# Patient Record
Sex: Female | Born: 1970 | Hispanic: No | Smoking: Current every day smoker
Health system: Southern US, Community
[De-identification: ages and names within clinical notes are randomized; demographics above are authoritative.]

## PROBLEM LIST (undated history)

## (undated) DIAGNOSIS — Z720 Tobacco use: Secondary | ICD-10-CM

## (undated) DIAGNOSIS — E119 Type 2 diabetes mellitus without complications: Secondary | ICD-10-CM

## (undated) DIAGNOSIS — F419 Anxiety disorder, unspecified: Secondary | ICD-10-CM

## (undated) DIAGNOSIS — I2510 Atherosclerotic heart disease of native coronary artery without angina pectoris: Secondary | ICD-10-CM

## (undated) DIAGNOSIS — I219 Acute myocardial infarction, unspecified: Secondary | ICD-10-CM

## (undated) DIAGNOSIS — E785 Hyperlipidemia, unspecified: Secondary | ICD-10-CM

## (undated) HISTORY — PX: CARDIAC CATHETERIZATION: SHX172

## (undated) HISTORY — PX: OTHER SURGICAL HISTORY: SHX169

---

## 2019-03-23 ENCOUNTER — Ambulatory Visit (HOSPITAL_COMMUNITY): Admit: 2019-03-23 | Payer: Medicaid Other | Admitting: Cardiovascular Disease

## 2019-03-23 ENCOUNTER — Encounter (HOSPITAL_COMMUNITY): Payer: Self-pay | Admitting: Cardiovascular Disease

## 2019-03-23 ENCOUNTER — Emergency Department (HOSPITAL_COMMUNITY): Payer: Medicaid Other

## 2019-03-23 ENCOUNTER — Inpatient Hospital Stay (HOSPITAL_COMMUNITY)
Admission: EM | Admit: 2019-03-23 | Discharge: 2019-03-30 | DRG: 234 | Disposition: A | Discharge status: Rehabilitation Facility | Payer: Medicaid Other | Attending: Cardiothoracic Surgery | Admitting: Cardiothoracic Surgery

## 2019-03-23 ENCOUNTER — Encounter (HOSPITAL_COMMUNITY)
Admission: EM | Disposition: A | Discharge status: Rehabilitation Facility | Payer: Self-pay | Source: Home / Self Care | Attending: Cardiothoracic Surgery

## 2019-03-23 ENCOUNTER — Inpatient Hospital Stay (HOSPITAL_COMMUNITY): Payer: Medicaid Other

## 2019-03-23 ENCOUNTER — Other Ambulatory Visit: Payer: Self-pay | Admitting: *Deleted

## 2019-03-23 DIAGNOSIS — I97821 Postprocedural cerebrovascular infarction during other surgery: Secondary | ICD-10-CM | POA: Diagnosis not present

## 2019-03-23 DIAGNOSIS — Z4659 Encounter for fitting and adjustment of other gastrointestinal appliance and device: Secondary | ICD-10-CM

## 2019-03-23 DIAGNOSIS — F17210 Nicotine dependence, cigarettes, uncomplicated: Secondary | ICD-10-CM | POA: Diagnosis present

## 2019-03-23 DIAGNOSIS — Z20828 Contact with and (suspected) exposure to other viral communicable diseases: Secondary | ICD-10-CM | POA: Diagnosis present

## 2019-03-23 DIAGNOSIS — M7989 Other specified soft tissue disorders: Secondary | ICD-10-CM | POA: Diagnosis not present

## 2019-03-23 DIAGNOSIS — I2510 Atherosclerotic heart disease of native coronary artery without angina pectoris: Secondary | ICD-10-CM

## 2019-03-23 DIAGNOSIS — Z951 Presence of aortocoronary bypass graft: Secondary | ICD-10-CM

## 2019-03-23 DIAGNOSIS — I82A12 Acute embolism and thrombosis of left axillary vein: Secondary | ICD-10-CM | POA: Diagnosis not present

## 2019-03-23 DIAGNOSIS — G8191 Hemiplegia, unspecified affecting right dominant side: Secondary | ICD-10-CM | POA: Diagnosis not present

## 2019-03-23 DIAGNOSIS — Z419 Encounter for procedure for purposes other than remedying health state, unspecified: Secondary | ICD-10-CM

## 2019-03-23 DIAGNOSIS — R7309 Other abnormal glucose: Secondary | ICD-10-CM | POA: Diagnosis not present

## 2019-03-23 DIAGNOSIS — E669 Obesity, unspecified: Secondary | ICD-10-CM | POA: Diagnosis present

## 2019-03-23 DIAGNOSIS — E6609 Other obesity due to excess calories: Secondary | ICD-10-CM | POA: Diagnosis not present

## 2019-03-23 DIAGNOSIS — E1151 Type 2 diabetes mellitus with diabetic peripheral angiopathy without gangrene: Secondary | ICD-10-CM | POA: Diagnosis present

## 2019-03-23 DIAGNOSIS — E8809 Other disorders of plasma-protein metabolism, not elsewhere classified: Secondary | ICD-10-CM | POA: Diagnosis not present

## 2019-03-23 DIAGNOSIS — R0683 Snoring: Secondary | ICD-10-CM | POA: Diagnosis not present

## 2019-03-23 DIAGNOSIS — I2119 ST elevation (STEMI) myocardial infarction involving other coronary artery of inferior wall: Secondary | ICD-10-CM

## 2019-03-23 DIAGNOSIS — Z6832 Body mass index (BMI) 32.0-32.9, adult: Secondary | ICD-10-CM

## 2019-03-23 DIAGNOSIS — R52 Pain, unspecified: Secondary | ICD-10-CM | POA: Diagnosis not present

## 2019-03-23 DIAGNOSIS — K5901 Slow transit constipation: Secondary | ICD-10-CM | POA: Diagnosis not present

## 2019-03-23 DIAGNOSIS — Z8249 Family history of ischemic heart disease and other diseases of the circulatory system: Secondary | ICD-10-CM

## 2019-03-23 DIAGNOSIS — I25110 Atherosclerotic heart disease of native coronary artery with unstable angina pectoris: Secondary | ICD-10-CM | POA: Diagnosis not present

## 2019-03-23 DIAGNOSIS — Z01810 Encounter for preprocedural cardiovascular examination: Secondary | ICD-10-CM

## 2019-03-23 DIAGNOSIS — E1142 Type 2 diabetes mellitus with diabetic polyneuropathy: Secondary | ICD-10-CM | POA: Diagnosis present

## 2019-03-23 DIAGNOSIS — R079 Chest pain, unspecified: Secondary | ICD-10-CM | POA: Diagnosis present

## 2019-03-23 DIAGNOSIS — I639 Cerebral infarction, unspecified: Secondary | ICD-10-CM

## 2019-03-23 DIAGNOSIS — I69319 Unspecified symptoms and signs involving cognitive functions following cerebral infarction: Secondary | ICD-10-CM

## 2019-03-23 DIAGNOSIS — D62 Acute posthemorrhagic anemia: Secondary | ICD-10-CM

## 2019-03-23 DIAGNOSIS — R4189 Other symptoms and signs involving cognitive functions and awareness: Secondary | ICD-10-CM | POA: Diagnosis present

## 2019-03-23 DIAGNOSIS — E785 Hyperlipidemia, unspecified: Secondary | ICD-10-CM | POA: Diagnosis present

## 2019-03-23 DIAGNOSIS — R609 Edema, unspecified: Secondary | ICD-10-CM | POA: Diagnosis not present

## 2019-03-23 DIAGNOSIS — I10 Essential (primary) hypertension: Secondary | ICD-10-CM

## 2019-03-23 DIAGNOSIS — E118 Type 2 diabetes mellitus with unspecified complications: Secondary | ICD-10-CM | POA: Diagnosis not present

## 2019-03-23 DIAGNOSIS — G479 Sleep disorder, unspecified: Secondary | ICD-10-CM | POA: Diagnosis not present

## 2019-03-23 DIAGNOSIS — I6300 Cerebral infarction due to thrombosis of unspecified precerebral artery: Secondary | ICD-10-CM | POA: Diagnosis not present

## 2019-03-23 DIAGNOSIS — E1165 Type 2 diabetes mellitus with hyperglycemia: Secondary | ICD-10-CM | POA: Diagnosis not present

## 2019-03-23 DIAGNOSIS — Z794 Long term (current) use of insulin: Secondary | ICD-10-CM | POA: Diagnosis not present

## 2019-03-23 DIAGNOSIS — D696 Thrombocytopenia, unspecified: Secondary | ICD-10-CM | POA: Diagnosis not present

## 2019-03-23 DIAGNOSIS — Z720 Tobacco use: Secondary | ICD-10-CM | POA: Diagnosis not present

## 2019-03-23 DIAGNOSIS — E46 Unspecified protein-calorie malnutrition: Secondary | ICD-10-CM | POA: Diagnosis not present

## 2019-03-23 DIAGNOSIS — I219 Acute myocardial infarction, unspecified: Secondary | ICD-10-CM | POA: Diagnosis not present

## 2019-03-23 DIAGNOSIS — R252 Cramp and spasm: Secondary | ICD-10-CM | POA: Diagnosis not present

## 2019-03-23 DIAGNOSIS — E871 Hypo-osmolality and hyponatremia: Secondary | ICD-10-CM | POA: Diagnosis not present

## 2019-03-23 HISTORY — PX: LEFT HEART CATH AND CORONARY ANGIOGRAPHY: CATH118249

## 2019-03-23 HISTORY — DX: Acute myocardial infarction, unspecified: I21.9

## 2019-03-23 HISTORY — DX: Atherosclerotic heart disease of native coronary artery without angina pectoris: I25.10

## 2019-03-23 HISTORY — DX: Anxiety disorder, unspecified: F41.9

## 2019-03-23 HISTORY — DX: Tobacco use: Z72.0

## 2019-03-23 HISTORY — DX: Type 2 diabetes mellitus without complications: E11.9

## 2019-03-23 HISTORY — DX: Hyperlipidemia, unspecified: E78.5

## 2019-03-23 LAB — LIPID PANEL
Cholesterol: 276 mg/dL — ABNORMAL HIGH (ref 0–200)
HDL: 35 mg/dL — ABNORMAL LOW (ref >40)
LDL Cholesterol: 201 mg/dL — ABNORMAL HIGH (ref 0–99)
Total CHOL/HDL Ratio: 7.9 RATIO
Triglycerides: 200 mg/dL — ABNORMAL HIGH (ref <150)
VLDL: 40 mg/dL (ref 0–40)

## 2019-03-23 LAB — URINALYSIS, ROUTINE W REFLEX MICROSCOPIC
Bilirubin Urine: NEGATIVE
Glucose, UA: >=500 mg/dL — AB
Hgb urine dipstick: NEGATIVE
Ketones, ur: 5 mg/dL — AB
Nitrite: NEGATIVE
Protein, ur: NEGATIVE mg/dL
Specific Gravity, Urine: >1.046 — ABNORMAL HIGH (ref 1.005–1.030)
pH: 7 (ref 5.0–8.0)

## 2019-03-23 LAB — COMPREHENSIVE METABOLIC PANEL
ALT: 17 U/L (ref 0–44)
ALT: 18 U/L (ref 0–44)
AST: 16 U/L (ref 15–41)
AST: 33 U/L (ref 15–41)
Albumin: 3.7 g/dL (ref 3.5–5.0)
Albumin: 3.5 g/dL (ref 3.5–5.0)
Alkaline Phosphatase: 83 U/L (ref 38–126)
Alkaline Phosphatase: 80 U/L (ref 38–126)
Anion gap: 12 (ref 5–15)
Anion gap: 13 (ref 5–15)
BUN: 16 mg/dL (ref 6–20)
BUN: 15 mg/dL (ref 6–20)
CO2: 19 mmol/L — ABNORMAL LOW (ref 22–32)
CO2: 19 mmol/L — ABNORMAL LOW (ref 22–32)
Calcium: 8.9 mg/dL (ref 8.9–10.3)
Calcium: 8.8 mg/dL — ABNORMAL LOW (ref 8.9–10.3)
Chloride: 100 mmol/L (ref 98–111)
Chloride: 103 mmol/L (ref 98–111)
Creatinine, Ser: 0.73 mg/dL (ref 0.44–1.00)
Creatinine, Ser: 0.64 mg/dL (ref 0.44–1.00)
GFR calc Af Amer: >60 mL/min (ref >60)
GFR calc Af Amer: >60 mL/min (ref >60)
GFR calc non Af Amer: >60 mL/min (ref >60)
GFR calc non Af Amer: >60 mL/min (ref >60)
Glucose, Bld: 338 mg/dL — ABNORMAL HIGH (ref 70–99)
Glucose, Bld: 266 mg/dL — ABNORMAL HIGH (ref 70–99)
Potassium: 4.3 mmol/L (ref 3.5–5.1)
Potassium: 4.5 mmol/L (ref 3.5–5.1)
Sodium: 131 mmol/L — ABNORMAL LOW (ref 135–145)
Sodium: 135 mmol/L (ref 135–145)
Total Bilirubin: 0.3 mg/dL (ref 0.3–1.2)
Total Bilirubin: 0.4 mg/dL (ref 0.3–1.2)
Total Protein: 6.5 g/dL (ref 6.5–8.1)
Total Protein: 6.2 g/dL — ABNORMAL LOW (ref 6.5–8.1)

## 2019-03-23 LAB — CBC
HCT: 41.8 % (ref 36.0–46.0)
Hemoglobin: 14.5 g/dL (ref 12.0–15.0)
MCH: 28.8 pg (ref 26.0–34.0)
MCHC: 34.7 g/dL (ref 30.0–36.0)
MCV: 82.9 fL (ref 80.0–100.0)
Platelets: 344 10*3/uL (ref 150–400)
RBC: 5.04 MIL/uL (ref 3.87–5.11)
RDW: 12.7 % (ref 11.5–15.5)
WBC: 14.3 10*3/uL — ABNORMAL HIGH (ref 4.0–10.5)
nRBC: 0 % (ref 0.0–0.2)

## 2019-03-23 LAB — APTT: aPTT: 27 seconds (ref 24–36)

## 2019-03-23 LAB — GLUCOSE, CAPILLARY
Glucose-Capillary: 314 mg/dL — ABNORMAL HIGH (ref 70–99)
Glucose-Capillary: 194 mg/dL — ABNORMAL HIGH (ref 70–99)
Glucose-Capillary: 279 mg/dL — ABNORMAL HIGH (ref 70–99)
Glucose-Capillary: 206 mg/dL — ABNORMAL HIGH (ref 70–99)

## 2019-03-23 LAB — POCT I-STAT, CHEM 8
BUN: 17 mg/dL (ref 6–20)
Calcium, Ion: 1.22 mmol/L (ref 1.15–1.40)
Chloride: 101 mmol/L (ref 98–111)
Creatinine, Ser: 0.5 mg/dL (ref 0.44–1.00)
Glucose, Bld: 354 mg/dL — ABNORMAL HIGH (ref 70–99)
HCT: 44 % (ref 36.0–46.0)
Hemoglobin: 15 g/dL (ref 12.0–15.0)
Potassium: 4.5 mmol/L (ref 3.5–5.1)
Sodium: 134 mmol/L — ABNORMAL LOW (ref 135–145)
TCO2: 22 mmol/L (ref 22–32)

## 2019-03-23 LAB — PROTIME-INR
INR: 1 (ref 0.8–1.2)
Prothrombin Time: 13.1 seconds (ref 11.4–15.2)

## 2019-03-23 LAB — SURGICAL PCR SCREEN
MRSA, PCR: NEGATIVE
Staphylococcus aureus: NEGATIVE

## 2019-03-23 LAB — TROPONIN I (HIGH SENSITIVITY): Troponin I (High Sensitivity): 24 ng/L — ABNORMAL HIGH (ref <18)

## 2019-03-23 LAB — ECHOCARDIOGRAM COMPLETE: Weight: 2848 oz

## 2019-03-23 LAB — SARS CORONAVIRUS 2 BY RT PCR (HOSPITAL ORDER, PERFORMED IN ~~LOC~~ HOSPITAL LAB): SARS Coronavirus 2: NEGATIVE

## 2019-03-23 LAB — HEMOGLOBIN A1C
Hgb A1c MFr Bld: 8.3 % — ABNORMAL HIGH (ref 4.8–5.6)
Hgb A1c MFr Bld: 8.3 % — ABNORMAL HIGH (ref 4.8–5.6)
Mean Plasma Glucose: 191.51 mg/dL
Mean Plasma Glucose: 191.51 mg/dL

## 2019-03-23 LAB — TSH: TSH: 1.094 u[IU]/mL (ref 0.350–4.500)

## 2019-03-23 LAB — MAGNESIUM: Magnesium: 1.8 mg/dL (ref 1.7–2.4)

## 2019-03-23 LAB — ABO/RH: ABO/RH(D): O NEG

## 2019-03-23 LAB — HEPARIN LEVEL (UNFRACTIONATED): Heparin Unfractionated: <0.1 IU/mL — ABNORMAL LOW (ref 0.30–0.70)

## 2019-03-23 SURGERY — LEFT HEART CATH AND CORONARY ANGIOGRAPHY
Anesthesia: LOCAL

## 2019-03-23 MED ORDER — ALPRAZOLAM 0.5 MG PO TABS
0.5000 mg | ORAL_TABLET | Freq: Three times a day (TID) | ORAL | Status: DC | PRN
  Administered 2019-03-23 – 2019-03-24 (×4): 0.5 mg via ORAL
  Filled 2019-03-23 (×4): qty 1

## 2019-03-23 MED ORDER — INSULIN ASPART 100 UNIT/ML ~~LOC~~ SOLN
0.0000 [IU] | Freq: Every day | SUBCUTANEOUS | Status: DC
  Administered 2019-03-23 – 2019-03-24 (×2): 2 [IU] via SUBCUTANEOUS

## 2019-03-23 MED ORDER — SODIUM CHLORIDE 0.9 % IV SOLN
0.7500 ug/kg/min | INTRAVENOUS | Status: DC
  Administered 2019-03-24: 0.75 ug/kg/min via INTRAVENOUS
  Filled 2019-03-23 (×2): qty 50

## 2019-03-23 MED ORDER — LIDOCAINE HCL (PF) 1 % IJ SOLN
INTRAMUSCULAR | Status: AC
  Filled 2019-03-23: qty 30

## 2019-03-23 MED ORDER — NITROGLYCERIN 1 MG/10 ML FOR IR/CATH LAB
INTRA_ARTERIAL | Status: AC
  Filled 2019-03-23: qty 10

## 2019-03-23 MED ORDER — HEPARIN SODIUM (PORCINE) 1000 UNIT/ML IJ SOLN
INTRAMUSCULAR | Status: AC
  Filled 2019-03-23: qty 1

## 2019-03-23 MED ORDER — FENTANYL CITRATE (PF) 100 MCG/2ML IJ SOLN
INTRAMUSCULAR | Status: DC | PRN
  Administered 2019-03-23 (×2): 25 ug via INTRAVENOUS

## 2019-03-23 MED ORDER — HEPARIN (PORCINE) IN NACL 1000-0.9 UT/500ML-% IV SOLN
INTRAVENOUS | Status: AC
  Filled 2019-03-23: qty 1000

## 2019-03-23 MED ORDER — MOMETASONE FURO-FORMOTEROL FUM 100-5 MCG/ACT IN AERO
2.0000 | INHALATION_SPRAY | Freq: Two times a day (BID) | RESPIRATORY_TRACT | Status: DC
  Administered 2019-03-23 – 2019-03-25 (×4): 2 via RESPIRATORY_TRACT
  Filled 2019-03-23: qty 8.8

## 2019-03-23 MED ORDER — CANGRELOR TETRASODIUM 50 MG IV SOLR
INTRAVENOUS | Status: AC
  Filled 2019-03-23: qty 50

## 2019-03-23 MED ORDER — MIDAZOLAM HCL 2 MG/2ML IJ SOLN
INTRAMUSCULAR | Status: DC | PRN
  Administered 2019-03-23 (×2): 1 mg via INTRAVENOUS

## 2019-03-23 MED ORDER — IOHEXOL 350 MG/ML SOLN
INTRAVENOUS | Status: DC | PRN
  Administered 2019-03-23: 80 mL via INTRACARDIAC

## 2019-03-23 MED ORDER — CANGRELOR BOLUS VIA INFUSION
INTRAVENOUS | Status: DC | PRN
  Administered 2019-03-23: 2421 ug via INTRAVENOUS

## 2019-03-23 MED ORDER — NICOTINE 14 MG/24HR TD PT24
14.0000 mg | MEDICATED_PATCH | Freq: Every day | TRANSDERMAL | Status: DC
  Administered 2019-03-23 – 2019-03-24 (×2): 14 mg via TRANSDERMAL
  Filled 2019-03-23 (×2): qty 1

## 2019-03-23 MED ORDER — SODIUM CHLORIDE 0.9% FLUSH: 3.0000 mL | INTRAVENOUS | Status: DC | PRN

## 2019-03-23 MED ORDER — VERAPAMIL HCL 2.5 MG/ML IV SOLN
INTRAVENOUS | Status: DC | PRN
  Administered 2019-03-23: 10 mL via INTRA_ARTERIAL

## 2019-03-23 MED ORDER — INSULIN ASPART 100 UNIT/ML ~~LOC~~ SOLN
0.0000 [IU] | SUBCUTANEOUS | Status: DC
  Administered 2019-03-23: 8 [IU] via SUBCUTANEOUS

## 2019-03-23 MED ORDER — ATORVASTATIN CALCIUM 80 MG PO TABS
80.0000 mg | ORAL_TABLET | Freq: Every day | ORAL | Status: DC
  Administered 2019-03-23 – 2019-03-24 (×2): 80 mg via ORAL
  Filled 2019-03-23 (×2): qty 1

## 2019-03-23 MED ORDER — ACETAMINOPHEN 325 MG PO TABS: 650.0000 mg | ORAL_TABLET | ORAL | Status: DC | PRN

## 2019-03-23 MED ORDER — ASPIRIN EC 81 MG PO TBEC
81.0000 mg | DELAYED_RELEASE_TABLET | Freq: Every day | ORAL | Status: DC
  Administered 2019-03-24: 11:00:00 81 mg via ORAL
  Filled 2019-03-23 (×2): qty 1

## 2019-03-23 MED ORDER — MIDAZOLAM HCL 2 MG/2ML IJ SOLN
INTRAMUSCULAR | Status: AC
  Filled 2019-03-23: qty 2

## 2019-03-23 MED ORDER — HYDRALAZINE HCL 20 MG/ML IJ SOLN: 10.0000 mg | INTRAMUSCULAR | Status: AC | PRN

## 2019-03-23 MED ORDER — INSULIN GLARGINE 100 UNIT/ML ~~LOC~~ SOLN
18.0000 [IU] | Freq: Two times a day (BID) | SUBCUTANEOUS | Status: DC
  Administered 2019-03-24 (×2): 18 [IU] via SUBCUTANEOUS
  Filled 2019-03-23 (×4): qty 0.18

## 2019-03-23 MED ORDER — MOMETASONE FURO-FORMOTEROL FUM 100-5 MCG/ACT IN AERO: 2.0000 | INHALATION_SPRAY | Freq: Two times a day (BID) | RESPIRATORY_TRACT | Status: DC

## 2019-03-23 MED ORDER — LABETALOL HCL 5 MG/ML IV SOLN: 10.0000 mg | INTRAVENOUS | Status: AC | PRN

## 2019-03-23 MED ORDER — MORPHINE SULFATE (PF) 2 MG/ML IV SOLN
2.0000 mg | INTRAVENOUS | Status: DC | PRN
  Administered 2019-03-23 – 2019-03-24 (×2): 2 mg via INTRAVENOUS
  Filled 2019-03-23 (×2): qty 1

## 2019-03-23 MED ORDER — SODIUM CHLORIDE 0.9 % IV SOLN
INTRAVENOUS | Status: AC | PRN
  Administered 2019-03-23: 4 ug/kg/min via INTRAVENOUS

## 2019-03-23 MED ORDER — SODIUM CHLORIDE 0.9 % IV SOLN: INTRAVENOUS | Status: AC

## 2019-03-23 MED ORDER — GABAPENTIN 300 MG PO CAPS
300.0000 mg | ORAL_CAPSULE | Freq: Two times a day (BID) | ORAL | Status: DC
  Administered 2019-03-24 (×2): 300 mg via ORAL
  Filled 2019-03-23 (×3): qty 1

## 2019-03-23 MED ORDER — HEPARIN SODIUM (PORCINE) 1000 UNIT/ML IJ SOLN
INTRAMUSCULAR | Status: DC | PRN
  Administered 2019-03-23: 10000 [IU] via INTRAVENOUS

## 2019-03-23 MED ORDER — FENTANYL CITRATE (PF) 100 MCG/2ML IJ SOLN
INTRAMUSCULAR | Status: AC
  Filled 2019-03-23: qty 2

## 2019-03-23 MED ORDER — CANGRELOR BOLUS VIA INFUSION: INTRAVENOUS | Status: DC | PRN

## 2019-03-23 MED ORDER — ONDANSETRON HCL 4 MG/2ML IJ SOLN: 4.0000 mg | Freq: Four times a day (QID) | INTRAMUSCULAR | Status: DC | PRN

## 2019-03-23 MED ORDER — HEPARIN (PORCINE) 25000 UT/250ML-% IV SOLN
1400.0000 [IU]/h | INTRAVENOUS | Status: DC
  Administered 2019-03-23: 1000 [IU]/h via INTRAVENOUS
  Administered 2019-03-24 – 2019-03-25 (×2): 1400 [IU]/h via INTRAVENOUS
  Filled 2019-03-23 (×3): qty 250

## 2019-03-23 MED ORDER — VERAPAMIL HCL 2.5 MG/ML IV SOLN
INTRAVENOUS | Status: AC
  Filled 2019-03-23: qty 2

## 2019-03-23 MED ORDER — HEPARIN (PORCINE) IN NACL 1000-0.9 UT/500ML-% IV SOLN
INTRAVENOUS | Status: DC | PRN
  Administered 2019-03-23 (×2): 500 mL

## 2019-03-23 MED ORDER — SODIUM CHLORIDE 0.9% FLUSH
3.0000 mL | Freq: Two times a day (BID) | INTRAVENOUS | Status: DC
  Administered 2019-03-23 (×2): 3 mL via INTRAVENOUS

## 2019-03-23 MED ORDER — SODIUM CHLORIDE 0.9 % IV SOLN
INTRAVENOUS | Status: AC | PRN
  Administered 2019-03-23: 10 mL/h via INTRAVENOUS

## 2019-03-23 MED ORDER — INSULIN ASPART 100 UNIT/ML ~~LOC~~ SOLN
0.0000 [IU] | Freq: Three times a day (TID) | SUBCUTANEOUS | Status: DC
  Administered 2019-03-24 (×2): 5 [IU] via SUBCUTANEOUS
  Administered 2019-03-24: 8 [IU] via SUBCUTANEOUS
  Administered 2019-03-24 – 2019-03-25 (×2): 5 [IU] via SUBCUTANEOUS

## 2019-03-23 MED ORDER — LIDOCAINE HCL (PF) 1 % IJ SOLN
INTRAMUSCULAR | Status: DC | PRN
  Administered 2019-03-23: 2 mL via INTRADERMAL

## 2019-03-23 MED ORDER — SODIUM CHLORIDE 0.9 % IV SOLN: 0.7500 ug/kg/min | INTRAVENOUS | Status: DC

## 2019-03-23 MED ORDER — INSULIN GLARGINE 100 UNIT/ML ~~LOC~~ SOLN
14.0000 [IU] | Freq: Two times a day (BID) | SUBCUTANEOUS | Status: DC
  Filled 2019-03-23: qty 0.14

## 2019-03-23 MED ORDER — SODIUM CHLORIDE 0.9 % IV SOLN: 250.0000 mL | INTRAVENOUS | Status: DC | PRN

## 2019-03-23 MED ORDER — BIVALIRUDIN BOLUS VIA INFUSION - CUPID: INTRAVENOUS | Status: DC | PRN

## 2019-03-23 SURGICAL SUPPLY — 13 items
CATH 5FR JL3.5 JR4 ANG PIG MP (CATHETERS) ×1 IMPLANT
DEVICE RAD COMP TR BAND LRG (VASCULAR PRODUCTS) ×1 IMPLANT
GLIDESHEATH SLEND SS 6F .021 (SHEATH) ×1 IMPLANT
GUIDEWIRE INQWIRE 1.5J.035X260 (WIRE) IMPLANT
INQWIRE 1.5J .035X260CM (WIRE) ×2
KIT ENCORE 26 ADVANTAGE (KITS) IMPLANT
KIT HEART LEFT (KITS) ×2 IMPLANT
PACK CARDIAC CATHETERIZATION (CUSTOM PROCEDURE TRAY) ×2 IMPLANT
SYR MEDRAD MARK 7 150ML (SYRINGE) ×2 IMPLANT
TRANSDUCER W/STOPCOCK (MISCELLANEOUS) ×2 IMPLANT
TUBING CIL FLEX 10 FLL-RA (TUBING) ×2 IMPLANT
WIRE COUGAR XT STRL 190CM (WIRE) IMPLANT
WIRE HI TORQ VERSACORE-J 145CM (WIRE) ×1 IMPLANT

## 2019-03-23 NOTE — Progress Notes (Signed)
ANTICOAGULATION CONSULT NOTE - Initial Consult  Pharmacy Consult for heparin Indication: chest pain/ACS  No Known Allergies  Patient Measurements: Height: 5\' 2"  (157.5 cm) Weight: 173 lb 4.5 oz (78.6 kg) IBW/kg (Calculated) : 50.1 Heparin Dosing Weight: 67kg  Vital Signs: BP: 118/67 (11/04 1200) Pulse Rate: 89 (11/04 1115)  Labs: Recent Labs    03/23/19 0753  HGB 14.5  HCT 41.8  PLT 344  APTT 27  LABPROT 13.1  INR 1.0  CREATININE 0.73  TROPONINIHS 24*    Estimated Creatinine Clearance: 82.6 mL/min (by C-G formula based on SCr of 0.73 mg/dL).   Medical History: Past Medical History:  Diagnosis Date  . Diabetes ([place])   . Hyperlipidemia   . Tobacco abuse    Assessment: 48 year old female s/p cath this morning found to have multivessel CAD. Patient started on cangrelor in lab due to poor distal flow. Heparin to start this afternoon.   Goal of Therapy:  Heparin level 0.3-0.7 units/ml Monitor platelets by anticoagulation protocol: Yes   Plan:  Start heparin infusion at 1000 units/hr - start 4 hours after TR band removed Check anti-Xa level in 6 hours and daily while on heparin Continue to monitor H&H and platelets  Follow plans for CVTS  Erin Hearing PharmD., BCPS Clinical Pharmacist 03/23/2019 12:32 PM

## 2019-03-23 NOTE — Progress Notes (Signed)
CT surgery  I went back to speak to the patient in the Cath Lab holding area after our initial conversation and she remains against having surgical coronary bypass grafting. I again reviewed the major aspects of the operative procedure benefits and risks.  She wishes to have more information about PCI as an option as she is very fearful and tearful of having sternotomy and CABG.  Have requested cardiology to revisit possible PCI for this patient.

## 2019-03-23 NOTE — Progress Notes (Signed)
ANTICOAGULATION CONSULT NOTE   Pharmacy Consult for Heparin Indication: chest pain/ACS, CABG 11/6  No Known Allergies  Patient Measurements: Height: 5\' 2"  (157.5 cm) Weight: 173 lb 4.5 oz (78.6 kg) IBW/kg (Calculated) : 50.1 Heparin Dosing Weight: 67kg  Vital Signs: Temp: 98.1 F (36.7 C) (11/04 2130) Temp Source: Oral (11/04 2130) BP: 92/58 (11/04 2300)  Labs: Recent Labs    03/23/19 0746 03/23/19 0753 03/23/19 1136 03/23/19 2213  HGB 15.0 14.5  --   --   HCT 44.0 41.8  --   --   PLT  --  344  --   --   APTT  --  27  --   --   LABPROT  --  13.1  --   --   INR  --  1.0  --   --   HEPARINUNFRC  --   --   --  <0.10*  CREATININE 0.50 0.73 0.64  --   TROPONINIHS  --  24*  --   --     Estimated Creatinine Clearance: 82.6 mL/min (by C-G formula based on SCr of 0.64 mg/dL).   Medical History: Past Medical History:  Diagnosis Date  . Diabetes ([place])   . Hyperlipidemia   . Tobacco abuse    Assessment: 48 year old female s/p cath this morning found to have multivessel CAD. Patient started on cangrelor in lab due to poor distal flow. Heparin to start this afternoon.   11/4 PM update:  Heparin level undetectable No issues per RN CABG 11/6  Goal of Therapy:  Heparin level 0.3-0.7 units/ml Monitor platelets by anticoagulation protocol: Yes   Plan:  Inc heparin to 1250 units/hr Re-check heparin level with AM labs  Narda Bonds, PharmD, [place] Pharmacist Phone: [phone]

## 2019-03-23 NOTE — H&P (Signed)
Cardiology Admission History and Physical:   Patient ID: Joan Webb MRN: [id number]; DOB: [birth year]   Admission date: 03/23/19  Primary Care Provider: No primary care provider on file. Primary Cardiologist: No primary care provider on file.  Primary Electrophysiologist:  None   Chief Complaint:  Chest pain  Patient Profile:   Joan Webb is a 48 y.o. female with history of DM and tobacco abuse who began having chest pain at 5:30 am today. She called EMS. EKG with inferior ST elevation. Code STEMI activated by EMS. Pt transported directly to the cath lab at [facility]. Pt with ongoing chest pain upon arrival to [facility].   History of Present Illness:    Joan Webb is a 48 y.o. female with history of DM and tobacco abuse who began having chest pain at 5:30 am today. She called EMS. EKG with inferior ST elevation. Code STEMI activated by EMS. Pt transported directly to the cath lab at [facility]. Pt with ongoing chest pain upon arrival to [facility].    Past Medical History:  Diagnosis Date  . Diabetes ([place])   . Hyperlipidemia   . Tobacco abuse     Past Surgical History:  Procedure Laterality Date  . None       Medications Prior to Admission: Janumet dose unknown Glipizide dose unknown  Allergies:   Not on File  Social History:   Social History   Socioeconomic History  . Marital status: Not on file    Spouse name: Not on file  . Number of children: Not on file  . Years of education: Not on file  . Highest education level: Not on file  Occupational History  . Not on file  Social Needs  . Financial resource strain: Not on file  . Food insecurity    Worry: Not on file    Inability: Not on file  . Transportation needs    Medical: Not on file    Non-medical: Not on file  Tobacco Use  . Smoking status: Current Every Day Smoker    Types: Cigarettes  Substance and Sexual Activity  . Alcohol use: Not Currently  . Drug use: Not Currently  . Sexual activity: Not on file  Lifestyle   . Physical activity    Days per week: Not on file    Minutes per session: Not on file  . Stress: Not on file  Relationships  . Social Herbalist on phone: Not on file    Gets together: Not on file    Attends religious service: Not on file    Active member of club or organization: Not on file    Attends meetings of clubs or organizations: Not on file    Relationship status: Not on file  . Intimate partner violence    Fear of current or ex partner: Not on file    Emotionally abused: Not on file    Physically abused: Not on file    Forced sexual activity: Not on file  Other Topics Concern  . Not on file  Social History Narrative  . Not on file    Family History:   The patient's family history includes Heart attack in her father.    ROS:  Please see the history of present illness.  All other ROS reviewed and negative.     Physical Exam/Data:   115/71  85 bpm. RR 16 General:  Well nourished, well developed, appears uncomfortable HEENT: normal Lymph: no adenopathy Neck: no JVD Endocrine:  No  thryomegaly Vascular: No carotid bruits; FA pulses 2+ bilaterally without bruits  Cardiac:  normal S1, S2; RRR; no murmur  Lungs:  clear to auscultation bilaterally, no wheezing, rhonchi or rales  Abd: soft, nontender, no hepatomegaly  Ext: no LE edema Musculoskeletal:  No deformities, BUE and BLE strength normal and equal Skin: warm and dry  Neuro:  CNs 2-12 intact, no focal abnormalities noted Psych:  Normal affect    EKG:  The ECG that was done was personally reviewed and demonstrates sinus with inferior ST elevation  Relevant CV Studies: None  Laboratory Data:  High Sensitivity Troponin:  No results for input(s): TROPONINIHS in the last 720 hours.    ChemistryNo results for input(s): NA, K, CL, CO2, GLUCOSE, BUN, CREATININE, CALCIUM, GFRNONAA, GFRAA, ANIONGAP in the last 168 hours.  No results for input(s): PROT, ALBUMIN, AST, ALT, ALKPHOS, BILITOT in the last  168 hours. HematologyNo results for input(s): WBC, RBC, HGB, HCT, MCV, MCH, MCHC, RDW, PLT in the last 168 hours. BNPNo results for input(s): BNP, PROBNP in the last 168 hours.  DDimer No results for input(s): DDIMER in the last 168 hours.   Radiology/Studies:  No results found.  Assessment and Plan:   1. Acute inferior ST elevation MI: Will plan emergent cardiac cath. Further plans to follow post cath.   Severity of Illness: The appropriate patient status for this patient is INPATIENT. Inpatient status is judged to be reasonable and necessary in order to provide the required intensity of service to ensure the patient's safety. The patient's presenting symptoms, physical exam findings, and initial radiographic and laboratory data in the context of their chronic comorbidities is felt to place them at high risk for further clinical deterioration. Furthermore, it is not anticipated that the patient will be medically stable for discharge from the hospital within 2 midnights of admission. The following factors support the patient status of inpatient.   " The patient's presenting symptoms include chest pain. " The worrisome physical exam findings include  " The initial radiographic and laboratory data are worrisome because of ST elevation on EKG " The chronic co-morbidities include DM, tobacco abuse, HLD   * I certify that at the point of admission it is my clinical judgment that the patient will require inpatient hospital care spanning beyond 2 midnights from the point of admission due to high intensity of service, high risk for further deterioration and high frequency of surveillance required.*    For questions or updates, please contact [facility] Please consult [email or web address] for contact info under        Signed, Verne Carrow, MD  03/23/2019 7:36 AM

## 2019-03-23 NOTE — Progress Notes (Signed)
Pre-CABG Dopplers completed. Refer to "CV Proc" under chart review to view preliminary results.  03/23/2019 11:06 AM Kelby Aline., MHA, RVT, RDCS, RDMS

## 2019-03-23 NOTE — Progress Notes (Signed)
Chaplain provided prayer and spiritual presence to Joan Webb who expressed quite a bit of anxiety about surgery and being in the hospital.  Chaplain worked to provide assurance and comfort.  Joan Webb would like to leave and come back to hospital for her surgery, but chaplain worked to tell her the importance of staying and being monitored.  Chaplain will follow-up.

## 2019-03-23 NOTE — Progress Notes (Signed)
Having runs of VT Will repage cards. Is now rethinking surgery and wants to speak to Dr Prescott Gum.

## 2019-03-23 NOTE — Plan of Care (Signed)
Having difficulty coping with diagnosis and surgical intervention, after speaking a second time with Dr Prescott Gum is consenting to surgery. Surgery tentative for Friday  wukk be on Heparin and cangelor until then. Tightening BS until then.  ND Knowledge deficit related to cardiac disease processes impending surgery  Will increase knowledge base by introducing and continuing to reiterate the need to hospitalization with blood thinners due to critical 3 V disease and MI.  Introduce literature about surgical intervention and post operative  course to have patient know what to expect.

## 2019-03-23 NOTE — Progress Notes (Signed)
Explained to patient about medications and why they are changing  from her normal. Patient does not want to take insulin coverage at this time. bs 194. Will take nicoderm patch.  Having runs of VT 10-16 beats. States she does feel them. Cards aware.

## 2019-03-23 NOTE — Progress Notes (Signed)
Called to the bedside by nursing today. The patient is unsure why she will need bypass surgery. I have explained to her again that she has uncontrolled diabetes and multi-vessel CAD and that bypass is her best treatment option. Given history of poor compliance with medications and ongoing tobacco abuse, I do not think that multi-vessel stenting is an option.   She has been seen by CT surgery and planning is underway for bypass if she agrees.   She is stating she may leave AMA. I cannot stop her from doing this if she chooses. I have outlined the risk of death if she leaves the hospital without appropriate treatment of her CAD. She is also asking to go outside to see family but I have told her that I cannot allow this.   Lauree Chandler 03/23/2019 12:40 PM

## 2019-03-23 NOTE — Progress Notes (Signed)
Daughter in law took all rings except one silver colored ring .  With patients permission.  Son brought in patients pillow, robe pajamas and heating pad. , cell phone and charger at bedisde.  All cords without issues. Heating pad has cover on it. Patient knows she is responsible for all valuables.

## 2019-03-23 NOTE — Progress Notes (Signed)
  Echocardiogram 2D Echocardiogram has been performed.  Matilde Bash 03/23/2019, 10:07 AM

## 2019-03-23 NOTE — Consult Note (Signed)
[address]       [place] [address]             [phone]        Fontella Shan [facility] Medical Record #[id number] Date of Birth: 06-27-1970  Referring: No ref. provider found Primary Care: No primary care provider on file. Primary Cardiologist:No primary care provider on file.  Chief Complaint:   Chest pain  History of Present Illness:     Patient is a 48 year old diabetic smoker with family history of CAD who called 911 after she had persistent bilateral chest pain with physical exertion. Her EKG showed mild ST elevation in the inferior leads.  She was taken directly to the Cath Lab after her Covid screen.  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  Her chest pain resolved and she was admitted to the CCU.  Her cardiac enzymes were marginally elevated and her hemodynamic stable.  Is felt that the ST elevation was due to an occlusion of a small posterolateral branch of her dominant right coronary, probably not graftable or stsentable.  I saw the patient as an urgent consult in the Cath Lab and discussed the patient's care with her cardiologist.  We both thought that surgical revascularization would be indicated due to her multivessel disease, diabetes and acute presentation.  The patient however refused to have surgery at that point when she was clearly frightened and emotional.  Since that time the patient is discussed the situation again with her cardiologist and is willing to proceed with multivessel CABG.  She understands that she should benefit from this procedure with respect to preservation of LV function, improve survival, and improve symptoms.  She has been started on anticoagulation with heparin and Cangrelor.  She has stabilized and chest pain resolved.   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   Zubrod  Score: At the time of surgery this patient's most appropriate activity status/level should be described as:     0    Normal activity, no symptoms     1    Restricted in physical strenuous activity but ambulatory, able to do out light work     2    Ambulatory and capable of self care, unable to do work activities, up and about                 more than 50%  Of the time                                3    Only limited self care, in bed greater than 50% of waking hours     4    Completely disabled, no self care, confined to bed or chair     5    Moribund  Past Medical History:  Diagnosis Date  . Diabetes (HCC)   . Hyperlipidemia   . Tobacco abuse     Past Surgical History:  Procedure Laterality Date  . LEFT HEART CATH AND CORONARY ANGIOGRAPHY N/A 03/23/2019   Procedure: LEFT HEART CATH AND CORONARY ANGIOGRAPHY;  Surgeon: Kathleene Hazel, MD;  Location: [facility] INVASIVE CV LAB;  Service: Cardiovascular;  Laterality: N/A;  . None      Social History   Tobacco Use  Smoking Status  Current Every Day Smoker  . Types: Cigarettes    Social History   Substance and Sexual Activity  Alcohol Use Not Currently     No Known Allergies  Current Facility-Administered Medications  Medication Dose Route Frequency Provider Last Rate Last Dose  . 0.9 %  sodium chloride infusion  250 mL Intravenous PRN Kathleene Hazel, MD      . acetaminophen (TYLENOL) tablet 650 mg  650 mg Oral Q4H PRN Kathleene Hazel, MD      . ALPRAZolam Prudy Feeler) tablet 0.5 mg  0.5 mg Oral TID PRN Kerin Perna, MD      . Melene Muller ON 03/24/2019] aspirin EC tablet 81 mg  81 mg Oral Daily Kathleene Hazel, MD      . atorvastatin (LIPITOR) tablet 80 mg  80 mg Oral q1800 Kathleene Hazel, MD   80 mg at 03/23/19 1703  . cangrelor [facility]) 50 mg in sodium chloride 0.9 % 250 mL (0.2 mg/mL) infusion  0.75 mcg/kg/min Intravenous Continuous Kathleene Hazel, MD 17.69 mL/hr at 03/23/19  1420 0.75 mcg/kg/min at 03/23/19 1420  . gabapentin (NEURONTIN) capsule 300 mg  300 mg Oral BID Laverda Page B, NP      . heparin ADULT infusion 100 units/mL (25000 units/252mL sodium chloride 0.45%)  1,000 Units/hr Intravenous Continuous Earnie Larsson, RPH 10 mL/hr at 03/23/19 1709 1,000 Units/hr at 03/23/19 1709  . insulin aspart (novoLOG) injection 0-15 Units  0-15 Units Subcutaneous Q4H Kerin Perna, MD   8 Units at 03/23/19 1704  . mometasone-formoterol (DULERA) 100-5 MCG/ACT inhaler 2 puff  2 puff Inhalation BID Donata Clay, Theron Arista, MD      . morphine 2 MG/ML injection 2 mg  2 mg Intravenous Q1H PRN Kathleene Hazel, MD      . nicotine (NICODERM CQ - dosed in mg/24 hours) patch 14 mg  14 mg Transdermal Daily Donata Clay, Theron Arista, MD   14 mg at 03/23/19 1435  . ondansetron (ZOFRAN) injection 4 mg  4 mg Intravenous Q6H PRN Verne Carrow D, MD      . sodium chloride flush (NS) 0.9 % injection 3 mL  3 mL Intravenous Q12H Kathleene Hazel, MD   3 mL at 03/23/19 1213  . sodium chloride flush (NS) 0.9 % injection 3 mL  3 mL Intravenous PRN Kathleene Hazel, MD        Medications Prior to Admission  Medication Sig Dispense Refill Last Dose  . aspirin 81 MG chewable tablet Chew 81 mg by mouth daily.   Past Week at Unknown time  . Cholecalciferol (QC VITAMIN D3) 125 MCG (5000 UT) TABS Take 1 tablet by mouth daily.   Past Week at Unknown time  . gabapentin (NEURONTIN) 300 MG capsule Take 300 mg by mouth 2 (two) times daily.   Past Week at Unknown time  . glipiZIDE (GLUCOTROL) 10 MG tablet Take 10 mg by mouth 2 (two) times daily before a meal.    Past Week at Unknown time  . ibuprofen (ADVIL) 800 MG tablet Take 800 mg by mouth 2 (two) times daily as needed for mild pain.   Past Week at Unknown time  . lisinopril (ZESTRIL) 2.5 MG tablet Take 5 mg by mouth daily.    Past Week at Unknown time  . lovastatin (MEVACOR) 20 MG tablet Take 20 mg by mouth at bedtime.    Past Week  at Unknown time  . Omega 3 1000 MG CAPS Take 1,000 mg  by mouth daily.   Past Week at Unknown time  . sitaGLIPtin-metformin (JANUMET) 50-1000 MG tablet Take 1 tablet by mouth 2 (two) times daily with a meal.   Past Week at Unknown time    Family History  Problem Relation Age of Onset  . Heart attack Father      Review of Systems:   ROS      Cardiac Review of Systems: Y or  [    ]= no  Chest Pain [  y  ]  Resting SOB [   ] Exertional SOB  [  ]  Orthopnea [  ]   Pedal Edema [   ]    Palpitations [ y ] Syncope  [  ]   Presyncope [   ]  General Review of Systems: [Y] = yes [  ]=no Constitional: recent weight change [  ]; anorexia [  ]; fatigue [place]ienne  ]; nausea [  ]; night sweats [  ]; fever [  ]; or chills [  ]                                                               Dental: Last Dentist visit: Unknown  Eye : blurred vision [  ]; diplopia [   ]; vision changes [  ];  Amaurosis fugax[  ]; Resp: cough [ y ];  wheezing[  ];  hemoptysis[  ]; shortness of breath[  ]; paroxysmal nocturnal dyspnea[  ]; dyspnea on exertion[  ]; or orthopnea[  ];  GI:  gallstones[  ], vomiting[  ];  dysphagia[  ]; melena[  ];  hematochezia [  ]; heartburn[  ];   Hx of  Colonoscopy[  ]; GU: kidney stones [  ]; hematuria[  ];   dysuria [  ];  nocturia[  ];  history of     obstruction [  ]; urinary frequency [  ]             Skin: rash, swelling[  ];, hair loss[  ];  peripheral edema[  ];  or itching[  ]; Musculosketetal: myalgias[  ];  joint swelling[  ];  joint erythema[  ];  joint pain[  ];  back pain[  ]; positive for leg cramps  Heme/Lymph: bruising[  ];  bleeding[  ];  anemia[  ];  Neuro: TIA[  ];  headaches[  ];  stroke[  ];  vertigo[  ];  seizures[  ];   paresthesias[  ];  difficulty walking[  ];  Psych:depression[  ]; anxiety[ y ];  Endocrine: diabetes[ y ];  thyroid dysfunction[  ];           Physical Exam: BP 101/67 (BP Location: Left Leg)   Pulse 89   Resp 16   Ht 5\' 2"  (1.575 m)   Wt 78.6 kg    SpO2 99%   BMI 31.69 kg/m        Exam    General- alert and comfortable    Neck- no JVD, no cervical adenopathy palpable, no carotid bruit   Lungs- clear without rales, wheezes   Cor- regular rate and rhythm, no murmur , gallop   Abdomen- soft, non-tender   Extremities - warm, non-tender, minimal edema   Neuro- oriented, appropriate, no  focal weakness    Diagnostic Studies & Laboratory data:     Recent Radiology Findings:   Dg Chest Port 1 View  Result Date: 03/23/2019 CLINICAL DATA:  Acute myocardial infarction.  Chest pain. EXAM: PORTABLE CHEST 1 VIEW COMPARISON:  None. FINDINGS: The heart size and mediastinal contours are within normal limits. Both lungs are clear. The visualized skeletal structures are unremarkable. IMPRESSION: Normal exam. Electronically Signed   By: Lorriane Shire M.D.   On: 03/23/2019 11:53   Vas US Doppler Pre Cabg  Result Date: 03/23/2019 PREOPERATIVE VASCULAR EVALUATION  Indications:      Pre-surgical evaluation. Risk Factors:     Hyperlipidemia, Diabetes. Comparison Study: No prior study Performing Technologist: Maudry Mayhew MHA, RVT, RDCS, RDMS  Examination Guidelines: A complete evaluation includes B-mode imaging, spectral Doppler, color Doppler, and power Doppler as needed of all accessible portions of each vessel. Bilateral testing is considered an integral part of a complete examination. Limited examinations for reoccurring indications may be performed as noted.  Right Carotid Findings: +----------+--------+--------+--------+----------------------+--------+           PSV cm/sEDV cm/sStenosisDescribe              Comments +----------+--------+--------+--------+----------------------+--------+ CCA Prox  85      19                                             +----------+--------+--------+--------+----------------------+--------+ CCA Distal56      12              homogeneous and smooth          +----------+--------+--------+--------+----------------------+--------+ ICA Prox  90      23                                             +----------+--------+--------+--------+----------------------+--------+ ICA Distal69      25                                             +----------+--------+--------+--------+----------------------+--------+ ECA       120     14              smooth and homogeneous         +----------+--------+--------+--------+----------------------+--------+ Portions of this table do not appear on this page. +----------+--------+-------+----------------+------------+           PSV cm/sEDV cmsDescribe        Arm Pressure +----------+--------+-------+----------------+------------+ [id number]            Multiphasic, WNL             +----------+--------+-------+----------------+------------+ +---------+--------+--+--------+-+---------+ VertebralPSV cm/s32EDV cm/s5Antegrade +---------+--------+--+--------+-+---------+ Left Carotid Findings: +----------+--------+--------+--------+-----------------------+--------+           PSV cm/sEDV cm/sStenosisDescribe               Comments +----------+--------+--------+--------+-----------------------+--------+ CCA Prox  100     20                                              +----------+--------+--------+--------+-----------------------+--------+ CCA Distal87  20              smooth and homogeneous          +----------+--------+--------+--------+-----------------------+--------+ ICA Prox  73      21                                              +----------+--------+--------+--------+-----------------------+--------+ ICA Distal97      32                                              +----------+--------+--------+--------+-----------------------+--------+ ECA       144     15              smooth and heterogenous          +----------+--------+--------+--------+-----------------------+--------+ +----------+--------+--------+----------------+------------+ SubclavianPSV cm/sEDV cm/sDescribe        Arm Pressure +----------+--------+--------+----------------+------------+           106             Multiphasic, WNL86           +----------+--------+--------+----------------+------------+ +---------+--------+--+--------+--+---------+ VertebralPSV cm/s45EDV cm/s11Antegrade +---------+--------+--+--------+--+---------+  ABI Findings: +--------+------------------+-----+---------+----------------------------------+ Right   Rt Pressure (mmHg)IndexWaveform Comment                            +--------+------------------+-----+---------+----------------------------------+ Brachial                       triphasicUnable to obtain pressure due to                                           TR band                            +--------+------------------+-----+---------+----------------------------------+ PTA     84                0.98 triphasic                                   +--------+------------------+-----+---------+----------------------------------+ DP      92                1.07 triphasic                                   +--------+------------------+-----+---------+----------------------------------+ +--------+------------------+-----+---------+-------+ Left    Lt Pressure (mmHg)IndexWaveform Comment +--------+------------------+-----+---------+-------+ Brachial86                     triphasic        +--------+------------------+-----+---------+-------+ PTA     104               1.21 triphasic        +--------+------------------+-----+---------+-------+ DP      97                1.13 triphasic        +--------+------------------+-----+---------+-------+ +-------+---------------+----------------+ ABI/TBIToday's ABI/TBIPrevious ABI/TBI  +-------+---------------+----------------+ Right  1.07                            +-------+---------------+----------------+  Left   1.21                            +-------+---------------+----------------+  Right Doppler Findings: +-----------+--------+-----+---------+----------------------------------------+ Site       PressureIndexDoppler  Comments                                 +-----------+--------+-----+---------+----------------------------------------+ Brachial                triphasicUnable to obtain pressure due to TR band +-----------+--------+-----+---------+----------------------------------------+ Radial                  triphasic                                         +-----------+--------+-----+---------+----------------------------------------+ Ulnar                   triphasic                                         +-----------+--------+-----+---------+----------------------------------------+ Palmar Arch                      Unable to evaluate due to TR band        +-----------+--------+-----+---------+----------------------------------------+  Left Doppler Findings: +-----------+--------+-----+---------+-----------------------------------------+ Site       PressureIndexDoppler  Comments                                  +-----------+--------+-----+---------+-----------------------------------------+ Brachial   86           triphasic                                          +-----------+--------+-----+---------+-----------------------------------------+ Radial                  triphasic                                          +-----------+--------+-----+---------+-----------------------------------------+ Ulnar                   triphasic                                          +-----------+--------+-----+---------+-----------------------------------------+ Palmar Arch                      Signal is unaffected with radial                                            compresion, obliterates with ulnar  compression.                              +-----------+--------+-----+---------+-----------------------------------------+  Summary: Right Carotid: Velocities in the right ICA are consistent with a 1-39% stenosis. Left Carotid: Velocities in the left ICA are consistent with a 1-39% stenosis. Vertebrals:  Bilateral vertebral arteries demonstrate antegrade flow. Subclavians: Normal flow hemodynamics were seen in bilateral subclavian              arteries. Right ABI: Resting right ankle-brachial index is within normal range. No evidence of significant right lower extremity arterial disease. Left ABI: Resting left ankle-brachial index is within normal range. No evidence of significant left lower extremity arterial disease. Right Upper Extremity: No significant arterial obstruction detected in the right upper extremity. Left Upper Extremity: No significant arterial obstruction detected in the left upper extremity.     Preliminary      I have independently reviewed the above radiologic studies and discussed with the patient   Recent Lab Findings: Lab Results  Component Value Date   WBC 14.3 (H) 03/23/2019   HGB 14.5 03/23/2019   HCT 41.8 03/23/2019   PLT 344 03/23/2019   GLUCOSE 266 (H) 03/23/2019   CHOL 276 (H) 03/23/2019   TRIG 200 (H) 03/23/2019   HDL 35 (L) 03/23/2019   LDLCALC 201 (H) 03/23/2019   ALT 18 03/23/2019   AST 33 03/23/2019   NA 135 03/23/2019   K 4.5 03/23/2019   CL 103 03/23/2019   CREATININE 0.64 03/23/2019   BUN 15 03/23/2019   CO2 19 (L) 03/23/2019   TSH 1.094 03/23/2019   INR 1.0 03/23/2019   HGBA1C 8.3 (H) 03/23/2019      Assessment / Plan:   Non-ST elevation MI with severe three-vessel CAD Diabetes mellitus Active smoking Dyslipidemia  Plan multivessel bypass grafting first available OR opening which is a.m. 11-6.  Patient now understands  the situation and agrees to proceed with CABG.  Stop Cangrelor 8 hours before surgery.  Continue heparin until on-call to the OR.     @ 03/23/2019 5:40 PM

## 2019-03-23 NOTE — Progress Notes (Signed)
Day of Surgery Procedure(s) (LRB): LEFT HEART CATH AND CORONARY ANGIOGRAPHY (N/A) Subjective: I reviewed the coronary arteriograms with Ms. Sundt with Dr. Angelena Form in the Cath Lab.  We discussed therapeutic options for this patient involving coordination of care.  Patient's cardiologist has recommended coronary bypass grafting for for severe three-vessel CAD with recent inferior wall MI.  I discussed the recommendation for CABG with the patient in the Cath Lab and at that time she has refused surgery.  The patient will be transferred to the ICU, treated medically for her acute MI and more discussion and information regarding her decision about surgery will be pursued.  Past medical history Diabetes Active smoking No previous surgery or general anesthesia Objective: Vital signs in last 24 hours: Pulse Rate:  [0-98] 0 (11/04 0802) Resp:  [0-88] 0 (11/04 0802) BP: (95-140)/(58-77) 133/73 (11/04 0802) SpO2:  [0 %-100 %] 0 % (11/04 0802)  Hemodynamic parameters for last 24 hours:  Stable blood pressure  Intake/Output from previous day: No intake/output data recorded. Intake/Output this shift: No intake/output data recorded.  Patient is covered with drapes in the Cath Lab. She is alert mentally intact and responsive with minimal chest pain. Normal sinus rhythm, stable vital signs Lab Results: Recent Labs    03/23/19 0753  WBC 14.3*  HGB 14.5  HCT 41.8  PLT 344   BMET: No results for input(s): NA, K, CL, CO2, GLUCOSE, BUN, CREATININE, CALCIUM in the last 72 hours.  PT/INR:  Recent Labs    03/23/19 0753  LABPROT 13.1  INR 1.0   ABG No results found for: PHART, HCO3, TCO2, ACIDBASEDEF, O2SAT CBG (last 3)  No results for input(s): GLUCAP in the last 72 hours.  Assessment/Plan: S/P Procedure(s) (LRB): LEFT HEART CATH AND CORONARY ANGIOGRAPHY (N/A) Recommend medical treatment of her acute MI. Maintain n.p.o. We will review situation and provide patient with more  information regarding CABG so she can make an informed decision about urgent CABG.   LOS: 0 days    Tharon Aquas Trigt III 03/23/2019

## 2019-03-24 ENCOUNTER — Other Ambulatory Visit: Payer: Self-pay

## 2019-03-24 ENCOUNTER — Inpatient Hospital Stay (HOSPITAL_COMMUNITY): Payer: Medicaid Other

## 2019-03-24 ENCOUNTER — Encounter (HOSPITAL_COMMUNITY): Payer: Self-pay | Admitting: *Deleted

## 2019-03-24 DIAGNOSIS — E118 Type 2 diabetes mellitus with unspecified complications: Secondary | ICD-10-CM

## 2019-03-24 DIAGNOSIS — R0683 Snoring: Secondary | ICD-10-CM

## 2019-03-24 DIAGNOSIS — Z794 Long term (current) use of insulin: Secondary | ICD-10-CM

## 2019-03-24 DIAGNOSIS — Z720 Tobacco use: Secondary | ICD-10-CM

## 2019-03-24 LAB — CBC
HCT: 38 % (ref 36.0–46.0)
Hemoglobin: 12.9 g/dL (ref 12.0–15.0)
MCH: 28.8 pg (ref 26.0–34.0)
MCHC: 33.9 g/dL (ref 30.0–36.0)
MCV: 84.8 fL (ref 80.0–100.0)
Platelets: 285 10*3/uL (ref 150–400)
RBC: 4.48 MIL/uL (ref 3.87–5.11)
RDW: 12.7 % (ref 11.5–15.5)
WBC: 9 10*3/uL (ref 4.0–10.5)
nRBC: 0 % (ref 0.0–0.2)

## 2019-03-24 LAB — GLUCOSE, CAPILLARY
Glucose-Capillary: 237 mg/dL — ABNORMAL HIGH (ref 70–99)
Glucose-Capillary: 244 mg/dL — ABNORMAL HIGH (ref 70–99)
Glucose-Capillary: 265 mg/dL — ABNORMAL HIGH (ref 70–99)
Glucose-Capillary: 250 mg/dL — ABNORMAL HIGH (ref 70–99)

## 2019-03-24 LAB — PULMONARY FUNCTION TEST
FEF 25-75 Pre: 1.61 L/sec
FEF2575-%Pred-Pre: 59 %
FEV1-%Pred-Pre: 74 %
FEV1-Pre: 1.96 L
FEV1FVC-%Pred-Pre: 93 %
FEV6-%Pred-Pre: 79 %
FEV6-Pre: 2.58 L
FEV6FVC-%Pred-Pre: 102 %
FVC-%Pred-Pre: 78 %
FVC-Pre: 2.61 L
Pre FEV1/FVC ratio: 75 %
Pre FEV6/FVC Ratio: 100 %

## 2019-03-24 LAB — BASIC METABOLIC PANEL
Anion gap: 9 (ref 5–15)
BUN: 12 mg/dL (ref 6–20)
CO2: 22 mmol/L (ref 22–32)
Calcium: 8.6 mg/dL — ABNORMAL LOW (ref 8.9–10.3)
Chloride: 103 mmol/L (ref 98–111)
Creatinine, Ser: 0.64 mg/dL (ref 0.44–1.00)
GFR calc Af Amer: >60 mL/min (ref >60)
GFR calc non Af Amer: >60 mL/min (ref >60)
Glucose, Bld: 270 mg/dL — ABNORMAL HIGH (ref 70–99)
Potassium: 3.9 mmol/L (ref 3.5–5.1)
Sodium: 134 mmol/L — ABNORMAL LOW (ref 135–145)

## 2019-03-24 LAB — TROPONIN I (HIGH SENSITIVITY): Troponin I (High Sensitivity): 25072 ng/L (ref <18)

## 2019-03-24 LAB — PROTIME-INR
INR: 1 (ref 0.8–1.2)
Prothrombin Time: 12.8 seconds (ref 11.4–15.2)

## 2019-03-24 LAB — HEPARIN LEVEL (UNFRACTIONATED)
Heparin Unfractionated: 0.24 IU/mL — ABNORMAL LOW (ref 0.30–0.70)
Heparin Unfractionated: 0.45 IU/mL (ref 0.30–0.70)

## 2019-03-24 LAB — PREPARE RBC (CROSSMATCH)

## 2019-03-24 MED ORDER — TRANEXAMIC ACID (OHS) BOLUS VIA INFUSION
15.0000 mg/kg | INTRAVENOUS | Status: AC
  Administered 2019-03-25: 1179 mg via INTRAVENOUS
  Filled 2019-03-24: qty 1179

## 2019-03-24 MED ORDER — DEXMEDETOMIDINE HCL IN NACL 400 MCG/100ML IV SOLN
0.1000 ug/kg/h | INTRAVENOUS | Status: DC
  Filled 2019-03-24 (×2): qty 100

## 2019-03-24 MED ORDER — DOPAMINE-DEXTROSE 3.2-5 MG/ML-% IV SOLN
0.0000 ug/kg/min | INTRAVENOUS | Status: AC
  Administered 2019-03-25: 3 ug/kg/min via INTRAVENOUS
  Filled 2019-03-24: qty 250

## 2019-03-24 MED ORDER — NITROGLYCERIN IN D5W 200-5 MCG/ML-% IV SOLN
2.0000 ug/min | INTRAVENOUS | Status: DC
  Filled 2019-03-24: qty 250

## 2019-03-24 MED ORDER — MILRINONE LACTATE IN DEXTROSE 20-5 MG/100ML-% IV SOLN
0.3000 ug/kg/min | INTRAVENOUS | Status: AC
  Administered 2019-03-25: .125 ug/kg/min via INTRAVENOUS
  Filled 2019-03-24: qty 100

## 2019-03-24 MED ORDER — CHLORHEXIDINE GLUCONATE 4 % EX LIQD
60.0000 mL | Freq: Once | CUTANEOUS | Status: AC
  Administered 2019-03-25: 4 via TOPICAL
  Filled 2019-03-24: qty 60

## 2019-03-24 MED ORDER — CHLORHEXIDINE GLUCONATE 0.12 % MT SOLN
15.0000 mL | Freq: Once | OROMUCOSAL | Status: AC
  Administered 2019-03-25: 08:00:00 15 mL via OROMUCOSAL
  Filled 2019-03-24: qty 15

## 2019-03-24 MED ORDER — MAGNESIUM SULFATE 50 % IJ SOLN
40.0000 meq | INTRAMUSCULAR | Status: DC
  Filled 2019-03-24: qty 9.85

## 2019-03-24 MED ORDER — SODIUM CHLORIDE 0.9 % IV SOLN
0.7500 ug/kg/min | INTRAVENOUS | Status: DC
  Administered 2019-03-24: 20:00:00 0.75 ug/kg/min via INTRAVENOUS
  Filled 2019-03-24 (×2): qty 50

## 2019-03-24 MED ORDER — INSULIN REGULAR(HUMAN) IN NACL 100-0.9 UT/100ML-% IV SOLN
INTRAVENOUS | Status: AC
  Administered 2019-03-25: 1 [IU]/h via INTRAVENOUS
  Filled 2019-03-24: qty 100

## 2019-03-24 MED ORDER — TRANEXAMIC ACID 1000 MG/10ML IV SOLN
1.5000 mg/kg/h | INTRAVENOUS | Status: DC
  Filled 2019-03-24: qty 25

## 2019-03-24 MED ORDER — CHLORHEXIDINE GLUCONATE CLOTH 2 % EX PADS
6.0000 | MEDICATED_PAD | Freq: Every day | CUTANEOUS | Status: DC
  Administered 2019-03-24: 17:00:00 6 via TOPICAL

## 2019-03-24 MED ORDER — PNEUMOCOCCAL VAC POLYVALENT 25 MCG/0.5ML IJ INJ: 0.5000 mL | INJECTION | INTRAMUSCULAR | Status: DC | PRN

## 2019-03-24 MED ORDER — POTASSIUM CHLORIDE 2 MEQ/ML IV SOLN
80.0000 meq | INTRAVENOUS | Status: DC
  Filled 2019-03-24: qty 40

## 2019-03-24 MED ORDER — TEMAZEPAM 15 MG PO CAPS
15.0000 mg | ORAL_CAPSULE | Freq: Once | ORAL | Status: AC | PRN
  Administered 2019-03-24: 20:00:00 15 mg via ORAL
  Filled 2019-03-24: qty 1

## 2019-03-24 MED ORDER — INFLUENZA VAC SPLIT QUAD 0.5 ML IM SUSY: 0.5000 mL | PREFILLED_SYRINGE | INTRAMUSCULAR | Status: DC | PRN

## 2019-03-24 MED ORDER — EPINEPHRINE HCL 5 MG/250ML IV SOLN IN NS
0.0000 ug/min | INTRAVENOUS | Status: DC
  Filled 2019-03-24: qty 250

## 2019-03-24 MED ORDER — DIAZEPAM 5 MG PO TABS
5.0000 mg | ORAL_TABLET | Freq: Once | ORAL | Status: AC
  Administered 2019-03-25: 5 mg via ORAL
  Filled 2019-03-24: qty 1

## 2019-03-24 MED ORDER — PHENYLEPHRINE HCL-NACL 20-0.9 MG/250ML-% IV SOLN
30.0000 ug/min | INTRAVENOUS | Status: DC
  Filled 2019-03-24: qty 250

## 2019-03-24 MED ORDER — VANCOMYCIN HCL 10 G IV SOLR
1250.0000 mg | INTRAVENOUS | Status: DC
  Filled 2019-03-24: qty 1250

## 2019-03-24 MED ORDER — SODIUM CHLORIDE 0.9 % IV SOLN
750.0000 mg | INTRAVENOUS | Status: AC
  Administered 2019-03-25: 750 mg via INTRAVENOUS
  Filled 2019-03-24: qty 750

## 2019-03-24 MED ORDER — CHLORHEXIDINE GLUCONATE 4 % EX LIQD
60.0000 mL | Freq: Once | CUTANEOUS | Status: AC
  Administered 2019-03-24: 4 via TOPICAL
  Filled 2019-03-24: qty 15

## 2019-03-24 MED ORDER — CARVEDILOL 3.125 MG PO TABS
3.1250 mg | ORAL_TABLET | Freq: Two times a day (BID) | ORAL | Status: DC
  Administered 2019-03-24 (×2): 3.125 mg via ORAL
  Filled 2019-03-24 (×2): qty 1

## 2019-03-24 MED ORDER — PLASMA-LYTE 148 IV SOLN
INTRAVENOUS | Status: DC
  Filled 2019-03-24: qty 2.5

## 2019-03-24 MED ORDER — METOPROLOL TARTRATE 12.5 MG HALF TABLET
12.5000 mg | ORAL_TABLET | Freq: Once | ORAL | Status: AC
  Administered 2019-03-25: 12.5 mg via ORAL
  Filled 2019-03-24: qty 1

## 2019-03-24 MED ORDER — BISACODYL 5 MG PO TBEC
5.0000 mg | DELAYED_RELEASE_TABLET | Freq: Once | ORAL | Status: AC
  Administered 2019-03-24: 5 mg via ORAL
  Filled 2019-03-24: qty 1

## 2019-03-24 MED ORDER — SODIUM CHLORIDE 0.9 % IV SOLN
INTRAVENOUS | Status: DC
  Filled 2019-03-24: qty 30

## 2019-03-24 MED ORDER — SODIUM CHLORIDE 0.9 % IV SOLN
1.5000 g | INTRAVENOUS | Status: AC
  Administered 2019-03-25: 1.5 g via INTRAVENOUS
  Filled 2019-03-24: qty 1.5

## 2019-03-24 MED ORDER — TRANEXAMIC ACID (OHS) PUMP PRIME SOLUTION
2.0000 mg/kg | INTRAVENOUS | Status: DC
  Filled 2019-03-24: qty 1.57

## 2019-03-24 MED FILL — Nitroglycerin IV Soln 100 MCG/ML in D5W: INTRA_ARTERIAL | Qty: 20 | Status: AC

## 2019-03-24 NOTE — Plan of Care (Signed)

## 2019-03-24 NOTE — Progress Notes (Signed)
1 Day Post-Op Procedure(s) (LRB): LEFT HEART CATH AND CORONARY ANGIOGRAPHY (N/A) Subjective: Patient without problems overnight.  A.m. EKG with nonspecific changes Cardiac enzymes remain minimal Plan multivessel CABG tomorrow We will DC Cangrelor 6 hours preop  Objective: Vital signs in last 24 hours: Temp:  [98.1 F (36.7 C)-98.9 F (37.2 C)] 98.8 F (37.1 C) (11/05 0841) Pulse Rate:  [89] 89 (11/04 1115) Cardiac Rhythm: Normal sinus rhythm (11/05 0400) Resp:  [16-27] 19 (11/05 0800) BP: (44-142)/(16-101) 110/85 (11/05 0800) SpO2:  [90 %-100 %] 95 % (11/05 0800) Weight:  [78.6 kg] 78.6 kg (11/04 1130)  Hemodynamic parameters for last 24 hours:  Sinus rhythm afebrile  Intake/Output from previous day: 11/04 0701 - 11/05 0700 In: 1210.3 [P.O.:800; I.V.:410.3] Out: 275 [Urine:275] Intake/Output this shift: Total I/O In: 17.7 [I.V.:17.7] Out: -        Exam    General- alert and comfortable    Neck- no JVD, no cervical adenopathy palpable, no carotid bruit   Lungs- clear without rales, wheezes   Cor- regular rate and rhythm, no murmur , gallop   Abdomen- soft, non-tender   Extremities - warm, non-tender, minimal edema   Neuro- oriented, appropriate, no focal weakness   Lab Results: Recent Labs    03/23/19 0753 03/24/19 0502  WBC 14.3* 9.0  HGB 14.5 12.9  HCT 41.8 38.0  PLT 344 285   BMET:  Recent Labs    03/23/19 1136 03/24/19 0502  NA 135 134*  K 4.5 3.9  CL 103 103  CO2 19* 22  GLUCOSE 266* 270*  BUN 15 12  CREATININE 0.64 0.64  CALCIUM 8.8* 8.6*    PT/INR:  Recent Labs    03/24/19 0502  LABPROT 12.8  INR 1.0   ABG    Component Value Date/Time   TCO2 22 03/23/2019 0746   CBG (last 3)  Recent Labs    03/23/19 1652 03/23/19 2127 03/24/19 0608  GLUCAP 279* 206* 237*    Assessment/Plan: S/P Procedure(s) (LRB): LEFT HEART CATH AND CORONARY ANGIOGRAPHY (N/A) Pre-CABG Dopplers are satisfactory .  Patient for multivessel CABG in  a.m.--benefits and risks of surgery have been reviewed with the patient.    DC Cangrelor at midnight   LOS: 1 day    Tharon Aquas Trigt III 03/24/2019

## 2019-03-24 NOTE — Progress Notes (Signed)
Pre procedure education initiated. Consents signed. Surgical pcr completed on admit. Patient educated on what happens night before surgery and morning of surgery. IS handout given. IS practiced x10, able to reach 1000-1250 consistently. Cardiac surgery booklet given. Pre cardiac surgery video watched. Answered patient's and patient's son's questions.   Joellen Jersey, RN

## 2019-03-24 NOTE — Progress Notes (Signed)
EVENING ROUNDS NOTE :     [address]       [place] [address]             [phone]                 1 Day Post-Op Procedure(s) (LRB): LEFT HEART CATH AND CORONARY ANGIOGRAPHY (N/A)   Total Length of Stay:  LOS: 1 day  Events:  No events OR tomorrow    BP (!) 107/57   Pulse 89   Temp 98.5 F (36.9 C) (Oral)   Resp (!) 23   Ht 5\' 2"  (1.575 m)   Wt 78.6 kg   SpO2 99%   BMI 31.69 kg/m         . sodium chloride    . cangrelor 50 mg in NS 250 mL 0.75 mcg/kg/min (03/24/19 1100)  . [START ON 03/25/2019] cefUROXime (ZINACEF)  IV    . [START ON 03/25/2019] cefUROXime (ZINACEF)  IV    . [START ON 03/25/2019] dexmedetomidine    . [START ON 03/25/2019] DOPamine    . [START ON 03/25/2019] heparin 30,000 units/NS 1000 mL solution for CELLSAVER    . heparin 1,400 Units/hr (03/24/19 1303)  . [START ON 03/25/2019] milrinone    . [START ON 03/25/2019] nitroGLYCERIN    . [START ON 03/25/2019] tranexamic acid (CYKLOKAPRON) infusion ([facility])    . [START ON 03/25/2019] vancomycin      I/O last 3 completed shifts: In: 1210.3 [P.O.:800; I.V.:410.3] Out: 275 [Urine:275]   CBC Latest Ref Rng & Units 03/24/2019 03/23/2019 03/23/2019  WBC 4.0 - 10.5 K/uL 9.0 14.3(H) -  Hemoglobin 12.0 - 15.0 g/dL 12.9 14.5 15.0  Hematocrit 36.0 - 46.0 % 38.0 41.8 44.0  Platelets 150 - 400 K/uL 285 344 -    BMP Latest Ref Rng & Units 03/24/2019 03/23/2019 03/23/2019  Glucose 70 - 99 mg/dL 270(H) 266(H) 338(H)  BUN 6 - 20 mg/dL 12 15 16   Creatinine 0.44 - 1.00 mg/dL 0.64 0.64 0.73  Sodium 135 - 145 mmol/L 134(L) 135 131(L)  Potassium 3.5 - 5.1 mmol/L 3.9 4.5 4.3  Chloride 98 - 111 mmol/L 103 103 100  CO2 22 - 32 mmol/L 22 19(L) 19(L)  Calcium 8.9 - 10.3 mg/dL 8.6(L) 8.8(L) 8.9    ABG    Component Value Date/Time   TCO2 22 03/23/2019 0746       Melodie Bouillon, MD 03/24/2019 4:48 PM

## 2019-03-24 NOTE — Progress Notes (Signed)
ANTICOAGULATION CONSULT NOTE - Follow Up Consult  Pharmacy Consult for Heparin Indication: chest pain/ACS  No Known Allergies  Patient Measurements: Height: 5\' 2"  (157.5 cm) Weight: 173 lb 4.5 oz (78.6 kg) IBW/kg (Calculated) : 50.1 Heparin Dosing Weight: 67 kg  Vital Signs: Temp: 98.5 F (36.9 C) (11/05 1558) Temp Source: Oral (11/05 1558) BP: 107/57 (11/05 1600)  Labs: Recent Labs    03/23/19 0746 03/23/19 0753 03/23/19 1136 03/23/19 2213 03/24/19 0502 03/24/19 1536  HGB 15.0 14.5  --   --  12.9  --   HCT 44.0 41.8  --   --  38.0  --   PLT  --  344  --   --  285  --   APTT  --  27  --   --   --   --   LABPROT  --  13.1  --   --  12.8  --   INR  --  1.0  --   --  1.0  --   HEPARINUNFRC  --   --   --  <0.10* 0.24* 0.45  CREATININE 0.50 0.73 0.64  --  0.64  --   TROPONINIHS  --  24*  --   --  25,072*  --     Estimated Creatinine Clearance: 82.6 mL/min (by C-G formula based on SCr of 0.64 mg/dL).  Assessment: 48 year old female presented to ED with chest pain, EKG revealed ST elevation cath revealed multivessel CAD. Patient started on cangrelor in cath lab due to poor distal flow and IV heparin afterwards.  Heparin level within goal  Goal of Therapy:  Heparin level 0.3-0.5 units/ml  Monitor platelets by anticoagulation protocol: Yes   Plan:  Continue heparin to 1400 units/hour Daily hl cbc  Barth Kirks, PharmD, BCPS, BCCCP Clinical Pharmacist [phone]  Please check AMION for all [facility] numbers  03/24/2019 4:48 PM

## 2019-03-24 NOTE — Progress Notes (Signed)
CARDIAC REHAB PHASE I   Preop education completed with pt and son. Pt with IS and Cardiac Surgery booklet at bedside. Pt given in-the-tube sheet along with [facility] care guide. Pts son states she is going to have a hard time with the restrictions. Reviewed importance of walks, IS use and sternal precautions post-op. Stressed importance of staying in the hospital and receiving surgery as seeing her grand kids grow is a priority. Pt states that her mother is coming to town from [place] to help once discharged.  [phone] Rufina Falco, RN BSN 03/24/2019 1:58 PM

## 2019-03-24 NOTE — Progress Notes (Addendum)
Progress Note  Patient Name: Joan Webb Date of Encounter: 03/24/2019  Primary Cardiologist: No primary care provider on file.   Subjective   No CP or SOB this morning. Discussed initial symptoms. She was woken suddenly out of her sleep with chest pressure and heaviness and tingling in her arms yesterday evening. She states she also snores and often has difficulty sleeping at night prior to this. She has smoked since she was 48 years old. Her father has history of cardiovascular disease with multiple cardiac events over the years.   Inpatient Medications    Scheduled Meds: . aspirin EC  81 mg Oral Daily  . atorvastatin  80 mg Oral q1800  . gabapentin  300 mg Oral BID  . insulin aspart  0-15 Units Subcutaneous TID WC  . insulin aspart  0-5 Units Subcutaneous QHS  . insulin glargine  18 Units Subcutaneous BID  . mometasone-formoterol  2 puff Inhalation BID  . nicotine  14 mg Transdermal Daily  . sodium chloride flush  3 mL Intravenous Q12H   Continuous Infusions: . sodium chloride    . cangrelor 50 mg in NS 250 mL 0.75 mcg/kg/min (03/24/19 0600)  . heparin 1,250 Units/hr (03/24/19 0600)   PRN Meds: sodium chloride, acetaminophen, ALPRAZolam, morphine injection, ondansetron (ZOFRAN) IV, sodium chloride flush   Vital Signs    Vitals:   03/24/19 0348 03/24/19 0400 03/24/19 0500 03/24/19 0600  BP:  115/63 (!) 93/54 106/63  Pulse:      Resp:  20 19 20   Temp: 98.9 F (37.2 C)     TempSrc: Oral     SpO2:  95% 94% 92%  Weight:      Height:        Intake/Output Summary (Last 24 hours) at 03/24/2019 0652 Last data filed at 03/24/2019 0500 Gross per 24 hour  Intake 1210.25 ml  Output 275 ml  Net 935.25 ml   Filed Weights   03/23/19 1130  Weight: 78.6 kg    Telemetry    NSR - Personally Reviewed  ECG    ECG 11/5: Q-wave aVF and III, otherwise NSR, resolution of ST changes.    ECG 11/4: inferior ST elevation in leads II, III, aVF. ST depression aVL and lead I -  Personally Reviewed  Physical Exam   GEN: NAD, sitting up in bed Neck: No JVD, [place]/AT Cardiac: RRR, no murmurs, rubs, or gallops.  Respiratory: Clear to auscultation bilaterally. GI: Soft, nontender, non-distended  MS: No LE edema; No deformity. Neuro:  Nonfocal, a&ox3 Psych: Normal affect, pleasant   Labs    Chemistry Recent Labs  Lab 03/23/19 0753 03/23/19 1136 03/24/19 0502  NA 131* 135 134*  K 4.3 4.5 3.9  CL 100 103 103  CO2 19* 19* 22  GLUCOSE 338* 266* 270*  BUN 16 15 12   CREATININE 0.73 0.64 0.64  CALCIUM 8.9 8.8* 8.6*  PROT 6.5 6.2*  --   ALBUMIN 3.7 3.5  --   AST 16 33  --   ALT 17 18  --   ALKPHOS 83 80  --   BILITOT 0.3 0.4  --   GFRNONAA >60 >60 >60  GFRAA >60 >60 >60  ANIONGAP 12 13 9      Hematology Recent Labs  Lab 03/23/19 0746 03/23/19 0753 03/24/19 0502  WBC  --  14.3* 9.0  RBC  --  5.04 4.48  HGB 15.0 14.5 12.9  HCT 44.0 41.8 38.0  MCV  --  82.9 84.8  MCH  --  28.8 28.8  MCHC  --  34.7 33.9  RDW  --  12.7 12.7  PLT  --  344 285    Cardiac EnzymesNo results for input(s): TROPONINI in the last 168 hours. No results for input(s): TROPIPOC in the last 168 hours.   BNPNo results for input(s): BNP, PROBNP in the last 168 hours.   DDimer No results for input(s): DDIMER in the last 168 hours.   Radiology    Dg Chest Port 1 View  Result Date: 03/23/2019 CLINICAL DATA:  Acute myocardial infarction.  Chest pain. EXAM: PORTABLE CHEST 1 VIEW COMPARISON:  None. FINDINGS: The heart size and mediastinal contours are within normal limits. Both lungs are clear. The visualized skeletal structures are unremarkable. IMPRESSION: Normal exam. Electronically Signed   By: Lorriane Shire M.D.   On: 03/23/2019 11:53   Vas US Doppler Pre Cabg  Result Date: 03/23/2019 PREOPERATIVE VASCULAR EVALUATION  Indications:      Pre-surgical evaluation. Risk Factors:     Hyperlipidemia, Diabetes. Comparison Study: No prior study Performing Technologist: Maudry Mayhew MHA, RVT, RDCS, RDMS  Examination Guidelines: A complete evaluation includes B-mode imaging, spectral Doppler, color Doppler, and power Doppler as needed of all accessible portions of each vessel. Bilateral testing is considered an integral part of a complete examination. Limited examinations for reoccurring indications may be performed as noted.  Right Carotid Findings: +----------+--------+--------+--------+----------------------+--------+           PSV cm/sEDV cm/sStenosisDescribe              Comments +----------+--------+--------+--------+----------------------+--------+ CCA Prox  85      19                                             +----------+--------+--------+--------+----------------------+--------+ CCA Distal56      12              homogeneous and smooth         +----------+--------+--------+--------+----------------------+--------+ ICA Prox  90      23                                             +----------+--------+--------+--------+----------------------+--------+ ICA Distal69      25                                             +----------+--------+--------+--------+----------------------+--------+ ECA       120     14              smooth and homogeneous         +----------+--------+--------+--------+----------------------+--------+ Portions of this table do not appear on this page. +----------+--------+-------+----------------+------------+           PSV cm/sEDV cmsDescribe        Arm Pressure +----------+--------+-------+----------------+------------+ [id number]            Multiphasic, WNL             +----------+--------+-------+----------------+------------+ +---------+--------+--+--------+-+---------+ VertebralPSV cm/s32EDV cm/s5Antegrade +---------+--------+--+--------+-+---------+ Left Carotid Findings: +----------+--------+--------+--------+-----------------------+--------+           PSV cm/sEDV cm/sStenosisDescribe                Comments +----------+--------+--------+--------+-----------------------+--------+  CCA Prox  100     20                                              +----------+--------+--------+--------+-----------------------+--------+ CCA Distal87      20              smooth and homogeneous          +----------+--------+--------+--------+-----------------------+--------+ ICA Prox  73      21                                              +----------+--------+--------+--------+-----------------------+--------+ ICA Distal97      32                                              +----------+--------+--------+--------+-----------------------+--------+ ECA       144     15              smooth and heterogenous         +----------+--------+--------+--------+-----------------------+--------+ +----------+--------+--------+----------------+------------+ SubclavianPSV cm/sEDV cm/sDescribe        Arm Pressure +----------+--------+--------+----------------+------------+           106             Multiphasic, WNL86           +----------+--------+--------+----------------+------------+ +---------+--------+--+--------+--+---------+ VertebralPSV cm/s45EDV cm/s11Antegrade +---------+--------+--+--------+--+---------+  ABI Findings: +--------+------------------+-----+---------+----------------------------------+ Right   Rt Pressure (mmHg)IndexWaveform Comment                            +--------+------------------+-----+---------+----------------------------------+ Brachial                       triphasicUnable to obtain pressure due to                                           TR band                            +--------+------------------+-----+---------+----------------------------------+ PTA     84                0.98 triphasic                                   +--------+------------------+-----+---------+----------------------------------+ DP      92                 1.07 triphasic                                   +--------+------------------+-----+---------+----------------------------------+ +--------+------------------+-----+---------+-------+ Left    Lt Pressure (mmHg)IndexWaveform Comment +--------+------------------+-----+---------+-------+ Brachial86                     triphasic        +--------+------------------+-----+---------+-------+ PTA     104  1.21 triphasic        +--------+------------------+-----+---------+-------+ DP      97                1.13 triphasic        +--------+------------------+-----+---------+-------+ +-------+---------------+----------------+ ABI/TBIToday's ABI/TBIPrevious ABI/TBI +-------+---------------+----------------+ Right  1.07                            +-------+---------------+----------------+ Left   1.21                            +-------+---------------+----------------+  Right Doppler Findings: +-----------+--------+-----+---------+----------------------------------------+ Site       PressureIndexDoppler  Comments                                 +-----------+--------+-----+---------+----------------------------------------+ Brachial                triphasicUnable to obtain pressure due to TR band +-----------+--------+-----+---------+----------------------------------------+ Radial                  triphasic                                         +-----------+--------+-----+---------+----------------------------------------+ Ulnar                   triphasic                                         +-----------+--------+-----+---------+----------------------------------------+ Palmar Arch                      Unable to evaluate due to TR band        +-----------+--------+-----+---------+----------------------------------------+  Left Doppler Findings: +-----------+--------+-----+---------+-----------------------------------------+ Site        PressureIndexDoppler  Comments                                  +-----------+--------+-----+---------+-----------------------------------------+ Brachial   86           triphasic                                          +-----------+--------+-----+---------+-----------------------------------------+ Radial                  triphasic                                          +-----------+--------+-----+---------+-----------------------------------------+ Ulnar                   triphasic                                          +-----------+--------+-----+---------+-----------------------------------------+ Palmar Arch                      Signal is unaffected with radial  compresion, obliterates with ulnar                                         compression.                              +-----------+--------+-----+---------+-----------------------------------------+  Summary: Right Carotid: Velocities in the right ICA are consistent with a 1-39% stenosis. Left Carotid: Velocities in the left ICA are consistent with a 1-39% stenosis. Vertebrals:  Bilateral vertebral arteries demonstrate antegrade flow. Subclavians: Normal flow hemodynamics were seen in bilateral subclavian              arteries. Right ABI: Resting right ankle-brachial index is within normal range. No evidence of significant right lower extremity arterial disease. Left ABI: Resting left ankle-brachial index is within normal range. No evidence of significant left lower extremity arterial disease. Right Upper Extremity: No significant arterial obstruction detected in the right upper extremity. Left Upper Extremity: No significant arterial obstruction detected in the left upper extremity.  Electronically signed by Gretta Beganodd Early MD on 03/23/2019 at 6:27:07 PM.    Final     Cardiac Studies   ECHO 11/5 IMPRESSIONS   1. Left ventricular ejection fraction, by visual  estimation, is 60 to 65%. The left ventricle has normal function. There is no left ventricular hypertrophy.  2. Left ventricular diastolic parameters are indeterminate.  3. Global right ventricle has normal systolic function.The right ventricular size is normal. No increase in right ventricular wall thickness.  4. Left atrial size was normal.  5. Right atrial size was normal.  6. The mitral valve is normal in structure. No evidence of mitral valve regurgitation. No evidence of mitral stenosis.  7. The tricuspid valve is normal in structure. Tricuspid valve regurgitation is not demonstrated.  8. The aortic valve is tricuspid. Aortic valve regurgitation is not visualized. Mild aortic valve sclerosis without stenosis.  9. The pulmonic valve was normal in structure. Pulmonic valve regurgitation is not visualized. 10. TR signal is inadequate for assessing pulmonary artery systolic pressure. 11. The inferior vena cava is normal in size with <50% respiratory variability, suggesting right atrial pressure of 8 mmHg.   Cath 11/5   Prox RCA lesion is 99% stenosed.  Mid RCA lesion is 95% stenosed.  Dist RCA lesion is 60% stenosed.  1st RPL lesion is 100% stenosed.  1st Mrg lesion is 99% stenosed.  Mid Cx lesion is 70% stenosed.  Mid LAD lesion is 90% stenosed.  1st Diag lesion is 90% stenosed.  There is mild left ventricular systolic dysfunction.  LV end diastolic pressure is normal.  There is no mitral valve regurgitation.  The left ventricular ejection fraction is 35-45% by visual estimate.   1. Acute inferior STEMI secondary to severe disease in the mid and distal RCA and acute occlusion of a small posterolateral artery.  2. Severe stenosis mid LAD 3. Severe stenosis moderate caliber diagonal branch 4. Severe stenosis first obtuse marginal branch 5. Severe stenosis mid Circumflex 6. Mild to moderate LV systolic dysfunction with inferior wall motion abnormality.    Recommendations: Will continue ASA 81 mg daily. Will start Cangrelor in the cath lab while awaiting surgery given the poor flow in the distal RCA branches. Will start IV heparin 4 hours post sheath pull. Start high intensity statin. Echo today. CT surgery consult for CABG which  I think is the best revascularization strategy given the severe, multi-vessel CAD in a diabetic. Residual ST elevation in the inferior leads that is felt to be due to the occluded small posterolateral artery. There is good flow in the remainder of the RCA and PDA. Dr. Donata Clay has seen her here in the cath lab.   Patient Profile     48 y.o. female TIID, tobacco use 30 pack years presenting with inferior STEMI and ST depression lateral leads.   Assessment & Plan    1. STEMI 2/2 Multivessel disease Underwent cath yesterday which showed multivessel disease with lesions along the mid and distal LAD, diagonal branch, marginal, posterolateral artery and mid circumflax in addition to mild LV systolic dysfunction and inferior wall motion abnormality. ECHO overall within normal limits. She was started on asa and cangrelor post cath and will have CABG early tomorrow morning.   - cont. Cangrelor, asa today - cont. Heparin  - start coreg 3.125 mg bid - long discussion held concerning the importance of smoking cessation.  For questions or updates, please contact [facility] Please consult [email or web address] for contact info under Cardiology/STEMI.      Signed, Versie Starks, DO  03/24/2019, 6:52 AM     Patient seen and examined. Agree with assessment and plan.  Coronary angiography images personally reviewed.  Culprit lesion is RCA with initial inferior ST elevation and lateral T wave inversion.  There is significant concomitant CAD and diffusely small vessels contributed by her longstanding diabetes mellitus.  Patient was awakened from sleep at 5 am with severe chest pain leading to her presentation yesterday morning.  She has a  longstanding history of snoring, poor sleep, frequent awakenings.  High likelihood for sleep apnea induced nocturnal hypoxemia in the setting of probable REM sleep in the early morning contributing to her acute coronary syndrome.  She will ultimately need a sleep study done as an outpatient which we can schedule following her hospitalization.  No recurrent anginal symptoms.  Continue cangrelor today with plans to discontinue at least 6 hours prior to planned CABG revascularization tomorrow.  We will add low-dose carvedilol 3.125 mg twice daily light of her significant CAD for initial low dose post MI beta-blockade.  I discussed the importance of smoking cessation.   Lennette Bihari, MD, [facility] 03/24/2019 11:34 AM

## 2019-03-24 NOTE — Progress Notes (Signed)
Chaplain provided follow-up visit to Ms. Ciaramitaro and gave her Advance Directive paperwork.  Ms. Khurana seems much less anxious today from last night.  Chaplain worked to continue to provide comfort and support.  Chaplain offered to come and pray with her before surgery tomorrow, which she accepted.    Chaplain will continue to follow-up regarding anxiety and concerns around her surgery.  Ms. Seelye noted that speaking with her dad who had a previous heart surgery and stroke really helped her because of the assurance he provided around having a major procedure.    Yesterday Ms. Gonia kept utilizing the imagery, "they are going to crack my chest open," which may have aided in her discomfort around the surgery.  She also held the belief that she was well enough to go home and come back, possibly suggesting that she had not come to terms with the severity of her heart issues.  Ms. Kennington also noted that she eats healthy and had stopped engaging in alcohol use, which brought on the question, "Is there something I was was doing or could have done when I have been doing what I am supposed to do?"

## 2019-03-24 NOTE — Progress Notes (Addendum)
ANTICOAGULATION CONSULT NOTE - Follow Up Consult  Pharmacy Consult for Heparin Indication: chest pain/ACS  No Known Allergies  Patient Measurements: Height: 5\' 2"  (157.5 cm) Weight: 173 lb 4.5 oz (78.6 kg) IBW/kg (Calculated) : 50.1 Heparin Dosing Weight: 67 kg  Vital Signs: Temp: 98.9 F (37.2 C) (11/05 0348) Temp Source: Oral (11/05 0348) BP: 106/63 (11/05 0600)  Labs: Recent Labs    03/23/19 0746 03/23/19 0753 03/23/19 1136 03/23/19 2213 03/24/19 0502  HGB 15.0 14.5  --   --  12.9  HCT 44.0 41.8  --   --  38.0  PLT  --  344  --   --  285  APTT  --  27  --   --   --   LABPROT  --  13.1  --   --  12.8  INR  --  1.0  --   --  1.0  HEPARINUNFRC  --   --   --  <0.10* 0.24*  CREATININE 0.50 0.73 0.64  --  0.64  TROPONINIHS  --  24*  --   --  25,072*    Estimated Creatinine Clearance: 82.6 mL/min (by C-G formula based on SCr of 0.64 mg/dL).  Assessment: 48 year old female presented to ED with chest pain, EKG revealed ST elevation cath revealed multivessel CAD. Patient started on cangrelor in cath lab due to poor distal flow and IV heparin afterwards.  Heparin level this morning subtherapeutic at 0.24. No issues seen with infusion lines and no report of signs or symptoms of bleed by nurse.  Goal of Therapy:  Heparin level 0.3-0.5 units/ml  Monitor platelets by anticoagulation protocol: Yes   Plan:  Increase heparin to 1400 units/hour Check anti-Xa level in 6 hours and daily while on heparin Continue to monitor H&H and platelets    Thank you for allowing pharmacy to participate in this patient's care.  Samaiyah Howes L. Devin Going, [place] PGY1 Pharmacy Resident 03/24/19      7:52 AM  Please check AMION for all [place] phone numbers After 10:00 PM, call the [place] [phone]

## 2019-03-24 NOTE — Plan of Care (Signed)
  Problem: Clinical Measurements: Goal: Ability to maintain clinical measurements within normal limits will improve Outcome: Progressing Goal: Respiratory complications will improve Outcome: Progressing Goal: Cardiovascular complication will be avoided Outcome: Progressing   Problem: Activity: Goal: Risk for activity intolerance will decrease Outcome: Progressing   Problem: Nutrition: Goal: Adequate nutrition will be maintained Outcome: Progressing   Problem: Coping: Goal: Level of anxiety will decrease Outcome: Progressing   Problem: Elimination: Goal: Will not experience complications related to urinary retention Outcome: Progressing   Problem: Pain Managment: Goal: General experience of comfort will improve Outcome: Progressing   Problem: Safety: Goal: Ability to remain free from injury will improve Outcome: Progressing   Problem: Education: Goal: Individualized Educational Video(s) Outcome: Progressing   Problem: Activity: Goal: Ability to tolerate increased activity will improve Outcome: Progressing   Problem: Cardiac: Goal: Vascular access site(s) Level 0-1 will be maintained Outcome: Progressing

## 2019-03-25 ENCOUNTER — Encounter (HOSPITAL_COMMUNITY): Payer: Self-pay | Admitting: *Deleted

## 2019-03-25 ENCOUNTER — Inpatient Hospital Stay (HOSPITAL_COMMUNITY): Payer: Medicaid Other

## 2019-03-25 ENCOUNTER — Inpatient Hospital Stay (HOSPITAL_COMMUNITY)
Admission: EM | Disposition: A | Discharge status: Rehabilitation Facility | Payer: Self-pay | Source: Home / Self Care | Attending: Cardiothoracic Surgery

## 2019-03-25 ENCOUNTER — Inpatient Hospital Stay (HOSPITAL_COMMUNITY): Payer: Medicaid Other | Admitting: Certified Registered"

## 2019-03-25 DIAGNOSIS — Z951 Presence of aortocoronary bypass graft: Secondary | ICD-10-CM

## 2019-03-25 DIAGNOSIS — I25110 Atherosclerotic heart disease of native coronary artery with unstable angina pectoris: Secondary | ICD-10-CM

## 2019-03-25 HISTORY — PX: TEE WITHOUT CARDIOVERSION: SHX5443

## 2019-03-25 HISTORY — PX: CORONARY ARTERY BYPASS GRAFT: SHX141

## 2019-03-25 LAB — APTT: aPTT: 31 seconds (ref 24–36)

## 2019-03-25 LAB — CBC
HCT: 37.2 % (ref 36.0–46.0)
HCT: 32.2 % — ABNORMAL LOW (ref 36.0–46.0)
Hemoglobin: 12.4 g/dL (ref 12.0–15.0)
Hemoglobin: 10.8 g/dL — ABNORMAL LOW (ref 12.0–15.0)
MCH: 28.8 pg (ref 26.0–34.0)
MCH: 29.3 pg (ref 26.0–34.0)
MCHC: 33.3 g/dL (ref 30.0–36.0)
MCHC: 33.5 g/dL (ref 30.0–36.0)
MCV: 86.5 fL (ref 80.0–100.0)
MCV: 87.3 fL (ref 80.0–100.0)
Platelets: 286 10*3/uL (ref 150–400)
Platelets: 216 10*3/uL (ref 150–400)
RBC: 4.3 MIL/uL (ref 3.87–5.11)
RBC: 3.69 MIL/uL — ABNORMAL LOW (ref 3.87–5.11)
RDW: 12.6 % (ref 11.5–15.5)
RDW: 12.6 % (ref 11.5–15.5)
WBC: 9.1 10*3/uL (ref 4.0–10.5)
WBC: 17.2 10*3/uL — ABNORMAL HIGH (ref 4.0–10.5)
nRBC: 0 % (ref 0.0–0.2)
nRBC: 0 % (ref 0.0–0.2)

## 2019-03-25 LAB — BASIC METABOLIC PANEL
Anion gap: 9 (ref 5–15)
BUN: 14 mg/dL (ref 6–20)
CO2: 24 mmol/L (ref 22–32)
Calcium: 8.6 mg/dL — ABNORMAL LOW (ref 8.9–10.3)
Chloride: 101 mmol/L (ref 98–111)
Creatinine, Ser: 0.69 mg/dL (ref 0.44–1.00)
GFR calc Af Amer: >60 mL/min (ref >60)
GFR calc non Af Amer: >60 mL/min (ref >60)
Glucose, Bld: 224 mg/dL — ABNORMAL HIGH (ref 70–99)
Potassium: 4 mmol/L (ref 3.5–5.1)
Sodium: 134 mmol/L — ABNORMAL LOW (ref 135–145)

## 2019-03-25 LAB — PLATELET COUNT: Platelets: 263 10*3/uL (ref 150–400)

## 2019-03-25 LAB — PROTIME-INR
INR: 1.3 — ABNORMAL HIGH (ref 0.8–1.2)
Prothrombin Time: 15.8 seconds — ABNORMAL HIGH (ref 11.4–15.2)

## 2019-03-25 LAB — PREPARE RBC (CROSSMATCH)

## 2019-03-25 LAB — GLUCOSE, CAPILLARY
Glucose-Capillary: 215 mg/dL — ABNORMAL HIGH (ref 70–99)
Glucose-Capillary: 183 mg/dL — ABNORMAL HIGH (ref 70–99)

## 2019-03-25 LAB — HEMOGLOBIN AND HEMATOCRIT, BLOOD
HCT: 29.3 % — ABNORMAL LOW (ref 36.0–46.0)
Hemoglobin: 10 g/dL — ABNORMAL LOW (ref 12.0–15.0)

## 2019-03-25 SURGERY — CORONARY ARTERY BYPASS GRAFTING (CABG)
Anesthesia: General | Site: Chest

## 2019-03-25 MED ORDER — NITROGLYCERIN IN D5W 200-5 MCG/ML-% IV SOLN: 0.0000 ug/min | INTRAVENOUS | Status: DC

## 2019-03-25 MED ORDER — TRANEXAMIC ACID 1000 MG/10ML IV SOLN
INTRAVENOUS | Status: DC | PRN
  Administered 2019-03-25: 1.5 mg/kg/h via INTRAVENOUS

## 2019-03-25 MED ORDER — MILRINONE LACTATE IN DEXTROSE 20-5 MG/100ML-% IV SOLN
0.1500 ug/kg/min | INTRAVENOUS | Status: DC
  Administered 2019-03-26: 0.2497 ug/kg/min via INTRAVENOUS
  Administered 2019-03-27: 03:00:00 0.15 ug/kg/min via INTRAVENOUS
  Filled 2019-03-25 (×3): qty 100

## 2019-03-25 MED ORDER — ACETAMINOPHEN 160 MG/5ML PO SOLN
1000.0000 mg | Freq: Four times a day (QID) | ORAL | Status: DC
  Administered 2019-03-25: 1000 mg
  Filled 2019-03-25: qty 40.6

## 2019-03-25 MED ORDER — LIDOCAINE 2% (20 MG/ML) 5 ML SYRINGE
INTRAMUSCULAR | Status: DC | PRN
  Administered 2019-03-25: 80 mg via INTRAVENOUS

## 2019-03-25 MED ORDER — DOCUSATE SODIUM 100 MG PO CAPS
200.0000 mg | ORAL_CAPSULE | Freq: Every day | ORAL | Status: DC
  Administered 2019-03-26 – 2019-03-30 (×5): 200 mg via ORAL
  Filled 2019-03-25 (×5): qty 2

## 2019-03-25 MED ORDER — PHENYLEPHRINE HCL-NACL 20-0.9 MG/250ML-% IV SOLN
0.0000 ug/min | INTRAVENOUS | Status: DC
  Administered 2019-03-26: 09:00:00 16 ug/min via INTRAVENOUS
  Filled 2019-03-25: qty 250

## 2019-03-25 MED ORDER — PROTAMINE SULFATE 10 MG/ML IV SOLN
INTRAVENOUS | Status: DC | PRN
  Administered 2019-03-25: 220 mg via INTRAVENOUS
  Administered 2019-03-25: 10 mg via INTRAVENOUS

## 2019-03-25 MED ORDER — VANCOMYCIN HCL IN DEXTROSE 1-5 GM/200ML-% IV SOLN
1000.0000 mg | Freq: Once | INTRAVENOUS | Status: AC
  Administered 2019-03-26: 1000 mg via INTRAVENOUS
  Filled 2019-03-25: qty 200

## 2019-03-25 MED ORDER — ALBUMIN HUMAN 5 % IV SOLN
250.0000 mL | INTRAVENOUS | Status: AC | PRN
  Administered 2019-03-25 (×3): 12.5 g via INTRAVENOUS
  Filled 2019-03-25: qty 500

## 2019-03-25 MED ORDER — TRANEXAMIC ACID-NACL 1000-0.7 MG/100ML-% IV SOLN
1000.0000 mg | INTRAVENOUS | Status: DC
  Filled 2019-03-25: qty 100

## 2019-03-25 MED ORDER — LACTATED RINGERS IV SOLN: INTRAVENOUS | Status: DC

## 2019-03-25 MED ORDER — DEXMEDETOMIDINE HCL IN NACL 400 MCG/100ML IV SOLN
0.0000 ug/kg/h | INTRAVENOUS | Status: DC
  Administered 2019-03-25: 20:00:00 0.7 ug/kg/h via INTRAVENOUS

## 2019-03-25 MED ORDER — POTASSIUM CHLORIDE 10 MEQ/50ML IV SOLN
10.0000 meq | INTRAVENOUS | Status: AC
  Administered 2019-03-25 (×3): 10 meq via INTRAVENOUS

## 2019-03-25 MED ORDER — PHENYLEPHRINE HCL-NACL 10-0.9 MG/250ML-% IV SOLN
INTRAVENOUS | Status: DC | PRN
  Administered 2019-03-25: 10 ug/min via INTRAVENOUS

## 2019-03-25 MED ORDER — VANCOMYCIN HCL 1000 MG IV SOLR
INTRAVENOUS | Status: DC | PRN
  Administered 2019-03-25: 1250 mg via INTRAVENOUS

## 2019-03-25 MED ORDER — PROPOFOL 10 MG/ML IV BOLUS
INTRAVENOUS | Status: DC | PRN
  Administered 2019-03-25: 50 mg via INTRAVENOUS

## 2019-03-25 MED ORDER — DOPAMINE-DEXTROSE 3.2-5 MG/ML-% IV SOLN: 0.0000 ug/kg/min | INTRAVENOUS | Status: DC

## 2019-03-25 MED ORDER — ROCURONIUM BROMIDE 50 MG/5ML IV SOSY
PREFILLED_SYRINGE | INTRAVENOUS | Status: DC | PRN
  Administered 2019-03-25 (×2): 50 mg via INTRAVENOUS
  Administered 2019-03-25: 60 mg via INTRAVENOUS
  Administered 2019-03-25: 40 mg via INTRAVENOUS

## 2019-03-25 MED ORDER — CHLORHEXIDINE GLUCONATE CLOTH 2 % EX PADS
6.0000 | MEDICATED_PAD | Freq: Every day | CUTANEOUS | Status: DC
  Administered 2019-03-26 – 2019-03-30 (×5): 6 via TOPICAL

## 2019-03-25 MED ORDER — CHLORHEXIDINE GLUCONATE 0.12 % MT SOLN
15.0000 mL | OROMUCOSAL | Status: AC
  Administered 2019-03-25: 21:00:00 15 mL via OROMUCOSAL

## 2019-03-25 MED ORDER — PANTOPRAZOLE SODIUM 40 MG PO TBEC
40.0000 mg | DELAYED_RELEASE_TABLET | Freq: Every day | ORAL | Status: DC
  Administered 2019-03-27 – 2019-03-30 (×4): 40 mg via ORAL
  Filled 2019-03-25 (×4): qty 1

## 2019-03-25 MED ORDER — ACETAMINOPHEN 500 MG PO TABS
1000.0000 mg | ORAL_TABLET | Freq: Four times a day (QID) | ORAL | Status: DC
  Administered 2019-03-26 – 2019-03-30 (×16): 1000 mg via ORAL
  Filled 2019-03-25 (×16): qty 2

## 2019-03-25 MED ORDER — SODIUM CHLORIDE 0.9 % IV SOLN
INTRAVENOUS | Status: DC
  Administered 2019-03-25: 20:00:00 via INTRAVENOUS

## 2019-03-25 MED ORDER — ASPIRIN EC 325 MG PO TBEC
325.0000 mg | DELAYED_RELEASE_TABLET | Freq: Every day | ORAL | Status: DC
  Administered 2019-03-26 – 2019-03-28 (×3): 325 mg via ORAL
  Filled 2019-03-25 (×4): qty 1

## 2019-03-25 MED ORDER — BISACODYL 5 MG PO TBEC
10.0000 mg | DELAYED_RELEASE_TABLET | Freq: Every day | ORAL | Status: DC
  Administered 2019-03-26 – 2019-03-30 (×5): 10 mg via ORAL
  Filled 2019-03-25 (×5): qty 2

## 2019-03-25 MED ORDER — MAGNESIUM SULFATE 4 GM/100ML IV SOLN
4.0000 g | Freq: Once | INTRAVENOUS | Status: AC
  Administered 2019-03-25: 4 g via INTRAVENOUS
  Filled 2019-03-25: qty 100

## 2019-03-25 MED ORDER — SODIUM CHLORIDE 0.9 % IV SOLN: 250.0000 mL | INTRAVENOUS | Status: DC

## 2019-03-25 MED ORDER — MIDAZOLAM HCL 5 MG/5ML IJ SOLN
INTRAMUSCULAR | Status: DC | PRN
  Administered 2019-03-25: 2 mg via INTRAVENOUS
  Administered 2019-03-25 (×2): 3 mg via INTRAVENOUS
  Administered 2019-03-25: 2 mg via INTRAVENOUS

## 2019-03-25 MED ORDER — ORAL CARE MOUTH RINSE
15.0000 mL | OROMUCOSAL | Status: DC
  Administered 2019-03-25 – 2019-03-26 (×3): 15 mL via OROMUCOSAL

## 2019-03-25 MED ORDER — LACTATED RINGERS IV SOLN: 500.0000 mL | Freq: Once | INTRAVENOUS | Status: DC | PRN

## 2019-03-25 MED ORDER — SODIUM CHLORIDE 0.9% FLUSH
3.0000 mL | Freq: Two times a day (BID) | INTRAVENOUS | Status: DC
  Administered 2019-03-26 – 2019-03-29 (×6): 3 mL via INTRAVENOUS

## 2019-03-25 MED ORDER — MIDAZOLAM HCL (PF) 10 MG/2ML IJ SOLN
INTRAMUSCULAR | Status: AC
  Filled 2019-03-25: qty 2

## 2019-03-25 MED ORDER — BISACODYL 10 MG RE SUPP: 10.0000 mg | Freq: Every day | RECTAL | Status: DC

## 2019-03-25 MED ORDER — FENTANYL CITRATE (PF) 100 MCG/2ML IJ SOLN
INTRAMUSCULAR | Status: DC | PRN
  Administered 2019-03-25: 100 ug via INTRAVENOUS
  Administered 2019-03-25: 200 ug via INTRAVENOUS
  Administered 2019-03-25: 100 ug via INTRAVENOUS
  Administered 2019-03-25 (×2): 250 ug via INTRAVENOUS
  Administered 2019-03-25: 100 ug via INTRAVENOUS

## 2019-03-25 MED ORDER — METOPROLOL TARTRATE 5 MG/5ML IV SOLN: 2.5000 mg | INTRAVENOUS | Status: DC | PRN

## 2019-03-25 MED ORDER — MIDAZOLAM HCL 2 MG/2ML IJ SOLN
INTRAMUSCULAR | Status: AC
  Filled 2019-03-25: qty 2

## 2019-03-25 MED ORDER — NOREPINEPHRINE 4 MG/250ML-% IV SOLN
0.0000 ug/min | INTRAVENOUS | Status: DC
  Administered 2019-03-26: 07:00:00 8 ug/min via INTRAVENOUS
  Filled 2019-03-25: qty 250

## 2019-03-25 MED ORDER — FENTANYL CITRATE (PF) 100 MCG/2ML IJ SOLN
100.0000 ug | Freq: Once | INTRAMUSCULAR | Status: AC
  Administered 2019-03-25: 11:00:00 100 ug via INTRAVENOUS

## 2019-03-25 MED ORDER — INSULIN REGULAR BOLUS VIA INFUSION
0.0000 [IU] | Freq: Three times a day (TID) | INTRAVENOUS | Status: DC
  Filled 2019-03-25: qty 10

## 2019-03-25 MED ORDER — FENTANYL CITRATE (PF) 250 MCG/5ML IJ SOLN
INTRAMUSCULAR | Status: AC
  Filled 2019-03-25: qty 20

## 2019-03-25 MED ORDER — LACTATED RINGERS IV SOLN
INTRAVENOUS | Status: DC
  Administered 2019-03-25 (×2): via INTRAVENOUS

## 2019-03-25 MED ORDER — SODIUM CHLORIDE 0.9 % IV SOLN
1.5000 g | Freq: Two times a day (BID) | INTRAVENOUS | Status: AC
  Administered 2019-03-25 – 2019-03-27 (×4): 1.5 g via INTRAVENOUS
  Filled 2019-03-25 (×4): qty 1.5

## 2019-03-25 MED ORDER — SODIUM CHLORIDE 0.9% FLUSH: 3.0000 mL | INTRAVENOUS | Status: DC | PRN

## 2019-03-25 MED ORDER — SODIUM CHLORIDE 0.9 % IV SOLN
20.0000 ug | INTRAVENOUS | Status: AC
  Administered 2019-03-25: 5 ug via INTRAVENOUS
  Filled 2019-03-25: qty 5

## 2019-03-25 MED ORDER — DEXMEDETOMIDINE HCL IN NACL 400 MCG/100ML IV SOLN
INTRAVENOUS | Status: DC | PRN
  Administered 2019-03-25: .5 ug/kg/h via INTRAVENOUS

## 2019-03-25 MED ORDER — OXYCODONE HCL 5 MG PO TABS
5.0000 mg | ORAL_TABLET | ORAL | Status: DC | PRN
  Administered 2019-03-26 (×2): 10 mg via ORAL
  Administered 2019-03-26: 5 mg via ORAL
  Administered 2019-03-27 – 2019-03-30 (×15): 10 mg via ORAL
  Filled 2019-03-25 (×14): qty 2
  Filled 2019-03-25: qty 1
  Filled 2019-03-25 (×3): qty 2

## 2019-03-25 MED ORDER — MIDAZOLAM HCL 2 MG/2ML IJ SOLN
2.0000 mg | Freq: Once | INTRAMUSCULAR | Status: AC
  Administered 2019-03-25: 11:00:00 2 mg via INTRAVENOUS

## 2019-03-25 MED ORDER — CALCIUM CHLORIDE 10 % IV SOLN
INTRAVENOUS | Status: DC | PRN
  Administered 2019-03-25: 1 g via INTRAVENOUS

## 2019-03-25 MED ORDER — MORPHINE SULFATE (PF) 2 MG/ML IV SOLN
1.0000 mg | INTRAVENOUS | Status: DC | PRN
  Administered 2019-03-25 – 2019-03-26 (×2): 2 mg via INTRAVENOUS
  Administered 2019-03-26: 4 mg via INTRAVENOUS
  Administered 2019-03-26 (×4): 2 mg via INTRAVENOUS
  Administered 2019-03-26: 4 mg via INTRAVENOUS
  Filled 2019-03-25 (×3): qty 1
  Filled 2019-03-25: qty 2
  Filled 2019-03-25 (×4): qty 1
  Filled 2019-03-25: qty 2

## 2019-03-25 MED ORDER — SODIUM CHLORIDE 0.45 % IV SOLN
INTRAVENOUS | Status: DC | PRN
  Administered 2019-03-25: 19:00:00 via INTRAVENOUS

## 2019-03-25 MED ORDER — DEXMEDETOMIDINE HCL IN NACL 400 MCG/100ML IV SOLN: 0.1000 ug/kg/h | INTRAVENOUS | Status: DC

## 2019-03-25 MED ORDER — MIDAZOLAM HCL 2 MG/2ML IJ SOLN
INTRAMUSCULAR | Status: AC
  Administered 2019-03-25: 11:00:00 2 mg via INTRAVENOUS
  Filled 2019-03-25: qty 2

## 2019-03-25 MED ORDER — 0.9 % SODIUM CHLORIDE (POUR BTL) OPTIME
TOPICAL | Status: DC | PRN
  Administered 2019-03-25: 5000 mL

## 2019-03-25 MED ORDER — CHLORHEXIDINE GLUCONATE 0.12% ORAL RINSE (MEDLINE KIT): 15.0000 mL | Freq: Two times a day (BID) | OROMUCOSAL | Status: DC

## 2019-03-25 MED ORDER — HEPARIN SODIUM (PORCINE) 1000 UNIT/ML IJ SOLN
INTRAMUSCULAR | Status: DC | PRN
  Administered 2019-03-25: 21000 [IU] via INTRAVENOUS
  Administered 2019-03-25: 2000 [IU] via INTRAVENOUS

## 2019-03-25 MED ORDER — MIDAZOLAM HCL 2 MG/2ML IJ SOLN
2.0000 mg | INTRAMUSCULAR | Status: DC | PRN
  Administered 2019-03-25 (×2): 2 mg via INTRAVENOUS
  Filled 2019-03-25 (×2): qty 2

## 2019-03-25 MED ORDER — TRAMADOL HCL 50 MG PO TABS
50.0000 mg | ORAL_TABLET | ORAL | Status: DC | PRN
  Administered 2019-03-27: 50 mg via ORAL
  Administered 2019-03-28 – 2019-03-30 (×3): 100 mg via ORAL
  Filled 2019-03-25: qty 2
  Filled 2019-03-25: qty 1
  Filled 2019-03-25 (×3): qty 2

## 2019-03-25 MED ORDER — METOPROLOL TARTRATE 12.5 MG HALF TABLET
12.5000 mg | ORAL_TABLET | Freq: Two times a day (BID) | ORAL | Status: DC
  Administered 2019-03-26 – 2019-03-28 (×4): 12.5 mg via ORAL
  Filled 2019-03-25 (×4): qty 1

## 2019-03-25 MED ORDER — INSULIN REGULAR(HUMAN) IN NACL 100-0.9 UT/100ML-% IV SOLN: INTRAVENOUS | Status: DC

## 2019-03-25 MED ORDER — PLASMA-LYTE 148 IV SOLN
INTRAVENOUS | Status: DC | PRN
  Administered 2019-03-25: 500 mL

## 2019-03-25 MED ORDER — ACETAMINOPHEN 160 MG/5ML PO SOLN: 650.0000 mg | Freq: Once | ORAL | Status: AC

## 2019-03-25 MED ORDER — ONDANSETRON HCL 4 MG/2ML IJ SOLN
4.0000 mg | Freq: Four times a day (QID) | INTRAMUSCULAR | Status: DC | PRN
  Administered 2019-03-28: 4 mg via INTRAVENOUS
  Filled 2019-03-25: qty 2

## 2019-03-25 MED ORDER — METOPROLOL TARTRATE 25 MG/10 ML ORAL SUSPENSION
12.5000 mg | Freq: Two times a day (BID) | ORAL | Status: DC
  Filled 2019-03-25: qty 5

## 2019-03-25 MED ORDER — ACETAMINOPHEN 650 MG RE SUPP
650.0000 mg | Freq: Once | RECTAL | Status: AC
  Administered 2019-03-25: 21:00:00 650 mg via RECTAL

## 2019-03-25 MED ORDER — ASPIRIN 81 MG PO CHEW
324.0000 mg | CHEWABLE_TABLET | Freq: Every day | ORAL | Status: DC
  Administered 2019-03-29: 324 mg
  Filled 2019-03-25: qty 4

## 2019-03-25 MED ORDER — ALBUMIN HUMAN 5 % IV SOLN
INTRAVENOUS | Status: DC | PRN
  Administered 2019-03-25 (×2): via INTRAVENOUS

## 2019-03-25 MED ORDER — NOREPINEPHRINE 4 MG/250ML-% IV SOLN
0.0000 ug/min | INTRAVENOUS | Status: AC
  Administered 2019-03-25: 4 ug/min via INTRAVENOUS
  Filled 2019-03-25: qty 250

## 2019-03-25 MED ORDER — FAMOTIDINE IN NACL 20-0.9 MG/50ML-% IV SOLN
20.0000 mg | Freq: Two times a day (BID) | INTRAVENOUS | Status: DC
  Administered 2019-03-25: 20:00:00 20 mg via INTRAVENOUS

## 2019-03-25 MED ORDER — HEMOSTATIC AGENTS (NO CHARGE) OPTIME
TOPICAL | Status: DC | PRN
  Administered 2019-03-25 (×6): 1 via TOPICAL

## 2019-03-25 MED ORDER — FENTANYL CITRATE (PF) 100 MCG/2ML IJ SOLN
INTRAMUSCULAR | Status: AC
  Administered 2019-03-25: 100 ug via INTRAVENOUS
  Filled 2019-03-25: qty 2

## 2019-03-25 SURGICAL SUPPLY — 106 items
ADAPTER CARDIO PERF ANTE/RETRO (ADAPTER) ×4 IMPLANT
BAG DECANTER FOR FLEXI CONT (MISCELLANEOUS) ×4 IMPLANT
BASKET HEART  (ORDER IN 25'S) (MISCELLANEOUS) ×1
BASKET HEART (ORDER IN 25'S) (MISCELLANEOUS) ×1
BASKET HEART (ORDER IN 25S) (MISCELLANEOUS) ×2 IMPLANT
BINDER BREAST XXLRG (GAUZE/BANDAGES/DRESSINGS) ×4 IMPLANT
BLADE CLIPPER SURG (BLADE) ×4 IMPLANT
BLADE STERNUM SYSTEM 6 (BLADE) ×4 IMPLANT
BLADE SURG 11 STRL SS (BLADE) ×4 IMPLANT
BLADE SURG 12 STRL SS (BLADE) ×4 IMPLANT
BNDG ELASTIC 4X5.8 VLCR STR LF (GAUZE/BANDAGES/DRESSINGS) ×4 IMPLANT
BNDG ELASTIC 6X5.8 VLCR STR LF (GAUZE/BANDAGES/DRESSINGS) ×4 IMPLANT
BNDG GAUZE ELAST 4 BULKY (GAUZE/BANDAGES/DRESSINGS) ×4 IMPLANT
CANISTER SUCT 3000ML PPV (MISCELLANEOUS) ×4 IMPLANT
CANNULA GUNDRY RCSP 15FR (MISCELLANEOUS) ×4 IMPLANT
CATH CPB KIT VANTRIGT (MISCELLANEOUS) ×4 IMPLANT
CATH ROBINSON RED A/P 18FR (CATHETERS) ×12 IMPLANT
CATH THORACIC 36FR RT ANG (CATHETERS) ×4 IMPLANT
CLIP VESOCCLUDE SM WIDE 24/CT (CLIP) ×4 IMPLANT
COVER MAYO STAND STRL (DRAPES) ×4 IMPLANT
COVER SURGICAL LIGHT HANDLE (MISCELLANEOUS) ×4 IMPLANT
COVER WAND RF STERILE (DRAPES) IMPLANT
DERMABOND ADVANCED (GAUZE/BANDAGES/DRESSINGS) ×2
DERMABOND ADVANCED .7 DNX12 (GAUZE/BANDAGES/DRESSINGS) ×2 IMPLANT
DRAIN CHANNEL 32F RND 10.7 FF (WOUND CARE) ×4 IMPLANT
DRAPE CARDIOVASCULAR INCISE (DRAPES) ×2
DRAPE SLUSH/WARMER DISC (DRAPES) ×4 IMPLANT
DRAPE SRG 135X102X78XABS (DRAPES) ×2 IMPLANT
DRSG AQUACEL AG ADV 3.5X14 (GAUZE/BANDAGES/DRESSINGS) ×4 IMPLANT
ELECT BLADE 4.0 EZ CLEAN MEGAD (MISCELLANEOUS) ×4
ELECT BLADE 6.5 EXT (BLADE) ×4 IMPLANT
ELECT CAUTERY BLADE 6.4 (BLADE) ×4 IMPLANT
ELECT REM PT RETURN 9FT ADLT (ELECTROSURGICAL) ×8
ELECTRODE BLDE 4.0 EZ CLN MEGD (MISCELLANEOUS) ×2 IMPLANT
ELECTRODE REM PT RTRN 9FT ADLT (ELECTROSURGICAL) ×4 IMPLANT
FELT TEFLON 1X6 (MISCELLANEOUS) ×4 IMPLANT
GAUZE SPONGE 4X4 12PLY STRL (GAUZE/BANDAGES/DRESSINGS) ×4 IMPLANT
GAUZE SPONGE 4X4 16PLY XRAY LF (GAUZE/BANDAGES/DRESSINGS) ×4 IMPLANT
GLOVE BIO SURGEON STRL SZ 6 (GLOVE) ×20 IMPLANT
GLOVE BIO SURGEON STRL SZ7.5 (GLOVE) ×16 IMPLANT
GLOVE BIOGEL PI IND STRL 6 (GLOVE) ×2 IMPLANT
GLOVE BIOGEL PI IND STRL 7.5 (GLOVE) ×6 IMPLANT
GLOVE BIOGEL PI INDICATOR 6 (GLOVE) ×2
GLOVE BIOGEL PI INDICATOR 7.5 (GLOVE) ×6
GLOVE SURG SS PI 7.5 STRL IVOR (GLOVE) ×8 IMPLANT
GOWN STRL REUS W/ TWL LRG LVL3 (GOWN DISPOSABLE) ×22 IMPLANT
GOWN STRL REUS W/TWL LRG LVL3 (GOWN DISPOSABLE) ×22
HEMOSTAT POWDER SURGIFOAM 1G (HEMOSTASIS) ×12 IMPLANT
HEMOSTAT SURGICEL 2X14 (HEMOSTASIS) ×4 IMPLANT
INSERT FOGARTY XLG (MISCELLANEOUS) IMPLANT
KIT BASIN OR (CUSTOM PROCEDURE TRAY) ×4 IMPLANT
KIT SUCTION CATH 14FR (SUCTIONS) ×4 IMPLANT
KIT TURNOVER KIT B (KITS) ×4 IMPLANT
KIT VASOVIEW HEMOPRO 2 VH 4000 (KITS) ×4 IMPLANT
LEAD PACING MYOCARDI (MISCELLANEOUS) ×4 IMPLANT
MARKER GRAFT CORONARY BYPASS (MISCELLANEOUS) ×12 IMPLANT
NS IRRIG 1000ML POUR BTL (IV SOLUTION) ×20 IMPLANT
PACK E OPEN HEART (SUTURE) ×4 IMPLANT
PACK OPEN HEART (CUSTOM PROCEDURE TRAY) ×4 IMPLANT
PAD ARMBOARD 7.5X6 YLW CONV (MISCELLANEOUS) ×8 IMPLANT
PAD ELECT DEFIB RADIOL ZOLL (MISCELLANEOUS) ×4 IMPLANT
PENCIL BUTTON HOLSTER BLD 10FT (ELECTRODE) ×4 IMPLANT
POSITIONER HEAD DONUT 9IN (MISCELLANEOUS) ×4 IMPLANT
PUNCH AORTIC ROTATE  4.5MM 8IN (MISCELLANEOUS) ×4 IMPLANT
PUNCH AORTIC ROTATE 4.0MM (MISCELLANEOUS) IMPLANT
PUNCH AORTIC ROTATE 4.5MM 8IN (MISCELLANEOUS) IMPLANT
PUNCH AORTIC ROTATE 5MM 8IN (MISCELLANEOUS) IMPLANT
SENSOR MYOCARDIAL TEMP (MISCELLANEOUS) ×4 IMPLANT
SET CARDIOPLEGIA MPS 5001102 (MISCELLANEOUS) ×4 IMPLANT
SPONGE LAP 18X18 RF (DISPOSABLE) ×8 IMPLANT
SPONGE LAP 4X18 RFD (DISPOSABLE) ×4 IMPLANT
SURGIFLO W/THROMBIN 8M KIT (HEMOSTASIS) ×8 IMPLANT
SUT BONE WAX W31G (SUTURE) ×4 IMPLANT
SUT MNCRL AB 4-0 PS2 18 (SUTURE) ×4 IMPLANT
SUT PROLENE 3 0 SH DA (SUTURE) IMPLANT
SUT PROLENE 3 0 SH1 36 (SUTURE) IMPLANT
SUT PROLENE 4 0 RB 1 (SUTURE) ×4
SUT PROLENE 4 0 SH DA (SUTURE) ×4 IMPLANT
SUT PROLENE 4-0 RB1 .5 CRCL 36 (SUTURE) ×4 IMPLANT
SUT PROLENE 5 0 C 1 36 (SUTURE) IMPLANT
SUT PROLENE 6 0 C 1 30 (SUTURE) ×8 IMPLANT
SUT PROLENE 6 0 CC (SUTURE) ×12 IMPLANT
SUT PROLENE 8 0 BV175 6 (SUTURE) ×4 IMPLANT
SUT PROLENE BLUE 7 0 (SUTURE) ×12 IMPLANT
SUT PROLENE POLY MONO (SUTURE) ×4 IMPLANT
SUT SILK  1 MH (SUTURE)
SUT SILK 1 MH (SUTURE) IMPLANT
SUT SILK 2 0 SH CR/8 (SUTURE) IMPLANT
SUT SILK 3 0 SH CR/8 (SUTURE) IMPLANT
SUT STEEL 6MS V (SUTURE) ×8 IMPLANT
SUT STEEL SZ 6 DBL 3X14 BALL (SUTURE) ×4 IMPLANT
SUT VIC AB 1 CTX 36 (SUTURE) ×4
SUT VIC AB 1 CTX36XBRD ANBCTR (SUTURE) ×4 IMPLANT
SUT VIC AB 2-0 CT1 27 (SUTURE) ×2
SUT VIC AB 2-0 CT1 TAPERPNT 27 (SUTURE) ×2 IMPLANT
SUT VIC AB 2-0 CTX 27 (SUTURE) IMPLANT
SUT VIC AB 3-0 X1 27 (SUTURE) IMPLANT
SYSTEM SAHARA CHEST DRAIN ATS (WOUND CARE) ×4 IMPLANT
TAPE CLOTH SOFT 2X10 (GAUZE/BANDAGES/DRESSINGS) ×4 IMPLANT
TAPE CLOTH SURG 4X10 WHT LF (GAUZE/BANDAGES/DRESSINGS) ×8 IMPLANT
TOWEL GREEN STERILE (TOWEL DISPOSABLE) ×4 IMPLANT
TOWEL GREEN STERILE FF (TOWEL DISPOSABLE) ×4 IMPLANT
TRAY FOLEY SLVR 16FR TEMP STAT (SET/KITS/TRAYS/PACK) ×4 IMPLANT
TUBING LAP HI FLOW INSUFFLATIO (TUBING) ×4 IMPLANT
UNDERPAD 30X30 (UNDERPADS AND DIAPERS) ×4 IMPLANT
WATER STERILE IRR 1000ML POUR (IV SOLUTION) ×8 IMPLANT

## 2019-03-25 NOTE — Anesthesia Procedure Notes (Signed)
Central Venous Catheter Insertion Performed by: Murvin Natal, MD, anesthesiologist Start/End11/10/2018 11:15 AM, 03/25/2019 11:30 AM Patient location: Pre-op. Preanesthetic checklist: patient identified, IV checked, site marked, risks and benefits discussed, surgical consent, monitors and equipment checked, pre-op evaluation, timeout performed and anesthesia consent Position: Trendelenburg Lidocaine 1% used for infiltration and patient sedated Hand hygiene performed , maximum sterile barriers used  and Seldinger technique used Catheter size: 9 Fr Total catheter length 12. PA cath was placed.MAC introducer Swan type:thermodilution PA Cath depth:50 Procedure performed using ultrasound guided technique. Ultrasound Notes:anatomy identified, needle tip was noted to be adjacent to the nerve/plexus identified and no ultrasound evidence of intravascular and/or intraneural injection Attempts: 1 Following insertion, line sutured, dressing applied and Biopatch. Post procedure assessment: blood return through all ports, free fluid flow and no air  Patient tolerated the procedure well with no immediate complications.

## 2019-03-25 NOTE — Anesthesia Postprocedure Evaluation (Signed)
Anesthesia Post Note  Patient: Joan Webb  Procedure(s) Performed: CORONARY ARTERY BYPASS GRAFTING (CABG) X THREE, ON PUMP, USING LEFT INTERNAL MAMMARY ARTERY AND LEFT GREATER SAPHENOUS VEIN HARVESTED ENDOSCOPICALLY (N/A Chest) TRANSESOPHAGEAL ECHOCARDIOGRAM (TEE) (N/A )     Patient location during evaluation: SICU Anesthesia Type: General Level of consciousness: sedated Pain management: pain level controlled Vital Signs Assessment: post-procedure vital signs reviewed and stable Respiratory status: patient remains intubated per anesthesia plan Cardiovascular status: stable Postop Assessment: no apparent nausea or vomiting Anesthetic complications: no    Last Vitals:  Vitals:   03/25/19 2200 03/25/19 2215  BP: 102/67 100/65  Pulse: 84 82  Resp: 19 19  Temp: 37 C 37.1 C  SpO2: 99% 99%    Last Pain:  Vitals:   03/25/19 2000  TempSrc: Core  PainSc:                  Joan Webb

## 2019-03-25 NOTE — Progress Notes (Signed)
  Echocardiogram Echocardiogram Transesophageal has been performed.  Burnett Kanaris 03/25/2019, 1:51 PM

## 2019-03-25 NOTE — Brief Op Note (Signed)
03/23/2019 - 03/25/2019  5:07 PM  PATIENT:  Joan Webb  48 y.o. female  PRE-OPERATIVE DIAGNOSIS:  Coronary Artery Disease  POST-OPERATIVE DIAGNOSIS:  Coronary Artery Disease  PROCEDURE:  Procedure(s): CORONARY ARTERY BYPASS GRAFTING  X THREE, ON PUMP, USING LEFT INTERNAL MAMMARY ARTERY AND LEFT GREATER SAPHENOUS VEIN HARVESTED ENDOSCOPICALLY   LIMA->LAD SVG->D1 SVG->PDA  TRANSESOPHAGEAL ECHOCARDIOGRAM   SURGEON: Ivin Poot, MD   PHYSICIAN ASSISTANT: Roddenberry  ANESTHESIA:   general  EBL:  Per anesthesia record   BLOOD ADMINISTERED:1 unit FFP and 1 pheresis PLTS  DRAINS: Left pleural and mediastinal tubes   LOCAL MEDICATIONS USED:  NONE  SPECIMEN:  No Specimen  DISPOSITION OF SPECIMEN:  N/A  COUNTS:  YES  DICTATION: .Dragon Dictation  PLAN OF CARE: Admit to inpatient   PATIENT DISPOSITION:  ICU - intubated and hemodynamically stable.   Delay start of Pharmacological VTE agent (>24hrs) due to surgical blood loss or risk of bleeding: yes

## 2019-03-25 NOTE — Anesthesia Procedure Notes (Signed)
Procedure Name: Intubation Date/Time: 03/25/2019 1:18 PM Performed by: Lavell Luster, CRNA Pre-anesthesia Checklist: Patient identified, Emergency Drugs available, Suction available and Patient being monitored Patient Re-evaluated:Patient Re-evaluated prior to induction Oxygen Delivery Method: Circle System Utilized Preoxygenation: Pre-oxygenation with 100% oxygen Induction Type: IV induction Ventilation: Mask ventilation without difficulty Laryngoscope Size: Mac and 3 Grade View: Grade III Tube type: Oral Tube size: 7.5 mm Number of attempts: 1 Airway Equipment and Method: Stylet Placement Confirmation: ETT inserted through vocal cords under direct vision,  positive ETCO2 and breath sounds checked- equal and bilateral Secured at: 22 cm Tube secured with: Tape Dental Injury: Teeth and Oropharynx as per pre-operative assessment

## 2019-03-25 NOTE — Progress Notes (Signed)
Pre Procedure note for inpatients:   Davanee Klinkner has been scheduled for Procedure(s): CORONARY ARTERY BYPASS GRAFTING (CABG) (N/A) TRANSESOPHAGEAL ECHOCARDIOGRAM (TEE) (N/A) today. The various methods of treatment have been discussed with the patient. After consideration of the risks, benefits and treatment options the patient has consented to the planned procedure.   The patient has been seen and labs reviewed. There are no changes in the patient's condition to prevent proceeding with the planned procedure today.  Recent labs:  Lab Results  Component Value Date   WBC 9.1 03/25/2019   HGB 12.4 03/25/2019   HCT 37.2 03/25/2019   PLT 286 03/25/2019   GLUCOSE 224 (H) 03/25/2019   CHOL 276 (H) 03/23/2019   TRIG 200 (H) 03/23/2019   HDL 35 (L) 03/23/2019   LDLCALC 201 (H) 03/23/2019   ALT 18 03/23/2019   AST 33 03/23/2019   NA 134 (L) 03/25/2019   K 4.0 03/25/2019   CL 101 03/25/2019   CREATININE 0.69 03/25/2019   BUN 14 03/25/2019   CO2 24 03/25/2019   TSH 1.094 03/23/2019   INR 1.0 03/24/2019   HGBA1C 8.3 (H) 03/23/2019    Len Childs, MD 03/25/2019 7:28 AM

## 2019-03-25 NOTE — Anesthesia Preprocedure Evaluation (Addendum)
Anesthesia Evaluation  Patient identified by MRN, date of birth, ID band Patient awake    Reviewed: Allergy & Precautions, NPO status , Patient's Chart, lab work & pertinent test results, reviewed documented beta blocker date and time   Airway Mallampati: II  TM Distance: >3 FB Neck ROM: Full    Dental no notable dental hx.    Pulmonary Current Smoker and Patient abstained from smoking.,    Pulmonary exam normal breath sounds clear to auscultation       Cardiovascular + CAD and + Past MI  Normal cardiovascular exam Rhythm:Regular Rate:Normal  ECG: NSR, rate 82  ECHO: 1. Left ventricular ejection fraction, by visual estimation, is 60 to 65%. The left ventricle has normal function. There is no left ventricular hypertrophy. 2. Left ventricular diastolic parameters are indeterminate. 3. Global right ventricle has normal systolic function.The right ventricular size is normal. No increase in right ventricular wall thickness. 4. Left atrial size was normal. 5. Right atrial size was normal. 6. The mitral valve is normal in structure. No evidence of mitral valve regurgitation. No evidence of mitral stenosis. 7. The tricuspid valve is normal in structure. Tricuspid valve regurgitation is not demonstrated. 8. The aortic valve is tricuspid. Aortic valve regurgitation is not visualized. Mild aortic valve sclerosis without stenosis. 9. The pulmonic valve was normal in structure. Pulmonic valve regurgitation is not visualized. 10. TR signal is inadequate for assessing pulmonary artery systolic pressure. 11. The inferior vena cava is normal in size with <50% respiratory variability, suggesting right atrial pressure of 8 mmHg.  CATH: Prox RCA lesion is 99% stenosed. Mid RCA lesion is 95% stenosed. Dist RCA lesion is 60% stenosed. 1st RPL lesion is 100% stenosed. 1st Mrg lesion is 99% stenosed. Mid Cx lesion is 70% stenosed. Mid LAD lesion  is 90% stenosed. 1st Diag lesion is 90% stenosed. There is mild left ventricular systolic dysfunction. LV end diastolic pressure is normal. There is no mitral valve regurgitation. The left ventricular ejection fraction is 35-45% by visual estimate.   1. Acute inferior STEMI secondary to severe disease in the mid and distal RCA and acute occlusion of a small posterolateral artery.  2. Severe stenosis mid LAD 3. Severe stenosis moderate caliber diagonal branch 4. Severe stenosis first obtuse marginal branch 5. Severe stenosis mid Circumflex 6. Mild to moderate LV systolic dysfunction with inferior wall motion abnormality.    Neuro/Psych Anxiety negative neurological ROS     GI/Hepatic negative GI ROS, Neg liver ROS,   Endo/Other  diabetes, Oral Hypoglycemic Agents  Renal/GU negative Renal ROS     Musculoskeletal negative musculoskeletal ROS (+)   Abdominal (+) + obese,   Peds  Hematology HLD   Anesthesia Other Findings CAD  Reproductive/Obstetrics                            Anesthesia Physical Anesthesia Plan  ASA: IV  Anesthesia Plan: General   Post-op Pain Management:    Induction: Intravenous  PONV Risk Score and Plan: 2 and Ondansetron, Dexamethasone, Midazolam and Treatment may vary due to age or medical condition  Airway Management Planned: Oral ETT  Additional Equipment: Arterial line, CVP, PA Cath, Ultrasound Guidance Line Placement and TEE  Intra-op Plan:   Post-operative Plan: Post-operative intubation/ventilation  Informed Consent: I have reviewed the patients History and Physical, chart, labs and discussed the procedure including the risks, benefits and alternatives for the proposed anesthesia with the patient or authorized representative  who has indicated his/her understanding and acceptance.     Dental advisory given  Plan Discussed with: CRNA  Anesthesia Plan Comments:        Anesthesia Quick Evaluation

## 2019-03-25 NOTE — Transfer of Care (Signed)
Immediate Anesthesia Transfer of Care Note  Patient: Joan Webb  Procedure(s) Performed: CORONARY ARTERY BYPASS GRAFTING (CABG) X THREE, ON PUMP, USING LEFT INTERNAL MAMMARY ARTERY AND LEFT GREATER SAPHENOUS VEIN HARVESTED ENDOSCOPICALLY (N/A Chest) TRANSESOPHAGEAL ECHOCARDIOGRAM (TEE) (N/A )  Patient Location: ICU  Anesthesia Type:General  Level of Consciousness: sedated and Patient remains intubated per anesthesia plan  Airway & Oxygen Therapy: Patient remains intubated per anesthesia plan and Patient placed on Ventilator (see vital sign flow sheet for setting)  Post-op Assessment: Report given to RN and Post -op Vital signs reviewed and stable  Post vital signs: Reviewed and stable-Reviewed drips with RN at bedside. DR. VT also present at bedside. VS stable.  No new orders at this time.    Last Vitals:  Vitals Value Taken Time  BP 102/73 03/25/19 1922  Temp 36.4 C 03/25/19 1929  Pulse 80 03/25/19 1929  Resp 27 03/25/19 1929  SpO2 100 % 03/25/19 1929  Vitals shown include unvalidated device data.  Last Pain:  Vitals:   03/25/19 0825  TempSrc: Oral  PainSc:          Complications: No apparent anesthesia complications

## 2019-03-26 ENCOUNTER — Inpatient Hospital Stay (HOSPITAL_COMMUNITY): Payer: Medicaid Other

## 2019-03-26 LAB — POCT I-STAT 7, (LYTES, BLD GAS, ICA,H+H)
Acid-base deficit: 2 mmol/L (ref 0.0–2.0)
Acid-base deficit: 2 mmol/L (ref 0.0–2.0)
Acid-base deficit: 3 mmol/L — ABNORMAL HIGH (ref 0.0–2.0)
Bicarbonate: 24.6 mmol/L (ref 20.0–28.0)
Bicarbonate: 22.9 mmol/L (ref 20.0–28.0)
Bicarbonate: 21.5 mmol/L (ref 20.0–28.0)
Calcium, Ion: 1.31 mmol/L (ref 1.15–1.40)
Calcium, Ion: 1.23 mmol/L (ref 1.15–1.40)
Calcium, Ion: 1.19 mmol/L (ref 1.15–1.40)
HCT: 32 % — ABNORMAL LOW (ref 36.0–46.0)
HCT: 29 % — ABNORMAL LOW (ref 36.0–46.0)
HCT: 27 % — ABNORMAL LOW (ref 36.0–46.0)
Hemoglobin: 10.9 g/dL — ABNORMAL LOW (ref 12.0–15.0)
Hemoglobin: 9.9 g/dL — ABNORMAL LOW (ref 12.0–15.0)
Hemoglobin: 9.2 g/dL — ABNORMAL LOW (ref 12.0–15.0)
O2 Saturation: 93 %
O2 Saturation: 96 %
O2 Saturation: 95 %
Patient temperature: 36.3
Patient temperature: 37.7
Patient temperature: 37.7
Potassium: 3.7 mmol/L (ref 3.5–5.1)
Potassium: 4.4 mmol/L (ref 3.5–5.1)
Potassium: 3.9 mmol/L (ref 3.5–5.1)
Sodium: 138 mmol/L (ref 135–145)
Sodium: 137 mmol/L (ref 135–145)
Sodium: 138 mmol/L (ref 135–145)
TCO2: 26 mmol/L (ref 22–32)
TCO2: 24 mmol/L (ref 22–32)
TCO2: 23 mmol/L (ref 22–32)
pCO2 arterial: 46.5 mmHg (ref 32.0–48.0)
pCO2 arterial: 40.6 mmHg (ref 32.0–48.0)
pCO2 arterial: 38.6 mmHg (ref 32.0–48.0)
pH, Arterial: 7.328 — ABNORMAL LOW (ref 7.350–7.450)
pH, Arterial: 7.361 (ref 7.350–7.450)
pH, Arterial: 7.358 (ref 7.350–7.450)
pO2, Arterial: 70 mmHg — ABNORMAL LOW (ref 83.0–108.0)
pO2, Arterial: 85 mmHg (ref 83.0–108.0)
pO2, Arterial: 81 mmHg — ABNORMAL LOW (ref 83.0–108.0)

## 2019-03-26 LAB — GLUCOSE, CAPILLARY
Glucose-Capillary: 100 mg/dL — ABNORMAL HIGH (ref 70–99)
Glucose-Capillary: 101 mg/dL — ABNORMAL HIGH (ref 70–99)
Glucose-Capillary: 101 mg/dL — ABNORMAL HIGH (ref 70–99)
Glucose-Capillary: 109 mg/dL — ABNORMAL HIGH (ref 70–99)
Glucose-Capillary: 109 mg/dL — ABNORMAL HIGH (ref 70–99)
Glucose-Capillary: 115 mg/dL — ABNORMAL HIGH (ref 70–99)
Glucose-Capillary: 122 mg/dL — ABNORMAL HIGH (ref 70–99)
Glucose-Capillary: 126 mg/dL — ABNORMAL HIGH (ref 70–99)
Glucose-Capillary: 130 mg/dL — ABNORMAL HIGH (ref 70–99)
Glucose-Capillary: 130 mg/dL — ABNORMAL HIGH (ref 70–99)
Glucose-Capillary: 131 mg/dL — ABNORMAL HIGH (ref 70–99)
Glucose-Capillary: 133 mg/dL — ABNORMAL HIGH (ref 70–99)
Glucose-Capillary: 145 mg/dL — ABNORMAL HIGH (ref 70–99)
Glucose-Capillary: 151 mg/dL — ABNORMAL HIGH (ref 70–99)
Glucose-Capillary: 155 mg/dL — ABNORMAL HIGH (ref 70–99)
Glucose-Capillary: 164 mg/dL — ABNORMAL HIGH (ref 70–99)
Glucose-Capillary: 166 mg/dL — ABNORMAL HIGH (ref 70–99)
Glucose-Capillary: 88 mg/dL (ref 70–99)
Glucose-Capillary: 93 mg/dL (ref 70–99)
Glucose-Capillary: 95 mg/dL (ref 70–99)
Glucose-Capillary: 97 mg/dL (ref 70–99)

## 2019-03-26 LAB — BASIC METABOLIC PANEL
Anion gap: 8 (ref 5–15)
Anion gap: 10 (ref 5–15)
BUN: 9 mg/dL (ref 6–20)
BUN: 10 mg/dL (ref 6–20)
CO2: 23 mmol/L (ref 22–32)
CO2: 23 mmol/L (ref 22–32)
Calcium: 8.5 mg/dL — ABNORMAL LOW (ref 8.9–10.3)
Calcium: 8.4 mg/dL — ABNORMAL LOW (ref 8.9–10.3)
Chloride: 105 mmol/L (ref 98–111)
Chloride: 101 mmol/L (ref 98–111)
Creatinine, Ser: 0.52 mg/dL (ref 0.44–1.00)
Creatinine, Ser: 0.67 mg/dL (ref 0.44–1.00)
GFR calc Af Amer: >60 mL/min (ref >60)
GFR calc Af Amer: >60 mL/min (ref >60)
GFR calc non Af Amer: >60 mL/min (ref >60)
GFR calc non Af Amer: >60 mL/min (ref >60)
Glucose, Bld: 118 mg/dL — ABNORMAL HIGH (ref 70–99)
Glucose, Bld: 162 mg/dL — ABNORMAL HIGH (ref 70–99)
Potassium: 4.2 mmol/L (ref 3.5–5.1)
Potassium: 4.4 mmol/L (ref 3.5–5.1)
Sodium: 136 mmol/L (ref 135–145)
Sodium: 134 mmol/L — ABNORMAL LOW (ref 135–145)

## 2019-03-26 LAB — CBC
HCT: 29.5 % — ABNORMAL LOW (ref 36.0–46.0)
HCT: 28.9 % — ABNORMAL LOW (ref 36.0–46.0)
Hemoglobin: 9.8 g/dL — ABNORMAL LOW (ref 12.0–15.0)
Hemoglobin: 9.6 g/dL — ABNORMAL LOW (ref 12.0–15.0)
MCH: 28.9 pg (ref 26.0–34.0)
MCH: 28.9 pg (ref 26.0–34.0)
MCHC: 33.2 g/dL (ref 30.0–36.0)
MCHC: 33.2 g/dL (ref 30.0–36.0)
MCV: 87 fL (ref 80.0–100.0)
MCV: 87 fL (ref 80.0–100.0)
Platelets: 230 10*3/uL (ref 150–400)
Platelets: 182 10*3/uL (ref 150–400)
RBC: 3.39 MIL/uL — ABNORMAL LOW (ref 3.87–5.11)
RBC: 3.32 MIL/uL — ABNORMAL LOW (ref 3.87–5.11)
RDW: 12.6 % (ref 11.5–15.5)
RDW: 13 % (ref 11.5–15.5)
WBC: 11.6 10*3/uL — ABNORMAL HIGH (ref 4.0–10.5)
WBC: 11.2 10*3/uL — ABNORMAL HIGH (ref 4.0–10.5)
nRBC: 0 % (ref 0.0–0.2)
nRBC: 0 % (ref 0.0–0.2)

## 2019-03-26 LAB — COOXEMETRY PANEL
Carboxyhemoglobin: 1 % (ref 0.5–1.5)
Methemoglobin: 1.1 % (ref 0.0–1.5)
O2 Saturation: 65.8 %
Total hemoglobin: 10.3 g/dL — ABNORMAL LOW (ref 12.0–16.0)

## 2019-03-26 LAB — MAGNESIUM
Magnesium: 2.3 mg/dL (ref 1.7–2.4)
Magnesium: 1.8 mg/dL (ref 1.7–2.4)

## 2019-03-26 MED ORDER — SODIUM CHLORIDE 0.9% FLUSH
10.0000 mL | Freq: Two times a day (BID) | INTRAVENOUS | Status: DC
  Administered 2019-03-26 – 2019-03-30 (×7): 10 mL

## 2019-03-26 MED ORDER — FUROSEMIDE 10 MG/ML IJ SOLN
20.0000 mg | Freq: Two times a day (BID) | INTRAMUSCULAR | Status: AC
  Administered 2019-03-26 (×2): 20 mg via INTRAVENOUS
  Filled 2019-03-26 (×2): qty 2

## 2019-03-26 MED ORDER — INSULIN DETEMIR 100 UNIT/ML ~~LOC~~ SOLN
10.0000 [IU] | Freq: Two times a day (BID) | SUBCUTANEOUS | Status: DC
  Administered 2019-03-26 – 2019-03-30 (×8): 10 [IU] via SUBCUTANEOUS
  Filled 2019-03-26 (×10): qty 0.1

## 2019-03-26 MED ORDER — INSULIN ASPART 100 UNIT/ML ~~LOC~~ SOLN
0.0000 [IU] | SUBCUTANEOUS | Status: DC
  Administered 2019-03-26: 2 [IU] via SUBCUTANEOUS
  Administered 2019-03-26: 4 [IU] via SUBCUTANEOUS
  Administered 2019-03-26 (×2): 2 [IU] via SUBCUTANEOUS
  Administered 2019-03-27 (×2): 4 [IU] via SUBCUTANEOUS
  Administered 2019-03-27: 2 [IU] via SUBCUTANEOUS
  Administered 2019-03-27: 8 [IU] via SUBCUTANEOUS
  Administered 2019-03-28: 4 [IU] via SUBCUTANEOUS
  Administered 2019-03-28 (×2): 2 [IU] via SUBCUTANEOUS
  Administered 2019-03-29: 4 [IU] via SUBCUTANEOUS
  Administered 2019-03-29: 04:00:00 2 [IU] via SUBCUTANEOUS

## 2019-03-26 MED ORDER — ENOXAPARIN SODIUM 40 MG/0.4ML ~~LOC~~ SOLN
40.0000 mg | Freq: Every day | SUBCUTANEOUS | Status: DC
  Administered 2019-03-26 – 2019-03-28 (×3): 40 mg via SUBCUTANEOUS
  Filled 2019-03-26 (×3): qty 0.4

## 2019-03-26 MED ORDER — SODIUM CHLORIDE 0.9% FLUSH: 10.0000 mL | INTRAVENOUS | Status: DC | PRN

## 2019-03-26 NOTE — Progress Notes (Signed)
      [id number]       [place] [address]             [phone]      Up in chair BP 100/62   Pulse 92   Temp 97.9 F (36.6 C) (Oral)   Resp 16   Ht 5\' 2"  (1.575 m)   Wt 85.6 kg   SpO2 95%   BMI 34.52 kg/m  2L Claude 95% sat  Intake/Output Summary (Last 24 hours) at 03/26/2019 1721 Last data filed at 03/26/2019 1700 Gross per 24 hour  Intake 4358.84 ml  Output 3428 ml  Net 930.84 ml   CBG 95-150 PM labs pending  Remo Lipps C. Roxan Hockey, MD [facility] [phone]

## 2019-03-26 NOTE — Plan of Care (Signed)
  Problem: Cardiac: Goal: Will achieve and/or maintain hemodynamic stability Outcome: Progressing   Problem: Clinical Measurements: Goal: Postoperative complications will be avoided or minimized Outcome: Progressing   Problem: Respiratory: Goal: Respiratory status will improve Outcome: Progressing   

## 2019-03-26 NOTE — Progress Notes (Signed)
1 Day Post-Op Procedure(s) (LRB): CORONARY ARTERY BYPASS GRAFTING (CABG) X THREE, ON PUMP, USING LEFT INTERNAL MAMMARY ARTERY AND LEFT GREATER SAPHENOUS VEIN HARVESTED ENDOSCOPICALLY (N/A) TRANSESOPHAGEAL ECHOCARDIOGRAM (TEE) (N/A) Subjective: C/o pain and fingers being numb  Objective: Vital signs in last 24 hours: Temp:  [97.3 F (36.3 C)-100.2 F (37.9 C)] 99.7 F (37.6 C) (11/07 0900) Pulse Rate:  [80-101] 101 (11/07 0900) Cardiac Rhythm: Atrial paced;Normal sinus rhythm (11/07 0800) Resp:  [15-29] 24 (11/07 0900) BP: (86-126)/(50-77) 103/62 (11/07 0900) SpO2:  [91 %-100 %] 97 % (11/07 0900) Arterial Line BP: (100-144)/(48-64) 136/58 (11/07 0900) FiO2 (%):  [40 %-50 %] 40 % (11/07 0245) Weight:  [81.1 kg-85.6 kg] 85.6 kg (11/07 0530)  Hemodynamic parameters for last 24 hours: PAP: (21-44)/(6-22) 44/22 CVP:  [11 mmHg-12 mmHg] 11 mmHg CO:  [4.3 L/min-7.4 L/min] 5.1 L/min CI:  [2.4 L/min/m2-4.1 L/min/m2] 2.8 L/min/m2  Intake/Output from previous day: 11/06 0701 - 11/07 0700 In: 5588 [I.V.:3515.7; Blood:200; NG/GT:60; IV Piggyback:1812.3] Out: 6468 [Urine:2850; Chest Tube:298] Intake/Output this shift: Total I/O In: 197 [I.V.:180.1; IV Piggyback:16.9] Out: 325 [Urine:275; Chest Tube:50]  General appearance: alert, cooperative and no distress Neurologic: numbness hands bilaterally, motor function intact Heart: regular rate and rhythm Lungs: diminished breath sounds bibasilar Abdomen: normal findings: soft, non-tender  Lab Results: Recent Labs    03/25/19 2000 03/26/19 0304 03/26/19 0312 03/26/19 0431  WBC 17.2* 11.6*  --   --   HGB 10.8* 9.8* 9.9* 9.2*  HCT 32.2* 29.5* 29.0* 27.0*  PLT 216 230  --   --    BMET:  Recent Labs    03/25/19 0308  03/26/19 0304 03/26/19 0312 03/26/19 0431  NA 134*   < > 136 137 138  K 4.0   < > 4.2 4.4 3.9  CL 101  --  105  --   --   CO2 24  --  23  --   --   GLUCOSE 224*  --  118*  --   --   BUN 14  --  9  --   --    CREATININE 0.69  --  0.52  --   --   CALCIUM 8.6*  --  8.5*  --   --    < > = values in this interval not displayed.    PT/INR:  Recent Labs    03/25/19 2000  LABPROT 15.8*  INR 1.3*   ABG    Component Value Date/Time   PHART 7.358 03/26/2019 0431   HCO3 21.5 03/26/2019 0431   TCO2 23 03/26/2019 0431   ACIDBASEDEF 3.0 (H) 03/26/2019 0431   O2SAT 65.8 03/26/2019 0455   CBG (last 3)  Recent Labs    03/26/19 0656 03/26/19 0759 03/26/19 0853  GLUCAP 97 93 101*    Assessment/Plan: S/P Procedure(s) (LRB): CORONARY ARTERY BYPASS GRAFTING (CABG) X THREE, ON PUMP, USING LEFT INTERNAL MAMMARY ARTERY AND LEFT GREATER SAPHENOUS VEIN HARVESTED ENDOSCOPICALLY (N/A) TRANSESOPHAGEAL ECHOCARDIOGRAM (TEE) (N/A) -POD # 1 NEURO- c/o bilateral hand/ finger numbness. Motor function intact and symmetrical.  Vascular intact CV- good hemodynamics- wean inotropes RESP- IS for atelectasis RENAL- creatinine and lytes Ok  Lasix 20 IV BID today ENDO- transition from insulin drip to levemir + SSI Anemia secondary to ABL- mild, follow DC chest tubes SCD + enoxaparin for DVT prophylaxis Mobilize   LOS: 3 days    Joan Webb 03/26/2019

## 2019-03-26 NOTE — Plan of Care (Signed)
  Problem: Education: Goal: Knowledge of General Education information will improve Description: Including pain rating scale, medication(s)/side effects and non-pharmacologic comfort measures Outcome: Progressing   Problem: Health Behavior/Discharge Planning: Goal: Ability to manage health-related needs will improve Outcome: Progressing   Problem: Clinical Measurements: Goal: Ability to maintain clinical measurements within normal limits will improve Outcome: Progressing Goal: Will remain free from infection Outcome: Progressing Goal: Diagnostic test results will improve Outcome: Progressing Goal: Respiratory complications will improve Outcome: Progressing Goal: Cardiovascular complication will be avoided Outcome: Progressing   Problem: Activity: Goal: Risk for activity intolerance will decrease Outcome: Progressing   Problem: Nutrition: Goal: Adequate nutrition will be maintained Outcome: Progressing   Problem: Coping: Goal: Level of anxiety will decrease Outcome: Progressing   Problem: Elimination: Goal: Will not experience complications related to bowel motility Outcome: Progressing Goal: Will not experience complications related to urinary retention Outcome: Progressing   Problem: Pain Managment: Goal: General experience of comfort will improve Outcome: Progressing   Problem: Safety: Goal: Ability to remain free from injury will improve Outcome: Progressing   Problem: Skin Integrity: Goal: Risk for impaired skin integrity will decrease Outcome: Progressing   Problem: Education: Goal: Understanding of cardiac disease, CV risk reduction, and recovery process will improve Outcome: Progressing Goal: Understanding of medication regimen will improve Outcome: Progressing Goal: Individualized Educational Video(s) Outcome: Progressing   Problem: Activity: Goal: Ability to tolerate increased activity will improve Outcome: Progressing   Problem: Cardiac: Goal:  Ability to achieve and maintain adequate cardiopulmonary perfusion will improve Outcome: Progressing Goal: Vascular access site(s) Level 0-1 will be maintained Outcome: Progressing   Problem: Health Behavior/Discharge Planning: Goal: Ability to safely manage health-related needs after discharge will improve Outcome: Progressing   Problem: Education: Goal: Will demonstrate proper wound care and an understanding of methods to prevent future damage Outcome: Progressing Goal: Knowledge of disease or condition will improve Outcome: Progressing Goal: Knowledge of the prescribed therapeutic regimen will improve Outcome: Progressing Goal: Individualized Educational Video(s) Outcome: Progressing   Problem: Activity: Goal: Risk for activity intolerance will decrease Outcome: Progressing   Problem: Cardiac: Goal: Will achieve and/or maintain hemodynamic stability Outcome: Progressing   Problem: Clinical Measurements: Goal: Postoperative complications will be avoided or minimized Outcome: Progressing   Problem: Respiratory: Goal: Respiratory status will improve Outcome: Progressing   Problem: Skin Integrity: Goal: Wound healing without signs and symptoms of infection Outcome: Progressing Goal: Risk for impaired skin integrity will decrease Outcome: Progressing   Problem: Urinary Elimination: Goal: Ability to achieve and maintain adequate renal perfusion and functioning will improve Outcome: Progressing   

## 2019-03-26 NOTE — Procedures (Signed)
Extubation Procedure Note  Patient Details:   Name: Joan Webb DOB: Sep 24, 1970 MRN: [id number]   Airway Documentation:    Vent end date: 03/26/19 Vent end time: 0324   Evaluation  O2 sats: stable throughout Complications: No apparent complications Patient did tolerate procedure well. Bilateral Breath Sounds: (slight coarse)   Yes pt able to vocalize.  Pt extubated at this time per Rapid wean protocol. Nif of -20 cm H2O and VC of 0.75L. Pt has strong, adequate cough. Pt was able to breathe around deflated cuff. No stridor noted at this time. IS performed with ok effort of 250-587mLx5 but pt complaining of a lot of pain.    Irineo Axon [facility] 03/26/2019, 3:30 AM

## 2019-03-27 ENCOUNTER — Inpatient Hospital Stay (HOSPITAL_COMMUNITY): Payer: Medicaid Other

## 2019-03-27 LAB — TYPE AND SCREEN
ABO/RH(D): O NEG
Antibody Screen: NEGATIVE
Unit division: 0
Unit division: 0

## 2019-03-27 LAB — BPAM RBC
Blood Product Expiration Date: 202011242359
Blood Product Expiration Date: 202011272359
Unit Type and Rh: 9500
Unit Type and Rh: 9500

## 2019-03-27 LAB — GLUCOSE, CAPILLARY
Glucose-Capillary: 131 mg/dL — ABNORMAL HIGH (ref 70–99)
Glucose-Capillary: 157 mg/dL — ABNORMAL HIGH (ref 70–99)
Glucose-Capillary: 190 mg/dL — ABNORMAL HIGH (ref 70–99)
Glucose-Capillary: 190 mg/dL — ABNORMAL HIGH (ref 70–99)
Glucose-Capillary: 224 mg/dL — ABNORMAL HIGH (ref 70–99)
Glucose-Capillary: 71 mg/dL (ref 70–99)

## 2019-03-27 LAB — BASIC METABOLIC PANEL
Anion gap: 9 (ref 5–15)
BUN: 13 mg/dL (ref 6–20)
CO2: 24 mmol/L (ref 22–32)
Calcium: 8.4 mg/dL — ABNORMAL LOW (ref 8.9–10.3)
Chloride: 100 mmol/L (ref 98–111)
Creatinine, Ser: 0.64 mg/dL (ref 0.44–1.00)
GFR calc Af Amer: >60 mL/min (ref >60)
GFR calc non Af Amer: >60 mL/min (ref >60)
Glucose, Bld: 156 mg/dL — ABNORMAL HIGH (ref 70–99)
Potassium: 4.1 mmol/L (ref 3.5–5.1)
Sodium: 133 mmol/L — ABNORMAL LOW (ref 135–145)

## 2019-03-27 LAB — CBC
HCT: 26.7 % — ABNORMAL LOW (ref 36.0–46.0)
Hemoglobin: 8.9 g/dL — ABNORMAL LOW (ref 12.0–15.0)
MCH: 29.5 pg (ref 26.0–34.0)
MCHC: 33.3 g/dL (ref 30.0–36.0)
MCV: 88.4 fL (ref 80.0–100.0)
Platelets: 168 10*3/uL (ref 150–400)
RBC: 3.02 MIL/uL — ABNORMAL LOW (ref 3.87–5.11)
RDW: 12.9 % (ref 11.5–15.5)
WBC: 10.5 10*3/uL (ref 4.0–10.5)
nRBC: 0 % (ref 0.0–0.2)

## 2019-03-27 LAB — COOXEMETRY PANEL
Carboxyhemoglobin: 1.5 % (ref 0.5–1.5)
Methemoglobin: 1.2 % (ref 0.0–1.5)
O2 Saturation: 60.7 %
Total hemoglobin: 10.1 g/dL — ABNORMAL LOW (ref 12.0–16.0)

## 2019-03-27 MED ORDER — PRAVASTATIN SODIUM 10 MG PO TABS
20.0000 mg | ORAL_TABLET | Freq: Every day | ORAL | Status: DC
  Administered 2019-03-27: 20 mg via ORAL
  Filled 2019-03-27: qty 2

## 2019-03-27 MED ORDER — STROKE: EARLY STAGES OF RECOVERY BOOK
Freq: Once | Status: AC
  Administered 2019-03-27: 17:00:00
  Filled 2019-03-27: qty 1

## 2019-03-27 MED ORDER — GABAPENTIN 300 MG PO CAPS
300.0000 mg | ORAL_CAPSULE | Freq: Two times a day (BID) | ORAL | Status: DC
  Administered 2019-03-27 – 2019-03-30 (×7): 300 mg via ORAL
  Filled 2019-03-27 (×7): qty 1

## 2019-03-27 MED ORDER — DEXTROSE 50 % IV SOLN
INTRAVENOUS | Status: AC
  Filled 2019-03-27: qty 50

## 2019-03-27 MED ORDER — MILRINONE LACTATE IN DEXTROSE 20-5 MG/100ML-% IV SOLN
0.1250 ug/kg/min | INTRAVENOUS | Status: DC
  Administered 2019-03-28: 11:00:00 0.125 ug/kg/min via INTRAVENOUS
  Filled 2019-03-27: qty 100

## 2019-03-27 MED ORDER — IOHEXOL 350 MG/ML SOLN
75.0000 mL | Freq: Once | INTRAVENOUS | Status: AC | PRN
  Administered 2019-03-27: 12:00:00 75 mL via INTRAVENOUS

## 2019-03-27 MED ORDER — GLIPIZIDE 10 MG PO TABS
10.0000 mg | ORAL_TABLET | Freq: Two times a day (BID) | ORAL | Status: DC
  Administered 2019-03-27 – 2019-03-30 (×6): 10 mg via ORAL
  Filled 2019-03-27 (×8): qty 1

## 2019-03-27 NOTE — Progress Notes (Signed)
2 Days Post-Op Procedure(s) (LRB): CORONARY ARTERY BYPASS GRAFTING (CABG) X THREE, ON PUMP, USING LEFT INTERNAL MAMMARY ARTERY AND LEFT GREATER SAPHENOUS VEIN HARVESTED ENDOSCOPICALLY (N/A) TRANSESOPHAGEAL ECHOCARDIOGRAM (TEE) (N/A) Subjective: C/o right arm not feeling right. Also some numbness left leg  Objective: Vital signs in last 24 hours: Temp:  [97.8 F (36.6 C)-100 F (37.8 C)] 97.8 F (36.6 C) (11/08 0700) Pulse Rate:  [85-115] 94 (11/08 0600) Cardiac Rhythm: Normal sinus rhythm (11/08 0400) Resp:  [12-30] 18 (11/08 0000) BP: (80-115)/(50-69) 112/56 (11/08 0600) SpO2:  [91 %-99 %] 96 % (11/08 0600) Arterial Line BP: (103-261)/(50-251) 103/54 (11/07 1415) Weight:  [83.3 kg] 83.3 kg (11/08 0500)  Hemodynamic parameters for last 24 hours: PAP: (32-33)/(16-19) 32/19  Intake/Output from previous day: 11/07 0701 - 11/08 0700 In: 1118.6 [I.V.:918.8; IV Piggyback:199.9] Out: 2110 [Urine:2000; Chest Tube:110] Intake/Output this shift: No intake/output data recorded.  General appearance: alert, cooperative and no distress Neurologic: RUE weakness Heart: regular rate and rhythm Lungs: diminished breath sounds bibasilar Abdomen: normal findings: soft, non-tender  Lab Results: Recent Labs    03/26/19 1734 03/27/19 0250  WBC 11.2* 10.5  HGB 9.6* 8.9*  HCT 28.9* 26.7*  PLT 182 168   BMET:  Recent Labs    03/26/19 1719 03/27/19 0250  NA 134* 133*  K 4.4 4.1  CL 101 100  CO2 23 24  GLUCOSE 162* 156*  BUN 10 13  CREATININE 0.67 0.64  CALCIUM 8.4* 8.4*    PT/INR:  Recent Labs    03/25/19 2000  LABPROT 15.8*  INR 1.3*   ABG    Component Value Date/Time   PHART 7.358 03/26/2019 0431   HCO3 21.5 03/26/2019 0431   TCO2 23 03/26/2019 0431   ACIDBASEDEF 3.0 (H) 03/26/2019 0431   O2SAT 60.7 03/27/2019 0404   CBG (last 3)  Recent Labs    03/26/19 2343 03/27/19 0330 03/27/19 0809  GLUCAP 155* 157* 190*    Assessment/Plan: S/P Procedure(s)  (LRB): CORONARY ARTERY BYPASS GRAFTING (CABG) X THREE, ON PUMP, USING LEFT INTERNAL MAMMARY ARTERY AND LEFT GREATER SAPHENOUS VEIN HARVESTED ENDOSCOPICALLY (N/A) TRANSESOPHAGEAL ECHOCARDIOGRAM (TEE) (N/A) - NEURO- she has weakness in RUE, concerned she may have had a CVA   Will get stat head CT  Consult Neuro, PT,OT CV- stable in SR. Co-ox 60 on milrinone 0.15 mcg/kg/min RESP- continue IS RENAL- creatinine and lytes OK ENDO_ CBG elevated, restart glipizide  Continue levemir + SSI SCD + enoxaparin    LOS: 4 days    Joan Webb 03/27/2019

## 2019-03-27 NOTE — Progress Notes (Signed)
Staff tried to get patient up to walk this morning but patient had difficulty standing stating her leg felt numb and she could not walk. Patient however transferred to chair.

## 2019-03-27 NOTE — Evaluation (Signed)
Physical Therapy Evaluation Patient Details Name: Joan Webb MRN: [id number] DOB: 1970-10-07 Today's Date: 03/27/2019   History of Present Illness  Pt is a 48 yo female with PMH of DM, tobacco abuse who came to ED for chest pain on 11/4. Pt found to have a STEMI and underwent a CABGx2. Pt was complaining of R sided weakness post surgery. pt also found to have had a CVA per Dr. Rory Percy.  Clinical Impression  Pt admitted with above. Pt was indep PTA but now with sternal precautions, requiring 4Lo2 via [place], and has R Hemiparesis, UE worse than LE. Pt requiring modA for bed mobility and transfer to chair. Pt will require assistx2 for ambulation. Recommend CIR at this time for maximal functional recovery. Pt states she is going to live with her kids once she can go home and that someone will be there all the time. Acute PT to cont to follow and progress mobility.     Follow Up Recommendations CIR    Equipment Recommendations  (TBD at next venue)    Recommendations for Other Services Rehab consult     Precautions / Restrictions Precautions Precautions: Sternal Precaution Booklet Issued: No Precaution Comments: pt educated on sternal precautions, also noted to have R hemiparesis Restrictions Weight Bearing Restrictions: Yes Other Position/Activity Restrictions: sternal precautions      Mobility  Bed Mobility Overal bed mobility: Needs Assistance Bed Mobility: Rolling;Sidelying to Sit Rolling: Min assist Sidelying to sit: Mod assist       General bed mobility comments: modA for trunk elevation due to inability to use R UE to push self up  Transfers Overall transfer level: Needs assistance Equipment used: 1 person hand held assist Transfers: Sit to/from [facility] Sit to Stand: Mod assist Stand pivot transfers: Mod assist       General transfer comment: modA to power up, unable to use R UE, pt requiring minA to advance R LE with noted weakness, pt also with  c/o SOB , SPO2 at 90% on 4Lo2 via [place]  Ambulation/Gait             General Gait Details: limited to std pvt transfer as pt unable to safely ambulate alone due to lines, O2 and R sided weakness  Stairs            Wheelchair Mobility    Modified Rankin (Stroke Patients Only) Modified Rankin (Stroke Patients Only) Pre-Morbid Rankin Score: No symptoms Modified Rankin: Moderately severe disability     Balance Overall balance assessment: Needs assistance Sitting-balance support: Feet unsupported;No upper extremity supported Sitting balance-Leahy Scale: Fair     Standing balance support: Single extremity supported Standing balance-Leahy Scale: Poor Standing balance comment: dependent on physical assist                             Pertinent Vitals/Pain Pain Assessment: Faces Faces Pain Scale: Hurts little more Pain Location: chest, grimacing with mobility Pain Descriptors / Indicators: Grimacing;Guarding Pain Intervention(s): Monitored during session(used heart pillow)    Home Living Family/patient expects to be discharged to:: Private residence Living Arrangements: Alone(but plan to live with children) Available Help at Discharge: Family;Available PRN/intermittently Type of Home: House Home Access: Stairs to enter Entrance Stairs-Rails: Can reach both Entrance Stairs-Number of Steps: 2 Home Layout: One level        Prior Function Level of Independence: Independent               Hand  Dominance   Dominant Hand: Right    Extremity/Trunk Assessment   Upper Extremity Assessment Upper Extremity Assessment: RUE deficits/detail RUE Deficits / Details: minimal grip, 1/5 at shoulder and elbow, 0/5 at wrist RUE Coordination: decreased fine motor;decreased gross motor    Lower Extremity Assessment Lower Extremity Assessment: RLE deficits/detail RLE Deficits / Details: grossly 4-/5 RLE Coordination: decreased gross motor;decreased fine  motor(difficulty sequencing during std pvt transfer)    Cervical / Trunk Assessment Cervical / Trunk Assessment: Other exceptions Cervical / Trunk Exceptions: chest/sternal incision  Communication   Communication: No difficulties  Cognition Arousal/Alertness: Awake/alert Behavior During Therapy: WFL for tasks assessed/performed Overall Cognitive Status: Within Functional Limits for tasks assessed                                 General Comments: pt very frustrated regarding R UE impaired function as she is R handed, however she is trying eveyrthing she can to use it.       General Comments General comments (skin integrity, edema, etc.): pt with chest incision covered by dressing    Exercises     Assessment/Plan    PT Assessment Patient needs continued PT services  PT Problem List Decreased strength;Decreased range of motion;Decreased activity tolerance;Decreased balance;Decreased mobility;Decreased coordination;Decreased cognition;Decreased knowledge of use of DME;Decreased safety awareness;Decreased knowledge of precautions       PT Treatment Interventions DME instruction;Gait training;Stair training;Functional mobility training;Therapeutic activities;Therapeutic exercise;Balance training;Neuromuscular re-education;Cognitive remediation    PT Goals (Current goals can be found in the Care Plan section)  Acute Rehab PT Goals PT Goal Formulation: With patient Time For Goal Achievement: 04/10/19 Potential to Achieve Goals: Good    Frequency Min 4X/week   Barriers to discharge        Co-evaluation               AM-PAC PT "6 Clicks" Mobility  Outcome Measure Help needed turning from your back to your side while in a flat bed without using bedrails?: A Lot Help needed moving from lying on your back to sitting on the side of a flat bed without using bedrails?: A Lot Help needed moving to and from a bed to a chair (including a wheelchair)?: A Lot Help  needed standing up from a chair using your arms (e.g., wheelchair or bedside chair)?: A Lot Help needed to walk in hospital room?: A Lot Help needed climbing 3-5 steps with a railing? : Total 6 Click Score: 11    End of Session Equipment Utilized During Treatment: Gait belt;Oxygen Activity Tolerance: Patient tolerated treatment well Patient left: in chair;with call bell/phone within reach;with chair alarm set Nurse Communication: Mobility status PT Visit Diagnosis: Unsteadiness on feet (R26.81);Muscle weakness (generalized) (M62.81);Difficulty in walking, not elsewhere classified (R26.2);Hemiplegia and hemiparesis Hemiplegia - Right/Left: Right Hemiplegia - dominant/non-dominant: Dominant    Time: 0258-5277 PT Time Calculation (min) (ACUTE ONLY): 29 min   Charges:   PT Evaluation $PT Eval Moderate Complexity: 1 Mod PT Treatments $Therapeutic Activity: 8-22 mins        Lewis Shock, PT, DPT Acute Rehabilitation Services Pager #: [phone] Office #: [phone]   Joan Webb 03/27/2019, 3:21 PM

## 2019-03-27 NOTE — Consult Note (Addendum)
Neurology Consultation  Reason for Consult: Right arm weakness Referring Physician: Dr. [name]  CC: Right arm weakness  History is obtained from: Patient  HPI: Joan Webb is a 48 y.o. female past medical history coronary artery disease status post coronary artery bypass graft postop day 2, anxiety, diabetes, hyperlipidemia, tobacco abuse, admitted in the cardiac ICU after coronary artery bypass grafting done 2 days ago started complaining of right arm weakness and numbness in both lower extremities. She says that her right arm has been feeling weak now for the past couple of days.  She could not give me the exact time and she is still not oriented to time.  She said that this had started to happen 2 to 3 days ago when she had numbness and weakness of her right arm. She reports having some neuropathy at baseline but not weakness and inability to close her fist and lift her arm. She does not report any headaches or tingling numbness weakness in any other parts of the body.  She reports extreme pain in her chest.  Also reports of chronic back pain.  I called the son over the phone-Ryan Boykin-he reports that he came last night and saw his mother his right side all appeared to be as if she has had a stroke.  Her leg symptoms have improved but her arm remains weak  LKW: Unclear-2 to 3 days ago tpa given?: no, outside the window Premorbid modified Rankin scale (mRS): 1  ROS: ROS was performed and is negative except as noted in the HPI.  Past Medical History:  Diagnosis Date  . Anxiety   . Coronary artery disease   . Diabetes (HCC)   . Hyperlipidemia   . Myocardial infarction (HCC)   . Tobacco abuse     Family History  Problem Relation Age of Onset  . Heart attack Father    Social History:   reports that she has been smoking cigarettes. She has never used smokeless tobacco. She reports previous alcohol use. She reports previous drug use.  Medications  Current  Facility-Administered Medications:  .  0.45 % sodium chloride infusion, , Intravenous, Continuous PRN, Leary Roca, PA-C, Stopped at 03/26/19 1326 .  0.9 %  sodium chloride infusion, 250 mL, Intravenous, Continuous, Roddenberry, Myron G, PA-C .  0.9 %  sodium chloride infusion, , Intravenous, Continuous, Roddenberry, Myron G, PA-C, Last Rate: 10 mL/hr at 03/25/19 1930 .  acetaminophen (TYLENOL) tablet 1,000 mg, 1,000 mg, Oral, Q6H, 1,000 mg at 03/27/19 0526 **OR** acetaminophen (TYLENOL) 160 MG/5ML solution 1,000 mg, 1,000 mg, Per Tube, Q6H, Roddenberry, Myron G, PA-C, 1,000 mg at 03/25/19 2346 .  aspirin EC tablet 325 mg, 325 mg, Oral, Daily, 325 mg at 03/27/19 1002 **OR** aspirin chewable tablet 324 mg, 324 mg, Per Tube, Daily, Roddenberry, Myron G, PA-C .  bisacodyl (DULCOLAX) EC tablet 10 mg, 10 mg, Oral, Daily, 10 mg at 03/27/19 1002 **OR** bisacodyl (DULCOLAX) suppository 10 mg, 10 mg, Rectal, Daily, Roddenberry, Myron G, PA-C .  Chlorhexidine Gluconate Cloth 2 % PADS 6 each, 6 each, Topical, Daily, Donata Clay, Theron Arista, MD, 6 each at 03/27/19 1026 .  docusate sodium (COLACE) capsule 200 mg, 200 mg, Oral, Daily, Roddenberry, Myron G, PA-C, 200 mg at 03/27/19 1002 .  DOPamine (INTROPIN) 800 mg in dextrose 5 % 250 mL (3.2 mg/mL) infusion, 0-10 mcg/kg/min, Intravenous, Titrated, Roddenberry, Myron G, PA-C, Stopped at 03/26/19 1418 .  enoxaparin (LOVENOX) injection 40 mg, 40 mg, Subcutaneous, QHS, Loreli Slot, MD,  40 mg at 03/26/19 2109 .  gabapentin (NEURONTIN) capsule 300 mg, 300 mg, Oral, BID, Loreli Slot, MD, 300 mg at 03/27/19 1002 .  glipiZIDE (GLUCOTROL) tablet 10 mg, 10 mg, Oral, BID AC, Loreli Slot, MD .  influenza vac split quadrivalent PF (FLUARIX) injection 0.5 mL, 0.5 mL, Intramuscular, Prior to discharge, Kathleene Hazel, MD .  CBG monitoring, , , Q4H **AND** insulin aspart (novoLOG) injection 0-24 Units, 0-24 Units, Subcutaneous, Q4H,  Loreli Slot, MD, 4 Units at 03/27/19 0845 .  insulin detemir (LEVEMIR) injection 10 Units, 10 Units, Subcutaneous, BID, Loreli Slot, MD, 10 Units at 03/27/19 1026 .  lactated ringers infusion, , Intravenous, Continuous, Roddenberry, Myron G, PA-C, Last Rate: 20 mL/hr at 03/27/19 0700 .  metoprolol tartrate (LOPRESSOR) tablet 12.5 mg, 12.5 mg, Oral, BID, 12.5 mg at 03/27/19 1002 **OR** metoprolol tartrate (LOPRESSOR) 25 mg/10 mL oral suspension 12.5 mg, 12.5 mg, Per Tube, BID, Roddenberry, Myron G, PA-C .  metoprolol tartrate (LOPRESSOR) injection 2.5-5 mg, 2.5-5 mg, Intravenous, Q2H PRN, Roddenberry, Myron G, PA-C .  midazolam (VERSED) injection 2 mg, 2 mg, Intravenous, Q1H PRN, Roddenberry, Myron G, PA-C, 2 mg at 03/25/19 2228 .  milrinone (PRIMACOR) 20 MG/100 ML (0.2 mg/mL) infusion, 0.125 mcg/kg/min (Adjusted), Intravenous, Continuous, Loreli Slot, MD, Last Rate: 2.38 mL/hr at 03/27/19 1027, 0.125 mcg/kg/min at 03/27/19 1027 .  morphine 2 MG/ML injection 1-4 mg, 1-4 mg, Intravenous, Q1H PRN, Roddenberry, Myron G, PA-C, 2 mg at 03/26/19 1448 .  nitroGLYCERIN 50 mg in dextrose 5 % 250 mL (0.2 mg/mL) infusion, 0-100 mcg/min, Intravenous, Titrated, Roddenberry, Myron G, PA-C .  norepinephrine (LEVOPHED) 4mg  in premix infusion, 0-10 mcg/min, Intravenous, Titrated, Roddenberry, Myron G, PA-C, Stopped at 03/26/19 1257 .  ondansetron (ZOFRAN) injection 4 mg, 4 mg, Intravenous, Q6H PRN, Roddenberry, Myron G, PA-C .  oxyCODONE (Oxy IR/ROXICODONE) immediate release tablet 5-10 mg, 5-10 mg, Oral, Q3H PRN, Roddenberry, Myron G, PA-C, 10 mg at 03/27/19 0846 .  pantoprazole (PROTONIX) EC tablet 40 mg, 40 mg, Oral, Daily, Roddenberry, Myron G, PA-C, 40 mg at 03/27/19 1002 .  phenylephrine (NEOSYNEPHRINE) 20-0.9 MG/250ML-% infusion, 0-100 mcg/min, Intravenous, Titrated, Roddenberry, Myron G, PA-C, Stopped at 03/26/19 1208 .  pneumococcal 23 valent vaccine (PNU-IMMUNE) injection  0.5 mL, 0.5 mL, Intramuscular, Prior to discharge, Kathleene Hazel, MD .  pravastatin (PRAVACHOL) tablet 20 mg, 20 mg, Oral, q1800, Loreli Slot, MD .  sodium chloride flush (NS) 0.9 % injection 10-40 mL, 10-40 mL, Intracatheter, Q12H, Donata Clay, Theron Arista, MD, 10 mL at 03/27/19 1031 .  sodium chloride flush (NS) 0.9 % injection 10-40 mL, 10-40 mL, Intracatheter, PRN, Donata Clay, Peter, MD .  sodium chloride flush (NS) 0.9 % injection 3 mL, 3 mL, Intravenous, Q12H, Roddenberry, Myron G, PA-C, 3 mL at 03/27/19 1031 .  sodium chloride flush (NS) 0.9 % injection 3 mL, 3 mL, Intravenous, PRN, Roddenberry, Myron G, PA-C .  traMADol (ULTRAM) tablet 50-100 mg, 50-100 mg, Oral, Q4H PRN, Roddenberry, Myron G, PA-C, 50 mg at 03/27/19 0511   Exam: Current vital signs: BP 112/72   Pulse 95   Temp 97.8 F (36.6 C) (Oral)   Resp 18   Ht 5\' 2"  (1.575 m)   Wt 83.3 kg   SpO2 96%   BMI 33.59 kg/m  Vital signs in last 24 hours: Temp:  [97.8 F (36.6 C)-100 F (37.8 C)] 97.8 F (36.6 C) (11/08 0700) Pulse Rate:  [85-115] 95 (11/08 1002) Resp:  [  12-30] 18 (11/08 0000) BP: (80-115)/(50-72) 112/72 (11/08 1002) SpO2:  [91 %-99 %] 96 % (11/08 0600) Arterial Line BP: (103-261)/(51-251) 103/54 (11/07 1415) Weight:  [83.3 kg] 83.3 kg (11/08 0500) General: Sleeping, awakens to voice, no apparent distress, sleeping on her right side due to back discomfort. HEENT: Cephalic atraumatic Chest: Midline incision and dressing. Abdomen: Nondistended nontender Extremities warm well perfused Neurological exam Mental status/speech/language: She awakens to voice, is alert and oriented to self and place.  Not oriented to time. Her speech is clear. She has reduced attention concentration Naming comprehension fluency intact. Cranial nerves: Pupil equal round react light, extraocular movements, visual field full, facial sensation intact, face symmetric, auditory daily intact, palate and tongue midline.  Motor exam: She has 3/5 strength in the right shoulder.  She has 1/5 on the right elbow extensors and flexors.  She has no movement on her right wrist and fingers.  Her right leg is 4/5.  Her left upper and lower extremity are 5/5. Sensory exam: Decreased sensation to right arm when compared to left. Coordination: Intact finger-nose-finger on the left.  Could not perform on the right. Gait testing was deferred at this time. NIHSS 1a Level of Conscious.: 0 1b LOC Questions: 1 1c LOC Commands: 0 2 Best Gaze: 0 3 Visual: 0 4 Facial Palsy: 0 5a Motor Arm - left: 0 5b Motor Arm - Right: 3 6a Motor Leg - Left: 0 6b Motor Leg - Right: 1 7 Limb Ataxia: 0 8 Sensory: 1 9 Best Language: 0 10 Dysarthria: 0 11 Extinct. and Inatten.: 0 TOTAL: 6  Labs I have reviewed labs in epic and the results pertinent to this consultation are: CBC    Component Value Date/Time   WBC 10.5 03/27/2019 0250   RBC 3.02 (L) 03/27/2019 0250   HGB 8.9 (L) 03/27/2019 0250   HCT 26.7 (L) 03/27/2019 0250   PLT 168 03/27/2019 0250   MCV 88.4 03/27/2019 0250   MCH 29.5 03/27/2019 0250   MCHC 33.3 03/27/2019 0250   RDW 12.9 03/27/2019 0250    CMP     Component Value Date/Time   NA 133 (L) 03/27/2019 0250   K 4.1 03/27/2019 0250   CL 100 03/27/2019 0250   CO2 24 03/27/2019 0250   GLUCOSE 156 (H) 03/27/2019 0250   BUN 13 03/27/2019 0250   CREATININE 0.64 03/27/2019 0250   CALCIUM 8.4 (L) 03/27/2019 0250   PROT 6.2 (L) 03/23/2019 1136   ALBUMIN 3.5 03/23/2019 1136   AST 33 03/23/2019 1136   ALT 18 03/23/2019 1136   ALKPHOS 80 03/23/2019 1136   BILITOT 0.4 03/23/2019 1136   GFRNONAA >60 03/27/2019 0250   GFRAA >60 03/27/2019 0250   Lipid Panel     Component Value Date/Time   CHOL 276 (H) 03/23/2019 0753   TRIG 200 (H) 03/23/2019 0753   HDL 35 (L) 03/23/2019 0753   CHOLHDL 7.9 03/23/2019 0753   VLDL 40 03/23/2019 0753   LDLCALC 201 (H) 03/23/2019 0753     Imaging I have ordered a CT angiogram  head and neck.  2D echo 03/23/2019 IMPRESSIONS  1. Left ventricular ejection fraction, by visual estimation, is 60 to 65%. The left ventricle has normal function. There is no left ventricular hypertrophy.  2. Left ventricular diastolic parameters are indeterminate.  3. Global right ventricle has normal systolic function.The right ventricular size is normal. No increase in right ventricular wall thickness.  4. Left atrial size was normal.  5. Right atrial size  was normal.  6. The mitral valve is normal in structure. No evidence of mitral valve regurgitation. No evidence of mitral stenosis.  7. The tricuspid valve is normal in structure. Tricuspid valve regurgitation is not demonstrated.  8. The aortic valve is tricuspid. Aortic valve regurgitation is not visualized. Mild aortic valve sclerosis without stenosis.  9. The pulmonic valve was normal in structure. Pulmonic valve regurgitation is not visualized. 10. TR signal is inadequate for assessing pulmonary artery systolic pressure. 11. The inferior vena cava is normal in size with <50% respiratory variability, suggesting right atrial pressure of 8 mmHg.  Assessment: 48 year old man past history of coronary artery disease, diabetes, hyperlipidemia, tobacco abuse admitted in the cardiac ICU postop day 2 after coronary artery bypass graft complains of 2 to 3 days worth of right arm numbness and bilateral leg numbness.  Leg numbness is improved with the right arm remains numb and weak. On examination, she has right upper extremity severe paresis and decreased sensation on the right. Evaluate for acute ischemic stroke given recent surgery and cerebrovascular risk factors.  Impression: Evaluate for stroke  Recommendations: Stat CT head Stat CTA head and neck Further recs following imaging.  -- Amie Portland, MD [facility] Pager: [phone] If 7pm to 7am, please call on call as listed on AMION.  Addendum CT head with a  subacute-looking hypodensity right adjacent to the left lateral ventricle. No emergent LVO. CT finding is likely the stroke which is causing her right-sided hemiparesis.  No TPA-outside the window. No endovascular thrombectomy-no evidence of LVO on exam, no emergent LVO on imaging.    Updated recommendations: -Telemetry monitoring -Allow for permissive hypertension for the first 24-48h - only treat PRN if SBP >220 mmHg. Blood pressures can be gradually normalized to SBP<140 upon discharge. -MRI brain without contrast if able to with sternal wires or when able to. -HgbA1c, fasting lipid panel -Frequent neuro checks -Prophylactic therapy-Antiplatelet med: Aspirin - dose 325mg  PO or 300mg  PR -Atorvastatin 80 mg PO daily -Risk factor modification -I discussed the importance of exercise as well as smoking/alcohol/illicit drug use cessation. -PT consult, OT consult, Speech consult  Plan relayed to Dr. Roxan Hockey over the phone  Please page stroke NP/PA/MD (listed on AMION)  from 8am-4 pm as this patient will be followed by the stroke team at this point.   -- Amie Portland, MD [facility] Pager: [phone] If 7pm to 7am, please call on call as listed on AMION.

## 2019-03-27 NOTE — Progress Notes (Signed)
      [address]       [place] [address]             [phone]      POD # 2 CABG  Alert, still with R hemiparesis CT showed a stroke  BP 118/65   Pulse 94   Temp 98.4 F (36.9 C) (Oral)   Resp 18   Ht 5\' 2"  (1.575 m)   Wt 83.3 kg   SpO2 100%   BMI 33.59 kg/m    Intake/Output Summary (Last 24 hours) at 03/27/2019 1831 Last data filed at 03/27/2019 1700 Gross per 24 hour  Intake 1929.57 ml  Output 1115 ml  Net 814.57 ml    PT/OT/Speech  Revonda Standard. Roxan Hockey, MD [facility] [phone]

## 2019-03-27 NOTE — Evaluation (Signed)
Speech Language Pathology Evaluation Patient Details Name: Joan Webb MRN: [id number] DOB: [birth year] Today's Date: 03/27/2019 Time: 4270-6237 SLP Time Calculation (min) (ACUTE ONLY): 16 min  Problem List:  Patient Active Problem List   Diagnosis Date Noted  . 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 Acute ST elevation myocardial infarction (STEMI) of inferior wall (HCC) 03/23/2019  . ST elevation myocardial infarction (STEMI) of inferior wall [facility])    Past Medical History:  Past Medical History:  Diagnosis Date  . Anxiety   . Coronary artery disease   . Diabetes (HCC)   . Hyperlipidemia   . Myocardial infarction (HCC)   . Tobacco abuse    Past Surgical History:  Past Surgical History:  Procedure Laterality Date  . CARDIAC CATHETERIZATION    . LEFT HEART CATH AND CORONARY ANGIOGRAPHY N/A 03/23/2019   Procedure: LEFT HEART CATH AND CORONARY ANGIOGRAPHY;  Surgeon: Kathleene Hazel, MD;  Location: [facility] INVASIVE CV LAB;  Service: Cardiovascular;  Laterality: N/A;  . None     HPI:  Joan Webb is a 48 y.o. female past medical history coronary artery disease status post coronary artery bypass graft postop day 2, anxiety, diabetes, hyperlipidemia, tobacco abuse, admitted in the cardiac ICU after coronary artery bypass grafting done 2 days ago started complaining of right arm weakness and numbness in both lower extremities.  Head CT revealed "Small focus of hypodensity within the left centrum semiovale/corona radiata suspicious for acute/subacute infarct."    Assessment / Plan / Recommendation Clinical Impression  Pt was seen for a cognitive-linguistic evaluation in the setting of a possible acute/subacute infarct.  Pt reported that she lives alone and that she is responsible for all ADLs and IADLs including medication and money management at baseline.  She additionally stated that she works full time as a Medical sales representative delivery person.  Pt presents with a cognitive-linguistic impairment c/b deficits in  short-term memory, executive functioning, and thought organization.  Pt additionally exhibited a hoarse vocal quality following extubation and she presented with delayed processing.  No dysarthria or expressive/receptive language deficits were observed. No family or friends were present to determine cognitive-linguistic baseline; however suspect that this is an acute change in cognitive-linguistic abilities.  Recommend additional ST (inpatient rehab) and assistance with IADLs at time of discharge.  SLP will follow up for cognitive-linguistic treatment per POC.      SLP Assessment  SLP Recommendation/Assessment: Patient needs continued Speech Lanaguage Pathology Services SLP Visit Diagnosis: Cognitive communication deficit (R41.841)    Follow Up Recommendations  Inpatient Rehab    Frequency and Duration min 2x/week  2 weeks      SLP Evaluation Cognition  Overall Cognitive Status: Impaired/Different from baseline Arousal/Alertness: Awake/alert Orientation Level: Oriented X4 Attention: Sustained Sustained Attention: Appears intact Memory: Impaired Memory Impairment: Retrieval deficit Immediate Memory Recall: Sock;Blue;Bed Memory Recall Sock: Not able to recall Memory Recall Blue: Not able to recall Memory Recall Bed: Not able to recall Awareness: Appears intact Problem Solving: Impaired Problem Solving Impairment: Verbal complex Executive Function: Landscape architect: Impaired Organizing Impairment: Verbal complex Safety/Judgment: Appears intact       Comprehension  Auditory Comprehension Overall Auditory Comprehension: Appears within functional limits for tasks assessed    Expression Expression Primary Mode of Expression: Verbal Verbal Expression Overall Verbal Expression: Appears within functional limits for tasks assessed Written Expression Dominant Hand: Right Written Expression: Not tested   Oral / Motor  Motor Speech Overall Motor Speech: Appears within  functional limits for tasks assessed   GO  Joan Webb., M.S., [place] Acute Rehabilitation Services Office: [phone]  [place] 03/27/2019, 2:47 PM

## 2019-03-28 ENCOUNTER — Encounter (HOSPITAL_COMMUNITY): Payer: Self-pay | Admitting: Cardiothoracic Surgery

## 2019-03-28 ENCOUNTER — Inpatient Hospital Stay: Payer: Self-pay

## 2019-03-28 DIAGNOSIS — I6300 Cerebral infarction due to thrombosis of unspecified precerebral artery: Secondary | ICD-10-CM

## 2019-03-28 LAB — CBC
HCT: 25.8 % — ABNORMAL LOW (ref 36.0–46.0)
Hemoglobin: 8.5 g/dL — ABNORMAL LOW (ref 12.0–15.0)
MCH: 29.2 pg (ref 26.0–34.0)
MCHC: 32.9 g/dL (ref 30.0–36.0)
MCV: 88.7 fL (ref 80.0–100.0)
Platelets: 216 10*3/uL (ref 150–400)
RBC: 2.91 MIL/uL — ABNORMAL LOW (ref 3.87–5.11)
RDW: 13 % (ref 11.5–15.5)
WBC: 10.4 10*3/uL (ref 4.0–10.5)
nRBC: 0 % (ref 0.0–0.2)

## 2019-03-28 LAB — LIPID PANEL
Cholesterol: 112 mg/dL (ref 0–200)
HDL: 21 mg/dL — ABNORMAL LOW (ref >40)
LDL Cholesterol: 68 mg/dL (ref 0–99)
Total CHOL/HDL Ratio: 5.3 RATIO
Triglycerides: 114 mg/dL (ref <150)
VLDL: 23 mg/dL (ref 0–40)

## 2019-03-28 LAB — GLUCOSE, CAPILLARY
Glucose-Capillary: 116 mg/dL — ABNORMAL HIGH (ref 70–99)
Glucose-Capillary: 121 mg/dL — ABNORMAL HIGH (ref 70–99)
Glucose-Capillary: 125 mg/dL — ABNORMAL HIGH (ref 70–99)
Glucose-Capillary: 150 mg/dL — ABNORMAL HIGH (ref 70–99)
Glucose-Capillary: 172 mg/dL — ABNORMAL HIGH (ref 70–99)
Glucose-Capillary: 69 mg/dL — ABNORMAL LOW (ref 70–99)
Glucose-Capillary: 88 mg/dL (ref 70–99)
Glucose-Capillary: 98 mg/dL (ref 70–99)

## 2019-03-28 LAB — BASIC METABOLIC PANEL
Anion gap: 9 (ref 5–15)
BUN: 14 mg/dL (ref 6–20)
CO2: 26 mmol/L (ref 22–32)
Calcium: 8.5 mg/dL — ABNORMAL LOW (ref 8.9–10.3)
Chloride: 100 mmol/L (ref 98–111)
Creatinine, Ser: 0.56 mg/dL (ref 0.44–1.00)
GFR calc Af Amer: >60 mL/min (ref >60)
GFR calc non Af Amer: >60 mL/min (ref >60)
Glucose, Bld: 115 mg/dL — ABNORMAL HIGH (ref 70–99)
Potassium: 4.1 mmol/L (ref 3.5–5.1)
Sodium: 135 mmol/L (ref 135–145)

## 2019-03-28 LAB — ECHO INTRAOPERATIVE TEE
Height: 62 in
Weight: 2860.69 oz

## 2019-03-28 LAB — COOXEMETRY PANEL
Carboxyhemoglobin: 0.9 % (ref 0.5–1.5)
Carboxyhemoglobin: 1.2 % (ref 0.5–1.5)
Methemoglobin: 0.9 % (ref 0.0–1.5)
Methemoglobin: 0.8 % (ref 0.0–1.5)
O2 Saturation: 57.2 %
O2 Saturation: 70.2 %
Total hemoglobin: 15.1 g/dL (ref 12.0–16.0)
Total hemoglobin: 9 g/dL — ABNORMAL LOW (ref 12.0–16.0)

## 2019-03-28 LAB — HEMOGLOBIN A1C
Hgb A1c MFr Bld: 7.9 % — ABNORMAL HIGH (ref 4.8–5.6)
Mean Plasma Glucose: 180.03 mg/dL

## 2019-03-28 MED ORDER — MORPHINE SULFATE (PF) 2 MG/ML IV SOLN: 1.0000 mg | INTRAVENOUS | Status: DC | PRN

## 2019-03-28 MED ORDER — NICOTINE 14 MG/24HR TD PT24
14.0000 mg | MEDICATED_PATCH | Freq: Every day | TRANSDERMAL | Status: DC
  Administered 2019-03-28 – 2019-03-30 (×3): 14 mg via TRANSDERMAL
  Filled 2019-03-28 (×3): qty 1

## 2019-03-28 MED ORDER — FUROSEMIDE 10 MG/ML IJ SOLN
40.0000 mg | Freq: Two times a day (BID) | INTRAMUSCULAR | Status: DC
  Administered 2019-03-28 – 2019-03-29 (×4): 40 mg via INTRAVENOUS
  Filled 2019-03-28 (×4): qty 4

## 2019-03-28 MED ORDER — FUROSEMIDE 10 MG/ML IJ SOLN: 20.0000 mg | Freq: Two times a day (BID) | INTRAMUSCULAR | Status: DC

## 2019-03-28 MED ORDER — ATORVASTATIN CALCIUM 80 MG PO TABS
80.0000 mg | ORAL_TABLET | Freq: Every day | ORAL | Status: DC
  Administered 2019-03-28 – 2019-03-29 (×2): 80 mg via ORAL
  Filled 2019-03-28 (×2): qty 1

## 2019-03-28 MED ORDER — MOMETASONE FURO-FORMOTEROL FUM 100-5 MCG/ACT IN AERO
2.0000 | INHALATION_SPRAY | Freq: Two times a day (BID) | RESPIRATORY_TRACT | Status: DC
  Administered 2019-03-28 – 2019-03-30 (×4): 2 via RESPIRATORY_TRACT
  Filled 2019-03-28: qty 8.8

## 2019-03-28 MED ORDER — GUAIFENESIN ER 600 MG PO TB12
600.0000 mg | ORAL_TABLET | Freq: Two times a day (BID) | ORAL | Status: DC
  Administered 2019-03-28 – 2019-03-30 (×5): 600 mg via ORAL
  Filled 2019-03-28 (×5): qty 1

## 2019-03-28 MED ORDER — MIDAZOLAM HCL 2 MG/2ML IJ SOLN: 2.0000 mg | Freq: Four times a day (QID) | INTRAMUSCULAR | Status: DC | PRN

## 2019-03-28 NOTE — Evaluation (Signed)
Occupational Therapy Evaluation Patient Details Name: Joan Webb MRN: [id number] DOB: 1971/02/12 Today's Date: 03/28/2019    History of Present Illness Pt is a 47 yo female with PMH of DM, tobacco abuse who came to ED for chest pain on 11/4. Pt found to have a STEMI and underwent a CABGx2. Pt was complaining of R sided weakness post surgery. pt also found to have had a CVA per Dr. Wilford Corner. Head CT-Small focus of hypodensity within the left centrum semiovale/corona radiatia suspicious for acute/subacute infarct.   Clinical Impression   PTA patient independent. Admitted for above and limited by problem list below, including R hemiparesis, sternal precautions, impaired balance, decreased activity tolerance and impaired cognition.  Patient currently requires min-mod assist for transfers, max assist for LB ADls, mod-max assist for UB ADls, min-mod assist for grooming.  Patient presents with difficulty sequencing, slow processing, decreased attention, and poor problem solving. Patient will benefit from continued OT services while admitted and after dc at CIR level in order to optimize independence and safety with ADLs/mobility.     Follow Up Recommendations  CIR;Supervision/Assistance - 24 hour    Equipment Recommendations  3 in 1 bedside commode    Recommendations for Other Services       Precautions / Restrictions Precautions Precautions: Sternal Precaution Booklet Issued: No Precaution Comments: pt educated on sternal precautions, also noted to have R hemiparesis Restrictions Weight Bearing Restrictions: Yes Other Position/Activity Restrictions: sternal precautions      Mobility Bed Mobility               General bed mobility comments: up in chair  Transfers Overall transfer level: Needs assistance Equipment used: 1 person hand held assist Transfers: Sit to/from Stand Sit to Stand: Mod assist;Min assist         General transfer comment: mod assist progressing to min  assist during transfers, cueing for sternal precautions and technique     Balance Overall balance assessment: Needs assistance Sitting-balance support: Feet supported;No upper extremity supported Sitting balance-Leahy Scale: Fair     Standing balance support: During functional activity Standing balance-Leahy Scale: Poor Standing balance comment: Able to stand statically but needs min A at times for support.                           ADL either performed or assessed with clinical judgement   ADL Overall ADL's : Needs assistance/impaired     Grooming: Moderate assistance;Sitting   Upper Body Bathing: Sitting;Moderate assistance   Lower Body Bathing: +2 for physical assistance;+2 for safety/equipment;Maximal assistance;Sit to/from stand   Upper Body Dressing : Maximal assistance;Sitting   Lower Body Dressing: Maximal assistance;+2 for physical assistance;Sit to/from stand   Toilet Transfer: Moderate assistance;Minimal assistance;+2 for safety/equipment;Ambulation           Functional mobility during ADLs: Moderate assistance;Minimal assistance;Cueing for safety;Cueing for sequencing General ADL Comments: pt limited by cognition, R hemi, sternal precautions and decreased activity tolerance     Vision Baseline Vision/History: Wears glasses Wears Glasses: At all times Patient Visual Report: Blurring of vision Additional Comments: further assseesment required     Perception     Praxis      Pertinent Vitals/Pain Pain Assessment: Faces Faces Pain Scale: Hurts little more Pain Location: chest, grimacing with mobility Pain Descriptors / Indicators: Grimacing;Guarding Pain Intervention(s): Monitored during session     Hand Dominance Right   Extremity/Trunk Assessment Upper Extremity Assessment Upper Extremity Assessment: RUE deficits/detail RUE Deficits /  Details: grossly 2/5 shoulder and elbow, 0/5 wrist and hand; unable to use functionally and cueing to  avoid compensatory techniques of trunk  RUE Coordination: decreased fine motor;decreased gross motor   Lower Extremity Assessment Lower Extremity Assessment: Defer to PT evaluation   Cervical / Trunk Assessment Cervical / Trunk Assessment: Other exceptions Cervical / Trunk Exceptions: chest/sternal incision   Communication Communication Communication: No difficulties   Cognition Arousal/Alertness: Awake/alert Behavior During Therapy: WFL for tasks assessed/performed Overall Cognitive Status: Impaired/Different from baseline Area of Impairment: Problem solving;Awareness;Safety/judgement;Attention                   Current Attention Level: Sustained     Safety/Judgement: Decreased awareness of safety Awareness: Emergent Problem Solving: Slow processing;Difficulty sequencing;Requires verbal cues General Comments: pt reports decreased problem solving, sequencing and slow processing    General Comments  pre activity BP 111/68, post activity BP 107/72    Exercises     Shoulder Instructions      Home Living Family/patient expects to be discharged to:: Private residence Living Arrangements: Alone(but plans to live with children at dc) Available Help at Discharge: Family;Available PRN/intermittently Type of Home: House Home Access: Stairs to enter [employer] of Steps: 2 Entrance Stairs-Rails: Can reach both Home Layout: One level     Bathroom Shower/Tub: Chief Strategy Officer: Standard                Prior Functioning/Environment Level of Independence: Independent                 OT Problem List: Decreased strength;Decreased activity tolerance;Impaired balance (sitting and/or standing);Decreased coordination;Decreased cognition;Decreased safety awareness;Decreased knowledge of use of DME or AE;Decreased knowledge of precautions;Cardiopulmonary status limiting activity;Pain;Impaired UE functional use      OT  Treatment/Interventions: Self-care/ADL training;Therapeutic exercise;DME and/or AE instruction;Energy conservation;Therapeutic activities;Cognitive remediation/compensation;Patient/family education;Balance training    OT Goals(Current goals can be found in the care plan section) Acute Rehab OT Goals Patient Stated Goal: to use my R hand  OT Goal Formulation: With patient Time For Goal Achievement: 04/11/19 Potential to Achieve Goals: Good  OT Frequency: Min 2X/week   Barriers to D/C:            Co-evaluation PT/OT/SLP Co-Evaluation/Treatment: Yes Reason for Co-Treatment: For patient/therapist safety;To address functional/ADL transfers PT goals addressed during session: Mobility/safety with mobility;Balance OT goals addressed during session: ADL's and self-care      AM-PAC OT "6 Clicks" Daily Activity     Outcome Measure Help from another person eating meals?: Total(NPO) Help from another person taking care of personal grooming?: A Lot Help from another person toileting, which includes using toliet, bedpan, or urinal?: A Lot Help from another person bathing (including washing, rinsing, drying)?: A Lot Help from another person to put on and taking off regular upper body clothing?: A Lot Help from another person to put on and taking off regular lower body clothing?: A Lot 6 Click Score: 11   End of Session Equipment Utilized During Treatment: Gait belt;Oxygen Nurse Communication: Mobility status  Activity Tolerance: Patient tolerated treatment well Patient left: in chair;with call bell/phone within reach  OT Visit Diagnosis: Other abnormalities of gait and mobility (R26.89);Muscle weakness (generalized) (M62.81);Other symptoms and signs involving cognitive function;Hemiplegia and hemiparesis Hemiplegia - Right/Left: Right Hemiplegia - dominant/non-dominant: Dominant Hemiplegia - caused by: Cerebral infarction                Time: 1791-5056 OT Time Calculation (min): 40  min Charges:  OT General Charges $OT Visit: 1 Visit OT Evaluation $OT Eval Moderate Complexity: 1 Mod OT Treatments $Self Care/Home Management : 8-22 mins  Delight Stare, OT Acute Rehabilitation Services Pager [phone] Office [phone]    Delight Stare 03/28/2019, 12:59 PM

## 2019-03-28 NOTE — Progress Notes (Signed)
Patient ID: Joan Webb, female   DOB: [birth year], 48 y.o.   MRN: [id number] TCTS Evening Rounds:  Hemodynamically stable in sinus rhythm  sats 95% on 2L  Urine output good.  Passed swallow eval.  Seen by PT and OT. Will request CIR consult.

## 2019-03-28 NOTE — Discharge Instructions (Signed)

## 2019-03-28 NOTE — Evaluation (Signed)
Clinical/Bedside Swallow Evaluation Patient Details  Name: Joan Webb MRN: [id number] Date of Birth: Feb 15, 1971  Today's Date: 03/28/2019 Time: SLP Start Time (ACUTE ONLY): 1508 SLP Stop Time (ACUTE ONLY): 1529 SLP Time Calculation (min) (ACUTE ONLY): 21 min  Past Medical History:  Past Medical History:  Diagnosis Date  . Anxiety   . Coronary artery disease   . Diabetes (HCC)   . Hyperlipidemia   . Myocardial infarction (HCC)   . Tobacco abuse    Past Surgical History:  Past Surgical History:  Procedure Laterality Date  . CARDIAC CATHETERIZATION    . LEFT HEART CATH AND CORONARY ANGIOGRAPHY N/A 03/23/2019   Procedure: LEFT HEART CATH AND CORONARY ANGIOGRAPHY;  Surgeon: Kathleene Hazel, MD;  Location: [facility];  Service: Cardiovascular;  Laterality: N/A;  . None     HPI:  Joan Webb is a 48 y.o. female past medical history coronary artery disease status post coronary artery bypass graft postop day 2, anxiety, diabetes, hyperlipidemia, tobacco abuse, admitted in the cardiac ICU after coronary artery bypass grafting done 2 days ago started complaining of right arm weakness and numbness in both lower extremities.  Head CT revealed "Small focus of hypodensity within the left centrum semiovale/corona radiata suspicious for acute/subacute infarct."    Assessment / Plan / Recommendation Clinical Impression  Pt was seen for a bedside swallow evaluation in the setting of a possible acute infarct and recent extubation.  Pt was encountered awake/alert, lying semi-reclined in bed with daughter-in-law present at bedside.  Pt denied hx of dysphagia, GERD, or PNA.  She exhibited a mildly hoarse vocal quality; however it was noted to be improved from yesterday's cognitive-linguistic evaluation.  She presents with minimal oral dysphagia and suspected functional pharyngeal phase of the swallow.  Pt was observed with trials of thin liquid, puree, and regular solids.  She was able to  eat/drink independently given set up.  Pt exhibited good bolus acceptance and timely AP transport with all trials, and she exhibited mildly prolonged but effective mastication of regular solids.  She exhibited a delayed throat clear following completion of [employer] challenge and a delayed cough x1 following large regular solid trial when pt was speaking during mastication.  No clinical s/sx of aspiration were observed with any additional trials despite challenging.  Of note, pt was observed to be impulsive and distractible throughout this tx session, requiring intermittent verbal cues to maintain attention to po trials.  Recommend regular solids and thin liquid with the following precautions: 1) Limit distractions 2) Small bites/sips 3) Slow rate of intake 4) Sit upright as possible.  SLP educated pt and family regarding all recommendations and they verbalized understanding via teach back.  SLP will f/u briefly to monitor diet tolerance.    SLP Visit Diagnosis: Dysphagia, unspecified (R13.10)    Aspiration Risk  Mild aspiration risk    Diet Recommendation Regular;Thin liquid   Liquid Administration via: Straw;Cup Medication Administration: Whole meds with liquid Supervision: Patient able to self feed;Intermittent supervision to cue for compensatory strategies Compensations: Minimize environmental distractions;Slow rate;Small sips/bites Postural Changes: Seated upright at 90 degrees    Other  Recommendations Oral Care Recommendations: Oral care BID   Follow up Recommendations Inpatient Rehab      Frequency and Duration min 2x/week  2 weeks       Prognosis        Swallow Study   General HPI: Joan Webb is a 48 y.o. female past medical history coronary artery disease  status post coronary artery bypass graft postop day 2, anxiety, diabetes, hyperlipidemia, tobacco abuse, admitted in the cardiac ICU after coronary artery bypass grafting done 2 days ago started complaining of right  arm weakness and numbness in both lower extremities.  Head CT revealed "Small focus of hypodensity within the left centrum semiovale/corona radiata suspicious for acute/subacute infarct."  Type of Study: Bedside Swallow Evaluation Previous Swallow Assessment: None  Diet Prior to this Study: Regular;Thin liquids Temperature Spikes Noted: Yes Respiratory Status: Nasal cannula History of Recent Intubation: Yes Length of Intubations (days): 1 days Date extubated: 03/26/19 Behavior/Cognition: Alert;Cooperative;Pleasant mood;Distractible;Impulsive Oral Cavity Assessment: Within Functional Limits Oral Care Completed by SLP: No Oral Cavity - Dentition: Adequate natural dentition Vision: Functional for self-feeding Self-Feeding Abilities: Able to feed self;Needs set up Patient Positioning: Upright in bed Baseline Vocal Quality: Hoarse Volitional Cough: Strong Volitional Swallow: Able to elicit    Oral/Motor/Sensory Function Overall Oral Motor/Sensory Function: Within functional limits   Ice Chips Ice chips: Within functional limits Presentation: Spoon   Thin Liquid Thin Liquid: Impaired Presentation: Spoon;Cup;Straw;Self Fed Pharyngeal  Phase Impairments: Throat Clearing - Delayed    Nectar Thick Nectar Thick Liquid: Not tested   Honey Thick Honey Thick Liquid: Not tested   Puree Puree: Within functional limits   Solid     Solid: Impaired Presentation: Spoon Oral Phase Impairments: Impaired mastication Oral Phase Functional Implications: Impaired mastication;Prolonged oral transit Pharyngeal Phase Impairments: Cough - Delayed     [name] M.S., CCC-SLP Acute Rehabilitation Services Office: [phone]  Joan Webb Joan Webb 03/28/2019,3:42 PM

## 2019-03-28 NOTE — Progress Notes (Signed)
3 Days Post-Op Procedure(s) (LRB): CORONARY ARTERY BYPASS GRAFTING (CABG) X THREE, ON PUMP, USING LEFT INTERNAL MAMMARY ARTERY AND LEFT GREATER SAPHENOUS VEIN HARVESTED ENDOSCOPICALLY (N/A) TRANSESOPHAGEAL ECHOCARDIOGRAM (TEE) (N/A) Subjective: Weak R hand, leg frpm periop stroke nsr Pain controlled Objective: Vital signs in last 24 hours: Temp:  [97.8 F (36.6 C)-98.9 F (37.2 C)] 98.8 F (37.1 C) (11/09 0700) Pulse Rate:  [88-102] 94 (11/09 0600) Cardiac Rhythm: Normal sinus rhythm;Sinus tachycardia (11/08 2000) BP: (78-120)/(45-77) 107/61 (11/09 0600) SpO2:  [96 %-100 %] 97 % (11/09 0600) Weight:  [81.8 kg] 81.8 kg (11/09 0600)  Hemodynamic parameters for last 24 hours:  stable  Intake/Output from previous day: 11/08 0701 - 11/09 0700 In: 2013.2 [P.O.:1000; I.V.:1013.2] Out: 1285 [Urine:1285] Intake/Output this shift: No intake/output data recorded.       Exam    General- alert and comfortable    Neck- no JVD, no cervical adenopathy palpable, no carotid bruit   Lungs- clear without rales, wheezes   Cor- regular rate and rhythm, no murmur , gallop   Abdomen- soft, non-tender   Extremities - warm, non-tender, minimal edema   Neuro- oriented, appropriate, no focal weakness    Lab Results: Recent Labs    03/27/19 0250 03/28/19 0408  WBC 10.5 10.4  HGB 8.9* 8.5*  HCT 26.7* 25.8*  PLT 168 216   BMET:  Recent Labs    03/27/19 0250 03/28/19 0408  NA 133* 135  K 4.1 4.1  CL 100 100  CO2 24 26  GLUCOSE 156* 115*  BUN 13 14  CREATININE 0.64 0.56  CALCIUM 8.4* 8.5*    PT/INR:  Recent Labs    03/25/19 2000  LABPROT 15.8*  INR 1.3*   ABG    Component Value Date/Time   PHART 7.358 03/26/2019 0431   HCO3 21.5 03/26/2019 0431   TCO2 23 03/26/2019 0431   ACIDBASEDEF 3.0 (H) 03/26/2019 0431   O2SAT 57.2 03/28/2019 0345   CBG (last 3)  Recent Labs    03/28/19 0030 03/28/19 0339 03/28/19 0734  GLUCAP 98 116* 125*    Assessment/Plan: S/P  Procedure(s) (LRB): CORONARY ARTERY BYPASS GRAFTING (CABG) X THREE, ON PUMP, USING LEFT INTERNAL MAMMARY ARTERY AND LEFT GREATER SAPHENOUS VEIN HARVESTED ENDOSCOPICALLY (N/A) TRANSESOPHAGEAL ECHOCARDIOGRAM (TEE) (N/A) Mobilize Diuresis Diabetes control swallow eval and diet   LOS: 5 days    Joan Webb 03/28/2019

## 2019-03-28 NOTE — Progress Notes (Signed)
Physical Therapy Treatment Patient Details Name: Joan Webb MRN: [id number] DOB: [birth year] Today's Date: 03/28/2019    History of Present Illness Pt is a 48 yo female with PMH of DM, tobacco abuse who came to ED for chest pain on 11/4. Pt found to have a STEMI and underwent a CABGx2. Pt was complaining of R sided weakness post surgery. pt also found to have had a CVA per Dr. Rory Percy. Head CT-Small focus of hypodensity within the left centrum semiovale/corona radiatia suspicious for acute/subacute infarct.    PT Comments    Patient progressing well towards PT goals. Understandably frustrated with not being able to use her RUE as she wants too. Tolerated gait training with Mod-min A for balance/safety as pt noted to have right knee instability. Pt also with 2/4 DOE with Sp02 >89% on RA. Requires frequent rest breaks as she reports she feels her "heart is beating fast." HR up to 128 bpm. Highly motivated to return to PLOF. Reviewed sternal precautions. Will continue to follow and progress as tolerated.   Follow Up Recommendations  CIR     Equipment Recommendations  Other (comment)(TBD)    Recommendations for Other Services       Precautions / Restrictions Precautions Precautions: Sternal Precaution Booklet Issued: No Precaution Comments: pt educated on sternal precautions, also noted to have R hemiparesis Restrictions Weight Bearing Restrictions: Yes Other Position/Activity Restrictions: sternal precautions    Mobility  Bed Mobility               General bed mobility comments: up in chair upon PT arrival.  Transfers Overall transfer level: Needs assistance Equipment used: 1 person hand held assist Transfers: Sit to/from Stand Sit to Stand: Mod assist;Min assist         General transfer comment: Initial Mod A progressing to min A to power to standing with cues to adhere to sternal precautions and use of body momentum. Stood from chair x2, from toilet  x1  Ambulation/Gait Ambulation/Gait assistance: Mod assist;Min assist Gait Distance (Feet): 14 Feet(x2 bouts) Assistive device: 1 person hand held assist;None;2 person hand held assist Gait Pattern/deviations: Step-through pattern;Decreased stance time - right;Decreased step length - left;Narrow base of support Gait velocity: decreased   General Gait Details: Slow, unsteady gait with right knee instability. Initially bil HHA progressing to 1 HHA progressing to no HHA but Min A for balance. + dizziness. 2/4 DOE noted requiring seated and standing rest breaks. Sp02 remained >89% on RA. HR up to 128 bpm.   Stairs             Wheelchair Mobility    Modified Rankin (Stroke Patients Only) Modified Rankin (Stroke Patients Only) Pre-Morbid Rankin Score: No symptoms Modified Rankin: Moderately severe disability     Balance Overall balance assessment: Needs assistance Sitting-balance support: Feet supported;No upper extremity supported Sitting balance-Leahy Scale: Fair     Standing balance support: During functional activity Standing balance-Leahy Scale: Poor Standing balance comment: Able to stand statically but needs min A at times for support.                            Cognition Arousal/Alertness: Awake/alert Behavior During Therapy: WFL for tasks assessed/performed Overall Cognitive Status: Impaired/Different from baseline Area of Impairment: Problem solving;Awareness;Safety/judgement                         Safety/Judgement: Decreased awareness of safety("let me hold my own hand  for my balance, see what I mean?")   Problem Solving: Slow processing General Comments: reports feeling like she has more difficulty with her thinking since the stroke. Able to state months of year in reverse with 1 error and increased time. Able to count backwards from 20 without difficulty; "wow yall are making me think." "I don't understand why I cannot use this arm."       Exercises      General Comments General comments (skin integrity, edema, etc.): pre activity BP 111/68, post activity BP 107/72      Pertinent Vitals/Pain Pain Assessment: No/denies pain    Home Living                      Prior Function            PT Goals (current goals can now be found in the care plan section) Progress towards PT goals: Progressing toward goals    Frequency    Min 4X/week      PT Plan Current plan remains appropriate    Co-evaluation PT/OT/SLP Co-Evaluation/Treatment: Yes Reason for Co-Treatment: For patient/therapist safety;To address functional/ADL transfers PT goals addressed during session: Mobility/safety with mobility;Balance        AM-PAC PT "6 Clicks" Mobility   Outcome Measure  Help needed turning from your back to your side while in a flat bed without using bedrails?: A Lot Help needed moving from lying on your back to sitting on the side of a flat bed without using bedrails?: A Lot Help needed moving to and from a bed to a chair (including a wheelchair)?: A Little Help needed standing up from a chair using your arms (e.g., wheelchair or bedside chair)?: A Little Help needed to walk in hospital room?: A Little Help needed climbing 3-5 steps with a railing? : Total 6 Click Score: 14    End of Session Equipment Utilized During Treatment: Gait belt Activity Tolerance: Patient tolerated treatment well Patient left: in chair;with call bell/phone within reach Nurse Communication: Mobility status PT Visit Diagnosis: Unsteadiness on feet (R26.81);Muscle weakness (generalized) (M62.81);Difficulty in walking, not elsewhere classified (R26.2);Hemiplegia and hemiparesis Hemiplegia - Right/Left: Right Hemiplegia - dominant/non-dominant: Dominant Hemiplegia - caused by: Cerebral infarction     Time: 0800-0839 PT Time Calculation (min) (ACUTE ONLY): 39 min  Charges:  $Gait Training: 8-22 mins                     Vale Haven, PT, DPT Acute Rehabilitation Services Pager [phone] Office [phone]       Blake Divine A Lanier Ensign 03/28/2019, 12:42 PM

## 2019-03-28 NOTE — Discharge Summary (Addendum)
Physician Discharge Summary       [address]       [place] [id number]             [phone]    Patient ID: Joan Webb MRN: [id number] DOB/AGE: [birth year] 49 y.o.  Admit date: 03/23/2019 Discharge date: 03/30/2019  Admission Diagnoses: 1.  ST elevation myocardial infarction (STEMI) of inferior wall (HCC) 2. Coronary artery disease  Discharge Diagnoses:  1.   S/P CABG x 3 2. Post op CVA 3. Expected ABL anemia 4. History of Diabetes (HCC) 5. History of Hyperlipidemia 6. History of Tobacco abuse    Consults: neurology  Procedure (s):  CORONARY ARTERY BYPASS GRAFTING  X THREE, ON PUMP, USING LEFT INTERNAL MAMMARY ARTERY AND LEFT GREATER SAPHENOUS VEIN HARVESTED ENDOSCOPICALLY by Dr. Donata ClayVan Trigt on 03/25/2019.  History of Presenting Illness: Patient is a 48 year old diabetic smoker with family history of CAD who called 911 after she had persistent bilateral chest pain with physical exertion. Her EKG showed mild ST elevation in the inferior leads.  She was taken directly to the Cath Lab after her Covid screen.  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  Her chest pain resolved and she was admitted to the CCU.  Her cardiac enzymes were marginally elevated and her hemodynamic stable.  Is felt that the ST elevation was due to an occlusion of a small posterolateral branch of her dominant right coronary, probably not graftable or stsentable.  I saw the patient as an urgent consult in the Cath Lab and discussed the patient's care with her cardiologist.  We both thought that surgical revascularization would be indicated due to her multivessel disease, diabetes and acute presentation.  The patient however refused to have surgery at that point when she was clearly frightened and emotional.  Since that time the patient is discussed the situation again with her cardiologist and is willing to proceed  with multivessel CABG.  She understands that she should benefit from this procedure with respect to preservation of LV function, improve survival, and improve symptoms.  She has been started on anticoagulation with heparin and Cangrelor.  She has stabilized and chest pain resolved. Dr. Donata ClayVan Trigt discussed the need for coronary artery bypass grafting surgery. Potential risks, benefits, and complications of the surgery were discussed with the patient. Pre operative carotid duplex US showed no significant internal carotid artery stenosis bilaterally. Patient underwent a CABG x 4 on 03/25/2019.  Brief Hospital Course:  The patient was extubated the evening of surgery without difficulty. She remained afebrile and hemodynamically stable. She was weaned off Milrinone drip. Theone MurdochSwan Ganz, a line, chest tubes, and foley were removed early in the post operative course. She developed weakness in RUE and bilateral lower extremity weakness on 11/08. This was concerning for CVA so neurology was consulted. CT of head and neck  Showed small focus of hypodensity within the left centrum semiovale/corona radiata suspicious for acute/subacute infarct. The bilateral common carotid, internal carotid and vertebral arteries are patent within the neck without significant stenosis. No intracranial large vessel occlusion or identified high-grade proximal arterial stenosis. Patient was put on ec asa 81 mg daily and Plavix 75 mg daily. Speech pathology consult was obtained and recommendations were followed accordingly. PT and OT consults were obtained. CIR was recommended. Lopressor was started and titrated accordingly. She was volume over loaded and diuresed. She had ABL anemia. She did not require a  post op transfusion. Last H and H was 8.5 and 25.9. She was weaned off the insulin drip.  Once she was tolerating a diet, home diabetic medicines were restarted.  The patient's glucose remained well controlled.The patient's HGA1C pre op was  7.9.  She will need close medical follow up after discharge. The patient was felt surgically stable for transfer from the ICU to CIR on 11/11. .She continues to progress with cardiac rehab. She initially required a few liters of oxygen via [place] and she was weaned to room air. She has been tolerating a diet and has had a bowel movement. Epicardial pacing wires were removed on 11/10. Chest tube sutures will be removed the day of discharge. The patient is felt surgically stable for discharge to CIR today.   Latest Vital Signs: Blood pressure (!) 160/57, pulse 77, temperature 98.3 F (36.8 C), temperature source Oral, resp. rate 15, height 5\' 2"  (1.575 m), weight 82.9 kg, SpO2 96 %.  Physical Exam: General: feels well HEENT: Normocephalic pupils equal , dentition adequate Neck: Supple without JVD, adenopathy, or bruit Chest: Clear to auscultation, symmetrical breath sounds, no rhonchi, no tenderness             or deformity. Incision clean, dry Cardiovascular: Regular rate and rhythm, no murmur, no gallop, peripheral pulses             palpable in all extremities Abdomen:  Soft, nontender, no palpable mass or organomegaly Extremities: Warm, well-perfused, no clubbing cyanosis edema or tenderness,              no venous stasis changes of the legs Rectal/GU: Deferred Neuro: unable to move R wrist, R hand Skin: Clean and dry without rash or ulceration  Discharge Condition: Stable and discharged to CIR.  Recent laboratory studies:  Lab Results  Component Value Date   WBC 7.7 03/30/2019   HGB 8.3 (L) 03/30/2019   HCT 25.2 (L) 03/30/2019   MCV 88.1 03/30/2019   PLT 303 03/30/2019   Lab Results  Component Value Date   NA 135 03/30/2019   K 3.7 03/30/2019   CL 96 (L) 03/30/2019   CO2 28 03/30/2019   CREATININE 0.58 03/30/2019   GLUCOSE 137 (H) 03/30/2019      Diagnostic Studies: Ct Angio Head W Or Wo Contrast  Result Date: 03/27/2019 CLINICAL DATA:  Right-sided weakness. EXAM: CT  ANGIOGRAPHY HEAD AND NECK TECHNIQUE: Multidetector CT imaging of the head and neck was performed using the standard protocol during bolus administration of intravenous contrast. Multiplanar CT image reconstructions and MIPs were obtained to evaluate the vascular anatomy. Carotid stenosis measurements (when applicable) are obtained utilizing NASCET criteria, using the distal internal carotid diameter as the denominator. CONTRAST:  75mL OMNIPAQUE IOHEXOL 350 MG/ML SOLN COMPARISON:  No pertinent prior studies available for comparison. FINDINGS: CT HEAD FINDINGS Brain: No evidence of acute intracranial hemorrhage. There is a small focus of hypodensity within the left centrum semiovale/corona radiata suspicious for acute/subacute infarct. No cortical infarct is identified. No evidence intracranial mass. No midline shift or extra-axial fluid collection. Cerebral volume is normal for age. Vascular: Reported separately Skull: Normal. Negative for fracture or focal lesion. Sinuses: No significant paranasal sinus disease or mastoid effusion. Orbits: Visualized orbits demonstrate no acute abnormality. Review of the MIP images confirms the above findings CTA NECK FINDINGS Aortic arch: Standard branching. No significant plaque within the visualized aortic arch or proximal major branch vessels of the neck. Right carotid system: CCA and ICA patent without  measurable stenosis. Mild noncalcified plaque within the carotid bifurcation Left carotid system: CCA and ICA patent within the neck without measurable stenosis. No significant atherosclerotic disease. Vertebral arteries: Left vertebral artery dominant. The vertebral arteries are patent within the neck bilaterally without significant stenosis (50% or greater. No significant atherosclerotic disease. Skeleton: Sequela of recent median sternotomy. Otherwise, no acute bony abnormality Other neck: No soft tissue neck mass or pathologically enlarged cervical chain lymph nodes.  Subcentimeter calcified nodule within the left thyroid lobe not meeting consensus criteria for ultrasound follow-up. Upper chest: Sequela of recent median sternotomy. Patchy airspace opacities within the imaged lung apices. Partially imaged bilateral pleural effusions. Partially visualized right IJ approach central venous catheter Review of the MIP images confirms the above findings CTA HEAD FINDINGS Anterior circulation: Minimal calcified plaque within the intracranial internal carotid arteries without stenosis. Motion degradation somewhat limits evaluation of the proximal middle cerebral arteries. The bilateral middle and anterior cerebral arteries are patent without significant proximal stenosis identified. No intracranial aneurysm is identified. Posterior circulation: The non dominant intracranial right vertebral artery is diminutive beyond the origin of the right PICA. The bilateral intracranial vertebral arteries are patent without significant stenosis. The basilar artery is patent without significant stenosis. The bilateral posterior cerebral arteries are patent without significant proximal stenosis. Large bilateral posterior communicating arteries are present. Venous sinuses: Within limitations of contrast timing, no convincing thrombus. Anatomic variants: None significant Review of the MIP images confirms the above findings These results were called by telephone at the time of interpretation on 03/27/2019 at 11:56 am to provider [facility] , who verbally acknowledged these results. IMPRESSION: CT head: Small focus of hypodensity within the left centrum semiovale/corona radiata suspicious for acute/subacute infarct. CTA neck: 1. The bilateral common carotid, internal carotid and vertebral arteries are patent within the neck without significant stenosis. 2. Sequela of recent median sternotomy. Patchy airspace disease within the bilateral lung apices may reflect postoperative edema. Infection not excluded.  Partially imaged bilateral pleural effusions. CTA head: 1. Motion degraded examination which somewhat limits evaluation of the proximal middle cerebral arteries. 2. No intracranial large vessel occlusion or identified high-grade proximal arterial stenosis. Electronically Signed   By: Kellie Simmering DO   On: 03/27/2019 12:12   Ct Angio Neck W Or Wo Contrast  Result Date: 03/27/2019 CLINICAL DATA:  Right-sided weakness. EXAM: CT ANGIOGRAPHY HEAD AND NECK TECHNIQUE: Multidetector CT imaging of the head and neck was performed using the standard protocol during bolus administration of intravenous contrast. Multiplanar CT image reconstructions and MIPs were obtained to evaluate the vascular anatomy. Carotid stenosis measurements (when applicable) are obtained utilizing NASCET criteria, using the distal internal carotid diameter as the denominator. CONTRAST:  44mL OMNIPAQUE IOHEXOL 350 MG/ML SOLN COMPARISON:  No pertinent prior studies available for comparison. FINDINGS: CT HEAD FINDINGS Brain: No evidence of acute intracranial hemorrhage. There is a small focus of hypodensity within the left centrum semiovale/corona radiata suspicious for acute/subacute infarct. No cortical infarct is identified. No evidence intracranial mass. No midline shift or extra-axial fluid collection. Cerebral volume is normal for age. Vascular: Reported separately Skull: Normal. Negative for fracture or focal lesion. Sinuses: No significant paranasal sinus disease or mastoid effusion. Orbits: Visualized orbits demonstrate no acute abnormality. Review of the MIP images confirms the above findings CTA NECK FINDINGS Aortic arch: Standard branching. No significant plaque within the visualized aortic arch or proximal major branch vessels of the neck. Right carotid system: CCA and ICA patent without measurable stenosis. Mild noncalcified plaque  within the carotid bifurcation Left carotid system: CCA and ICA patent within the neck without measurable  stenosis. No significant atherosclerotic disease. Vertebral arteries: Left vertebral artery dominant. The vertebral arteries are patent within the neck bilaterally without significant stenosis (50% or greater. No significant atherosclerotic disease. Skeleton: Sequela of recent median sternotomy. Otherwise, no acute bony abnormality Other neck: No soft tissue neck mass or pathologically enlarged cervical chain lymph nodes. Subcentimeter calcified nodule within the left thyroid lobe not meeting consensus criteria for ultrasound follow-up. Upper chest: Sequela of recent median sternotomy. Patchy airspace opacities within the imaged lung apices. Partially imaged bilateral pleural effusions. Partially visualized right IJ approach central venous catheter Review of the MIP images confirms the above findings CTA HEAD FINDINGS Anterior circulation: Minimal calcified plaque within the intracranial internal carotid arteries without stenosis. Motion degradation somewhat limits evaluation of the proximal middle cerebral arteries. The bilateral middle and anterior cerebral arteries are patent without significant proximal stenosis identified. No intracranial aneurysm is identified. Posterior circulation: The non dominant intracranial right vertebral artery is diminutive beyond the origin of the right PICA. The bilateral intracranial vertebral arteries are patent without significant stenosis. The basilar artery is patent without significant stenosis. The bilateral posterior cerebral arteries are patent without significant proximal stenosis. Large bilateral posterior communicating arteries are present. Venous sinuses: Within limitations of contrast timing, no convincing thrombus. Anatomic variants: None significant Review of the MIP images confirms the above findings These results were called by telephone at the time of interpretation on 03/27/2019 at 11:56 am to provider [facility] , who verbally acknowledged these results.  IMPRESSION: CT head: Small focus of hypodensity within the left centrum semiovale/corona radiata suspicious for acute/subacute infarct. CTA neck: 1. The bilateral common carotid, internal carotid and vertebral arteries are patent within the neck without significant stenosis. 2. Sequela of recent median sternotomy. Patchy airspace disease within the bilateral lung apices may reflect postoperative edema. Infection not excluded. Partially imaged bilateral pleural effusions. CTA head: 1. Motion degraded examination which somewhat limits evaluation of the proximal middle cerebral arteries. 2. No intracranial large vessel occlusion or identified high-grade proximal arterial stenosis. Electronically Signed   By: Jackey Loge DO   On: 03/27/2019 12:12   Dg Chest Port 1 View  Result Date: 03/30/2019 CLINICAL DATA:  Status post coronary bypass graft. EXAM: PORTABLE CHEST 1 VIEW COMPARISON:  March 29, 2019. FINDINGS: Stable cardiomegaly. Sternotomy wires are noted. No pneumothorax is noted. Stable bibasilar and perihilar opacities are noted concerning for edema or atelectasis. Small pleural effusions may be present. Bony thorax is unremarkable. IMPRESSION: Stable bilateral lung opacities as described above. Electronically Signed   By: Lupita Raider M.D.   On: 03/30/2019 08:08   Dg Chest Port 1 View  Result Date: 03/29/2019 CLINICAL DATA:  CABG.  Sore chest. EXAM: PORTABLE CHEST 1 VIEW COMPARISON:  03/27/2019. FINDINGS: Interim removal of right IJ sheath. Left PICC line noted with tip over cavoatrial junction. Epicardial pacer wire noted over the left chest base. Prior CABG. Cardiomegaly. Bilateral pulmonary infiltrates/edema progressed from prior exam. Progression of bibasilar atelectasis. Small bilateral pleural effusions cannot be excluded. No pneumothorax. IMPRESSION: 1. Interim removal of right IJ sheath. Left PICC line noted with tip over cavoatrial junction. 2.  Prior CABG.  Cardiomegaly again noted. 3.  Progressive bilateral pulmonary infiltrates/edema. Progressive bibasilar atelectasis. Small bilateral pleural effusions cannot be excluded. Electronically Signed   By: Maisie Fus  Register   On: 03/29/2019 06:16   Dg Chest Port 1 [address]  Result Date: 03/27/2019 CLINICAL DATA:  (coronary artery bypass graft) Update status EXAM: PORTABLE CHEST 1 VIEW COMPARISON:  Chest radiograph 03/26/2019, 03/25/2019 FINDINGS: Stable cardiomediastinal contours status post median sternotomy and CABG. Interval removal of a left chest tube and retraction of the PA catheter. There are increased bilateral diffuse interstitial opacities most likely representing pulmonary edema. There is persistent consolidation at the left base likely representing atelectasis and small volume pleural fluid. No evidence of pneumothorax. IMPRESSION: 1. Increased bilateral interstitial opacities, most likely representing pulmonary edema. 2. Persistent consolidation at the left base likely a combination of atelectasis and small volume pleural fluid. Electronically Signed   By: Emmaline Kluver M.D.   On: 03/27/2019 13:47   Dg Chest Port 1 View  Result Date: 03/26/2019 CLINICAL DATA:  LEFT HEART CATH AND CORONARY ANGIOGRAPHY on 03/23/19 and CABG x 3 on 03/25/19 EXAM: PORTABLE CHEST 1 VIEW COMPARISON:  Chest radiograph 03/25/2019 FINDINGS: Stable cardiomediastinal contours status post median sternotomy and CABG. Interval extubation. Unchanged position of a right central PA catheter. Left chest tube remains in place. Central venous congestion with be mild perihilar opacities likely mild pulmonary edema. Consolidation at the left base may reflect atelectasis and/or small amount of pleural fluid. No pneumothorax. IMPRESSION: 1. Central venous congestion with mild perihilar opacities likely mild edema. 2. Consolidation at the left base may reflect atelectasis and/or small amount of pleural fluid. Electronically Signed   By: Emmaline Kluver M.D.   On: 03/26/2019  11:18   Dg Chest Portable 1 View  Result Date: 03/25/2019 CLINICAL DATA:  Evaluate for pneumothorax. EXAM: PORTABLE CHEST 1 VIEW COMPARISON:  Chest radiograph 03/23/2019 FINDINGS: ET tube mid trachea. Mediastinal drain in place. Epicardial pacing wires. Pulmonary arterial catheter projects over the main pulmonary artery. Left chest tube in place. Widening of the mediastinal contours. Patient status post interval median sternotomy and CABG procedure. Low lung volumes. Left mid lower lung consolidative opacities. Bilateral interstitial opacities. No definite pneumothorax visualized. IMPRESSION: No definite pneumothorax visualized on limited portable exam. Support apparatus as above. Patchy consolidation left mid lower lung may be atelectasis and or postsurgical in etiology. Possible mild interstitial edema. Electronically Signed   By: Annia Belt M.D.   On: 03/25/2019 19:15   Dg Chest Port 1 View  Result Date: 03/23/2019 CLINICAL DATA:  Acute myocardial infarction.  Chest pain. EXAM: PORTABLE CHEST 1 VIEW COMPARISON:  None. FINDINGS: The heart size and mediastinal contours are within normal limits. Both lungs are clear. The visualized skeletal structures are unremarkable. IMPRESSION: Normal exam. Electronically Signed   By: Francene Boyers M.D.   On: 03/23/2019 11:53   Dg Abd Portable 1v  Result Date: 03/25/2019 CLINICAL DATA:  NG tube placement EXAM: PORTABLE ABDOMEN - 1 VIEW COMPARISON:  None. FINDINGS: The tip the NG tube is seen projecting over the mid body of the stomach. There is a left-sided chest tube present. A right-sided PA catheter is noted with the tip the main pulmonary artery. There is overlying median sternotomy wires. IMPRESSION: Tip of NG tube seen projecting over the mid body of the stomach. Electronically Signed   By: Jonna Clark M.D.   On: 03/25/2019 23:16   Vas US Doppler Pre Cabg  Result Date: 03/23/2019 PREOPERATIVE VASCULAR EVALUATION  Indications:      Pre-surgical evaluation.  Risk Factors:     Hyperlipidemia, Diabetes. Comparison Study: No prior study Performing Technologist: Gertie Fey MHA, RVT, RDCS, RDMS  Examination Guidelines: A complete evaluation includes B-mode imaging, spectral Doppler, color  Doppler, and power Doppler as needed of all accessible portions of each vessel. Bilateral testing is considered an integral part of a complete examination. Limited examinations for reoccurring indications may be performed as noted.  Right Carotid Findings: +----------+--------+--------+--------+----------------------+--------+             PSV cm/s EDV cm/s Stenosis Describe               Comments  +----------+--------+--------+--------+----------------------+--------+  CCA Prox   85       19                                                 +----------+--------+--------+--------+----------------------+--------+  CCA Distal 56       12                homogeneous and smooth           +----------+--------+--------+--------+----------------------+--------+  ICA Prox   90       23                                                 +----------+--------+--------+--------+----------------------+--------+  ICA Distal 69       25                                                 +----------+--------+--------+--------+----------------------+--------+  ECA        120      14                smooth and homogeneous           +----------+--------+--------+--------+----------------------+--------+ Portions of this table do not appear on this page. +----------+--------+-------+----------------+------------+             PSV cm/s EDV cms Describe         Arm Pressure  +----------+--------+-------+----------------+------------+  Subclavian 184              Multiphasic, WNL               +----------+--------+-------+----------------+------------+ +---------+--------+--+--------+-+---------+  Vertebral PSV cm/s 32 EDV cm/s 5 Antegrade  +---------+--------+--+--------+-+---------+ Left Carotid Findings:  +----------+--------+--------+--------+-----------------------+--------+             PSV cm/s EDV cm/s Stenosis Describe                Comments  +----------+--------+--------+--------+-----------------------+--------+  CCA Prox   100      20                                                  +----------+--------+--------+--------+-----------------------+--------+  CCA Distal 87       20                smooth and homogeneous            +----------+--------+--------+--------+-----------------------+--------+  ICA Prox   73       21                                                  +----------+--------+--------+--------+-----------------------+--------+  ICA Distal 97       32                                                  +----------+--------+--------+--------+-----------------------+--------+  ECA        144      15                smooth and heterogenous           +----------+--------+--------+--------+-----------------------+--------+ +----------+--------+--------+----------------+------------+  Subclavian PSV cm/s EDV cm/s Describe         Arm Pressure  +----------+--------+--------+----------------+------------+             106               Multiphasic, WNL 86            +----------+--------+--------+----------------+------------+ +---------+--------+--+--------+--+---------+  Vertebral PSV cm/s 45 EDV cm/s 11 Antegrade  +---------+--------+--+--------+--+---------+  ABI Findings: +--------+------------------+-----+---------+----------------------------------+  Right    Rt Pressure (mmHg) Index Waveform  Comment                             +--------+------------------+-----+---------+----------------------------------+  Brachial                          triphasic Unable to obtain pressure due to                                                 TR band                             +--------+------------------+-----+---------+----------------------------------+  PTA      84                 0.98  triphasic                                      +--------+------------------+-----+---------+----------------------------------+  DP       92                 1.07  triphasic                                     +--------+------------------+-----+---------+----------------------------------+ +--------+------------------+-----+---------+-------+  Left     Lt Pressure (mmHg) Index Waveform  Comment  +--------+------------------+-----+---------+-------+  Brachial 86                       triphasic          +--------+------------------+-----+---------+-------+  PTA      104                1.21  triphasic          +--------+------------------+-----+---------+-------+  DP       97                 1.13  triphasic          +--------+------------------+-----+---------+-------+ +-------+---------------+----------------+  ABI/TBI Today's ABI/TBI Previous ABI/TBI  +-------+---------------+----------------+  Right   1.07                              +-------+---------------+----------------+  Left    1.21                              +-------+---------------+----------------+  Right Doppler Findings: +-----------+--------+-----+---------+----------------------------------------+  Site        Pressure Index Doppler   Comments                                  +-----------+--------+-----+---------+----------------------------------------+  Brachial                   triphasic Unable to obtain pressure due to TR band  +-----------+--------+-----+---------+----------------------------------------+  Radial                     triphasic                                           +-----------+--------+-----+---------+----------------------------------------+  Ulnar                      triphasic                                           +-----------+--------+-----+---------+----------------------------------------+  Palmar Arch                          Unable to evaluate due to TR band          +-----------+--------+-----+---------+----------------------------------------+  Left Doppler Findings: +-----------+--------+-----+---------+-----------------------------------------+  Site        Pressure Index Doppler   Comments                                   +-----------+--------+-----+---------+-----------------------------------------+  Brachial    86             triphasic                                            +-----------+--------+-----+---------+-----------------------------------------+  Radial                     triphasic                                            +-----------+--------+-----+---------+-----------------------------------------+  Ulnar                      triphasic                                            +-----------+--------+-----+---------+-----------------------------------------+  Palmar Arch  Signal is unaffected with radial                                                 compresion, obliterates with ulnar                                               compression.                               +-----------+--------+-----+---------+-----------------------------------------+  Summary: Right Carotid: Velocities in the right ICA are consistent with a 1-39% stenosis. Left Carotid: Velocities in the left ICA are consistent with a 1-39% stenosis. Vertebrals:  Bilateral vertebral arteries demonstrate antegrade flow. Subclavians: Normal flow hemodynamics were seen in bilateral subclavian              arteries. Right ABI: Resting right ankle-brachial index is within normal range. No evidence of significant right lower extremity arterial disease. Left ABI: Resting left ankle-brachial index is within normal range. No evidence of significant left lower extremity arterial disease. Right Upper Extremity: No significant arterial obstruction detected in the right upper extremity. Left Upper Extremity: No significant arterial obstruction detected in the left upper  extremity.  Electronically signed by Gretta Began MD on 03/23/2019 at 6:27:07 PM.    Final    [place] Ekg Site Rite  Result Date: 03/28/2019 If Site Rite image not attached, placement could not be confirmed due to current cardiac rhythm.  Discharge Medications: Allergies as of 03/30/2019   No Known Allergies     Medication List    STOP taking these medications   aspirin 81 MG chewable tablet Replaced by: aspirin 81 MG EC tablet   ibuprofen 800 MG tablet Commonly known as: ADVIL   lisinopril 2.5 MG tablet Commonly known as: ZESTRIL   lovastatin 20 MG tablet Commonly known as: MEVACOR     TAKE these medications   aspirin 81 MG EC tablet Take 1 tablet (81 mg total) by mouth daily. Replaces: aspirin 81 MG chewable tablet   atorvastatin 80 MG tablet Commonly known as: LIPITOR Take 1 tablet (80 mg total) by mouth daily at 6 PM.   clopidogrel 75 MG tablet Commonly known as: PLAVIX Take 1 tablet (75 mg total) by mouth daily.   ferrous sulfate 325 (65 FE) MG tablet Take 1 tablet (325 mg total) by mouth daily.   furosemide 40 MG tablet Commonly known as: LASIX Take 1 tablet (40 mg total) by mouth daily. For 5 days then stop. Start taking on: March 31, 2019   gabapentin 300 MG capsule Commonly known as: NEURONTIN Take 300 mg by mouth 2 (two) times daily.   glipiZIDE 10 MG tablet Commonly known as: GLUCOTROL Take 10 mg by mouth 2 (two) times daily before a meal.   guaiFENesin 600 MG 12 hr tablet Commonly known as: MUCINEX Take 1 tablet (600 mg total) by mouth 2 (two) times daily.   metoprolol tartrate 25 MG tablet Commonly known as: LOPRESSOR Take 1 tablet (25 mg total) by mouth 2 (two) times daily.   nicotine 14 mg/24hr patch Commonly known as: NICODERM CQ - dosed in mg/24 hours Place 1 patch (14 mg total) onto  the skin daily.   Omega 3 1000 MG Caps Take 1,000 mg by mouth daily.   oxyCODONE 5 MG immediate release tablet Commonly known as: Oxy  IR/ROXICODONE Take 1-2 tablets (5-10 mg total) by mouth every 3 (three) hours as needed for severe pain.   potassium chloride SA 20 MEQ tablet Commonly known as: KLOR-CON Take 1 tablet (20 mEq total) by mouth daily. For 5 days then stop.   QC Vitamin D3 125 MCG (5000 UT) Tabs Generic drug: Cholecalciferol Take 1 tablet by mouth daily.   sitaGLIPtin-metformin 50-1000 MG tablet Commonly known as: JANUMET Take 1 tablet by mouth 2 (two) times daily with a meal.       The patient has been discharged on:   1.Beta Blocker:  Yes [  x ]                              No   [   ]                              If No, reason:  2.Ace Inhibitor/ARB: Yes [   ]                                     No  [  x  ]                                     If No, reason:Labile BP  3.Statin:   Yes [x   ]                  No  [   ]                  If No, reason:  4.Ecasa:  Yes  [  x ]                  No   [   ]                  If No, reason:  Follow Up Appointments: Follow-up Information    Kathleene [place], MD Follow up.   Specialty: Cardiology Why: Please call for appointment for 2 weeks after discharge from [facility] Contact information: [address] [place] [address] [phone]        Linden Dolin, MD. Call.   Specialty: Cardiothoracic Surgery Why: for a follow up 3-4 weeks after discharge from [facility]. PA/LAT CXR to be taken (at [facility] which is in the same building as Dr. Zenaida Niece Trigt's office) one hour prior to office appointment Contact information: [address] [place] [address] [place] [address] [phone]        Medical doctor Follow up.   Why: Call for a follow up appointment regarding further diabetes management and surveillance of HGA1C 7.9           Signed: Saralyn Pilar 03/30/2019, patient examined and medical record reviewed,agree with above note. Kathlee Nations Trigt III 03/30/2019

## 2019-03-28 NOTE — Progress Notes (Addendum)
STROKE TEAM PROGRESS NOTE   INTERVAL HISTORY Joan Webb is a 48 yo female sitting in the bedside chair.  Patient voices no complaints.  I have personally reviewed history of presenting illness in details.  She still has persistent right upper extremity weakness.  Vitals:   03/28/19 0800 03/28/19 0855 03/28/19 0900 03/28/19 1100  BP: 111/68  125/70   Pulse: 96 94 (!) 102   Resp:      Temp:    99.1 F (37.3 C)  TempSrc:    Oral  SpO2: 100% 99% 100%   Weight:      Height:        CBC:  Recent Labs  Lab 03/27/19 0250 03/28/19 0408  WBC 10.5 10.4  HGB 8.9* 8.5*  HCT 26.7* 25.8*  MCV 88.4 88.7  PLT 168 216    Basic Metabolic Panel:  Recent Labs  Lab 03/26/19 0304  03/26/19 1719 03/27/19 0250 03/28/19 0408  NA 136   < > 134* 133* 135  K 4.2   < > 4.4 4.1 4.1  CL 105  --  101 100 100  CO2 23  --  23 24 26   GLUCOSE 118*  --  162* 156* 115*  BUN 9  --  10 13 14   CREATININE 0.52  --  0.67 0.64 0.56  CALCIUM 8.5*  --  8.4* 8.4* 8.5*  MG 2.3  --  1.8  --   --    < > = values in this interval not displayed.   Lipid Panel:     Component Value Date/Time   CHOL 112 03/28/2019 0408   TRIG 114 03/28/2019 0408   HDL 21 (L) 03/28/2019 0408   CHOLHDL 5.3 03/28/2019 0408   VLDL 23 03/28/2019 0408   LDLCALC 68 03/28/2019 0408   HgbA1c:  Lab Results  Component Value Date   HGBA1C 7.9 (H) 03/28/2019   Urine Drug Screen: No results found for: LABOPIA, COCAINSCRNUR, LABBENZ, AMPHETMU, THCU, LABBARB  Alcohol Level No results found for: ETH  IMAGING Ct Angio Head W Or Wo Contrast  Result Date: 03/27/2019 CLINICAL DATA:  Right-sided weakness. EXAM: CT ANGIOGRAPHY HEAD AND NECK TECHNIQUE: Multidetector CT imaging of the head and neck was performed using the standard protocol during bolus administration of intravenous contrast. Multiplanar CT image reconstructions and MIPs were obtained to evaluate the vascular anatomy. Carotid stenosis measurements (when applicable) are obtained  utilizing NASCET criteria, using the distal internal carotid diameter as the denominator. CONTRAST:  49mL OMNIPAQUE IOHEXOL 350 MG/ML SOLN COMPARISON:  No pertinent prior studies available for comparison. FINDINGS: CT HEAD FINDINGS Brain: No evidence of acute intracranial hemorrhage. There is a small focus of hypodensity within the left centrum semiovale/corona radiata suspicious for acute/subacute infarct. No cortical infarct is identified. No evidence intracranial mass. No midline shift or extra-axial fluid collection. Cerebral volume is normal for age. Vascular: Reported separately Skull: Normal. Negative for fracture or focal lesion. Sinuses: No significant paranasal sinus disease or mastoid effusion. Orbits: Visualized orbits demonstrate no acute abnormality. Review of the MIP images confirms the above findings CTA NECK FINDINGS Aortic arch: Standard branching. No significant plaque within the visualized aortic arch or proximal major branch vessels of the neck. Right carotid system: CCA and ICA patent without measurable stenosis. Mild noncalcified plaque within the carotid bifurcation Left carotid system: CCA and ICA patent within the neck without measurable stenosis. No significant atherosclerotic disease. Vertebral arteries: Left vertebral artery dominant. The vertebral arteries are patent within the neck bilaterally  without significant stenosis (50% or greater. No significant atherosclerotic disease. Skeleton: Sequela of recent median sternotomy. Otherwise, no acute bony abnormality Other neck: No soft tissue neck mass or pathologically enlarged cervical chain lymph nodes. Subcentimeter calcified nodule within the left thyroid lobe not meeting consensus criteria for ultrasound follow-up. Upper chest: Sequela of recent median sternotomy. Patchy airspace opacities within the imaged lung apices. Partially imaged bilateral pleural effusions. Partially visualized right IJ approach central venous catheter Review  of the MIP images confirms the above findings CTA HEAD FINDINGS Anterior circulation: Minimal calcified plaque within the intracranial internal carotid arteries without stenosis. Motion degradation somewhat limits evaluation of the proximal middle cerebral arteries. The bilateral middle and anterior cerebral arteries are patent without significant proximal stenosis identified. No intracranial aneurysm is identified. Posterior circulation: The non dominant intracranial right vertebral artery is diminutive beyond the origin of the right PICA. The bilateral intracranial vertebral arteries are patent without significant stenosis. The basilar artery is patent without significant stenosis. The bilateral posterior cerebral arteries are patent without significant proximal stenosis. Large bilateral posterior communicating arteries are present. Venous sinuses: Within limitations of contrast timing, no convincing thrombus. Anatomic variants: None significant Review of the MIP images confirms the above findings These results were called by telephone at the time of interpretation on 03/27/2019 at 11:56 am to provider [facility] , who verbally acknowledged these results. IMPRESSION: CT head: Small focus of hypodensity within the left centrum semiovale/corona radiata suspicious for acute/subacute infarct. CTA neck: 1. The bilateral common carotid, internal carotid and vertebral arteries are patent within the neck without significant stenosis. 2. Sequela of recent median sternotomy. Patchy airspace disease within the bilateral lung apices may reflect postoperative edema. Infection not excluded. Partially imaged bilateral pleural effusions. CTA head: 1. Motion degraded examination which somewhat limits evaluation of the proximal middle cerebral arteries. 2. No intracranial large vessel occlusion or identified high-grade proximal arterial stenosis. Electronically Signed   By: Jackey Loge DO   On: 03/27/2019 12:12   Ct Angio Neck  W Or Wo Contrast  Result Date: 03/27/2019 CLINICAL DATA:  Right-sided weakness. EXAM: CT ANGIOGRAPHY HEAD AND NECK TECHNIQUE: Multidetector CT imaging of the head and neck was performed using the standard protocol during bolus administration of intravenous contrast. Multiplanar CT image reconstructions and MIPs were obtained to evaluate the vascular anatomy. Carotid stenosis measurements (when applicable) are obtained utilizing NASCET criteria, using the distal internal carotid diameter as the denominator. CONTRAST:  36mL OMNIPAQUE IOHEXOL 350 MG/ML SOLN COMPARISON:  No pertinent prior studies available for comparison. FINDINGS: CT HEAD FINDINGS Brain: No evidence of acute intracranial hemorrhage. There is a small focus of hypodensity within the left centrum semiovale/corona radiata suspicious for acute/subacute infarct. No cortical infarct is identified. No evidence intracranial mass. No midline shift or extra-axial fluid collection. Cerebral volume is normal for age. Vascular: Reported separately Skull: Normal. Negative for fracture or focal lesion. Sinuses: No significant paranasal sinus disease or mastoid effusion. Orbits: Visualized orbits demonstrate no acute abnormality. Review of the MIP images confirms the above findings CTA NECK FINDINGS Aortic arch: Standard branching. No significant plaque within the visualized aortic arch or proximal major branch vessels of the neck. Right carotid system: CCA and ICA patent without measurable stenosis. Mild noncalcified plaque within the carotid bifurcation Left carotid system: CCA and ICA patent within the neck without measurable stenosis. No significant atherosclerotic disease. Vertebral arteries: Left vertebral artery dominant. The vertebral arteries are patent within the neck bilaterally without significant stenosis (50% or  greater. No significant atherosclerotic disease. Skeleton: Sequela of recent median sternotomy. Otherwise, no acute bony abnormality Other  neck: No soft tissue neck mass or pathologically enlarged cervical chain lymph nodes. Subcentimeter calcified nodule within the left thyroid lobe not meeting consensus criteria for ultrasound follow-up. Upper chest: Sequela of recent median sternotomy. Patchy airspace opacities within the imaged lung apices. Partially imaged bilateral pleural effusions. Partially visualized right IJ approach central venous catheter Review of the MIP images confirms the above findings CTA HEAD FINDINGS Anterior circulation: Minimal calcified plaque within the intracranial internal carotid arteries without stenosis. Motion degradation somewhat limits evaluation of the proximal middle cerebral arteries. The bilateral middle and anterior cerebral arteries are patent without significant proximal stenosis identified. No intracranial aneurysm is identified. Posterior circulation: The non dominant intracranial right vertebral artery is diminutive beyond the origin of the right PICA. The bilateral intracranial vertebral arteries are patent without significant stenosis. The basilar artery is patent without significant stenosis. The bilateral posterior cerebral arteries are patent without significant proximal stenosis. Large bilateral posterior communicating arteries are present. Venous sinuses: Within limitations of contrast timing, no convincing thrombus. Anatomic variants: None significant Review of the MIP images confirms the above findings These results were called by telephone at the time of interpretation on 03/27/2019 at 11:56 am to [facility] , who verbally acknowledged these results. IMPRESSION: CT head: Small focus of hypodensity within the left centrum semiovale/corona radiata suspicious for acute/subacute infarct. CTA neck: 1. The bilateral common carotid, internal carotid and vertebral arteries are patent within the neck without significant stenosis. 2. Sequela of recent median sternotomy. Patchy airspace disease within  the bilateral lung apices may reflect postoperative edema. Infection not excluded. Partially imaged bilateral pleural effusions. CTA head: 1. Motion degraded examination which somewhat limits evaluation of the proximal middle cerebral arteries. 2. No intracranial large vessel occlusion or identified high-grade proximal arterial stenosis. Electronically Signed   By: Jackey LogeKyle  Golden DO   On: 03/27/2019 12:12   Dg Chest Port 1 View  Result Date: 03/27/2019 CLINICAL DATA:  (coronary artery bypass graft) Update status EXAM: PORTABLE CHEST 1 VIEW COMPARISON:  Chest radiograph 03/26/2019, 03/25/2019 FINDINGS: Stable cardiomediastinal contours status post median sternotomy and CABG. Interval removal of a left chest tube and retraction of the PA catheter. There are increased bilateral diffuse interstitial opacities most likely representing pulmonary edema. There is persistent consolidation at the left base likely representing atelectasis and small volume pleural fluid. No evidence of pneumothorax. IMPRESSION: 1. Increased bilateral interstitial opacities, most likely representing pulmonary edema. 2. Persistent consolidation at the left base likely a combination of atelectasis and small volume pleural fluid. Electronically Signed   By: Emmaline KluverNancy  Ballantyne M.D.   On: 03/27/2019 13:47   [place] Ekg Site Rite  Result Date: 03/28/2019 If Site Rite image not attached, placement could not be confirmed due to current cardiac rhythm.   PHYSICAL EXAM  Pleasant middle-aged lady sitting up comfortably in bed.  Not in distress. . Afebrile. Head is nontraumatic. Neck is supple without bruit.    Cardiac exam no murmur or gallop. Lungs are clear to auscultation. Distal pulses are well felt. Neurological Exam : She is awake alert she is oriented to time place and person.  Speech is without aphasia apraxia or dysarthria.  Extraocular movements are full range without nystagmus.  She blinks to threat bilaterally.  Mild right lower facial  weakness.  Tongue midline.  Motor system exam shows right hemiplegia with significant right upper extremity greater than lower  extremity weakness.  She has some antigravity strength at the right shoulder and has 2/5 strength in the triceps with 0/4 in the biceps as well as right wrist and hand.  She does have some antigravity strength in the right lower extremity but it is weaker compared to the left.  Deep tendon flexes are symmetric.  Plantars downgoing.  Gait not tested. ASSESSMENT/PLAN Ms. Mercer Podari Mckiernan is a 48 y.o. female with history of coronary artery disease status post coronary artery bypass graft postop day 2, anxiety, diabetes, hyperlipidemia, tobacco abuse, admitted in the cardiac ICU after coronary artery bypass grafting done 2 days ago started complaining of right arm weakness and numbness in both lower extremities. She could not give me the exact time and she is still not oriented to time.  She said that this had started to happen 2 to 3 days ago when she had numbness and weakness of her right arm. She reports having some neuropathy at baseline but not weakness and inability to close her fist and lift her arm.  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 CT head 03/27/2019-Small focus of hypodensity within the left centrum semiovale/corona  radiata suspicious for acute/subacute infarct  CTA neck 11/8/2020The bilateral common carotid, internal carotid and vertebral arteries are patent within the neck without significant stenosis  CTA head11/12/2018 no large vessel occlusion  MRI  Unable to obtain due to postsurgical staples  Carotid Doppler  03/23/2019 Right Carotid: Velocities in the right ICA are consistent with a 1-39% stenosis. Left Carotid: Velocities in the left ICA are consistent with a 1-39% stenosis. Vertebrals:  Bilateral vertebral arteries demonstrate antegrade flow. Subclavians: Normal flow hemodynamics were seen in  bilateral subclavian arteries.  2D Echo 03/23/2019 - 1. Left ventricular ejection fraction, by visual estimation, is 60 to 65%. The left ventricle has normal function. There is no left ventricular hypertrophy.  2. Left ventricular diastolic parameters are indeterminate.  3. Global right ventricle has normal systolic function.The right ventricular size is normal. No increase in right ventricular wall thickness.  4. Left atrial size was normal.  5. Right atrial size was normal.  6. The mitral valve is normal in structure. No evidence of mitral valve regurgitation. No evidence of mitral stenosis.  7. The tricuspid valve is normal in structure. Tricuspid valve regurgitation is not demonstrated.  8. The aortic valve is tricuspid. Aortic valve regurgitation is not visualized. Mild aortic valve sclerosis without stenosis.  9. The pulmonic valve was normal in structure. Pulmonic valve regurgitation is not visualized. 10. TR signal is inadequate for assessing pulmonary artery systolic pressure. 11. The inferior vena cava is normal in size with <50% respiratory variability, suggesting right atrial pressure of 8 mmHg.  LDL 68  HgbA1c 7.9  Lovenox 40 mg BID, SCD'sfor VTE prophylaxis    Diet   Diet NPO time specified Except for: Ice Chips, Sips with Meds     aspirin 81 mg daily prior to admission, now on aspirin 325 mg daily. Will need to begin dual antiplatelet therapy with Plavix when OK with cardiac surgery (DAPT for 3 weeks then ASA alone)  Therapy recommendations:   -Speech therapy inpatient rehab - PT recommending CIR -OT rec pending  Disposition:  CIR  Hypertension  Home meds:  Lisinopril 5mg  daily . Permissive hypertension (OK if < 220/120) but gradually normalize in 5-7 days . Long-term BP goal normotensive  Hyperlipidemia  Home meds: lovastatin 20 mg daily, lipitor 80 mg daily in  hospital  LDL 68, goal < 70  Continue statin at discharge  Diabetes type II  UnControlled  Home meds:  Glipizide 10mg  BID and Janumet 50-1000mg  BId  HgbA1c 7.9, goal < 7.0  CBGs Q 4 hours   SSI  Other Stroke Risk Factors  Cigarette smoker --advised to stop smoking  ETOH advised to drink no more than 1 drink a day  Obesity, Body mass index is 32.98 kg/m., recommend weight loss, diet and exercise as appropriate   Coronary artery disease s/p CABG this admissio  Neurology will sign off, please Call with any questions or concerns  Hospital day # 5  Beulah Gandy, NP  I have personally obtained history,examined this patient, reviewed notes, independently viewed imaging studies, participated in medical decision making and plan of care.ROS completed by me personally and pertinent positives fully documented  I have made any additions or clarifications directly to the above note.  Patient underwent cardiac bypass surgery and 2 days postop notice right arm and face weakness due to left brain subcortical infarct likely in the perioperative..  Recommend dual antiplatelet therapy of aspirin and Plavix for 3 weeks if okay from cardiac surgical standpoint otherwise aspirin alone for life.  Maintain aggressive risk factor modification.  Patient counseled to quit smoking, eat a healthy diet, exercise regularly and lose weight.  Keep systolic blood pressure below 140 and LDL cholesterol below 70 mg percent and hemoglobin A1c below 6.5.  Discussed with patient and answered questions.  Greater than 50% time during the 25-minute visit was spent on counseling and coordination of care about stroke and discussion about stroke prevention.  Continue ongoing physical occupational therapy and she will likely need inpatient rehab.  Follow-up as an outpatient stroke clinic in 6 weeks stroke team will sign off.  Kindly call for questions.  Antony Contras, MD Medical Director [facility] Pager: [phone] 03/28/2019 2:12 PM  To contact Stroke Continuity provider, please refer to  [email or web address] After hours, contact General Neurology

## 2019-03-28 NOTE — Progress Notes (Signed)
Rehab Admissions Coordinator Note:  Patient was screened by Cleatrice Burke for appropriateness for an Inpatient Acute Rehab Consult per PT recs. At this time, we are recommending Inpatient Rehab consult. Please place order if pt would like to be considered for admit.  Cleatrice Burke RN MSN 03/28/2019, 9:47 AM  I can be reached at [phone].

## 2019-03-28 NOTE — Addendum Note (Signed)
Addendum  created 03/28/19 0814 by Josephine Igo, CRNA   Order list changed

## 2019-03-28 NOTE — Addendum Note (Signed)
Addendum  created 03/28/19 1340 by Duane Boston, MD   Intraprocedure Staff edited

## 2019-03-29 ENCOUNTER — Inpatient Hospital Stay (HOSPITAL_COMMUNITY): Payer: Medicaid Other

## 2019-03-29 LAB — GLUCOSE, CAPILLARY
Glucose-Capillary: 128 mg/dL — ABNORMAL HIGH (ref 70–99)
Glucose-Capillary: 173 mg/dL — ABNORMAL HIGH (ref 70–99)
Glucose-Capillary: 215 mg/dL — ABNORMAL HIGH (ref 70–99)
Glucose-Capillary: 161 mg/dL — ABNORMAL HIGH (ref 70–99)
Glucose-Capillary: 173 mg/dL — ABNORMAL HIGH (ref 70–99)
Glucose-Capillary: 146 mg/dL — ABNORMAL HIGH (ref 70–99)

## 2019-03-29 LAB — COOXEMETRY PANEL
Carboxyhemoglobin: 1.3 % (ref 0.5–1.5)
Carboxyhemoglobin: 1.2 % (ref 0.5–1.5)
Methemoglobin: 1 % (ref 0.0–1.5)
Methemoglobin: 0.9 % (ref 0.0–1.5)
O2 Saturation: 50.2 %
O2 Saturation: 60.3 %
Total hemoglobin: 11.3 g/dL — ABNORMAL LOW (ref 12.0–16.0)
Total hemoglobin: 14.2 g/dL (ref 12.0–16.0)

## 2019-03-29 LAB — CBC
HCT: 25.9 % — ABNORMAL LOW (ref 36.0–46.0)
Hemoglobin: 8.5 g/dL — ABNORMAL LOW (ref 12.0–15.0)
MCH: 29.1 pg (ref 26.0–34.0)
MCHC: 32.8 g/dL (ref 30.0–36.0)
MCV: 88.7 fL (ref 80.0–100.0)
Platelets: 266 10*3/uL (ref 150–400)
RBC: 2.92 MIL/uL — ABNORMAL LOW (ref 3.87–5.11)
RDW: 13 % (ref 11.5–15.5)
WBC: 9.3 10*3/uL (ref 4.0–10.5)
nRBC: 0 % (ref 0.0–0.2)

## 2019-03-29 LAB — BASIC METABOLIC PANEL
Anion gap: 10 (ref 5–15)
BUN: 17 mg/dL (ref 6–20)
CO2: 27 mmol/L (ref 22–32)
Calcium: 8.5 mg/dL — ABNORMAL LOW (ref 8.9–10.3)
Chloride: 98 mmol/L (ref 98–111)
Creatinine, Ser: 0.54 mg/dL (ref 0.44–1.00)
GFR calc Af Amer: >60 mL/min (ref >60)
GFR calc non Af Amer: >60 mL/min (ref >60)
Glucose, Bld: 146 mg/dL — ABNORMAL HIGH (ref 70–99)
Potassium: 3.9 mmol/L (ref 3.5–5.1)
Sodium: 135 mmol/L (ref 135–145)

## 2019-03-29 MED ORDER — METOPROLOL TARTRATE 25 MG PO TABS
25.0000 mg | ORAL_TABLET | Freq: Two times a day (BID) | ORAL | Status: DC
  Administered 2019-03-29 – 2019-03-30 (×3): 25 mg via ORAL
  Filled 2019-03-29 (×3): qty 1

## 2019-03-29 MED ORDER — CHLORHEXIDINE GLUCONATE 0.12 % MT SOLN
OROMUCOSAL | Status: AC
  Administered 2019-03-29: 15 mL
  Filled 2019-03-29: qty 15

## 2019-03-29 MED ORDER — MILRINONE LACTATE IN DEXTROSE 20-5 MG/100ML-% IV SOLN
0.1250 ug/kg/min | INTRAVENOUS | Status: AC
  Administered 2019-03-29: 13:00:00 0.125 ug/kg/min via INTRAVENOUS

## 2019-03-29 MED ORDER — METOPROLOL TARTRATE 25 MG/10 ML ORAL SUSPENSION: 12.5000 mg | Freq: Two times a day (BID) | ORAL | Status: DC

## 2019-03-29 MED ORDER — INSULIN ASPART 100 UNIT/ML ~~LOC~~ SOLN: 0.0000 [IU] | Freq: Every day | SUBCUTANEOUS | Status: DC

## 2019-03-29 MED ORDER — MILRINONE LACTATE IN DEXTROSE 20-5 MG/100ML-% IV SOLN: 0.1250 ug/kg/min | INTRAVENOUS | Status: DC

## 2019-03-29 MED ORDER — ORAL CARE MOUTH RINSE
15.0000 mL | Freq: Two times a day (BID) | OROMUCOSAL | Status: DC
  Administered 2019-03-29: 23:00:00 15 mL via OROMUCOSAL

## 2019-03-29 MED ORDER — SORBITOL 70 % SOLN
50.0000 mL | Freq: Once | Status: AC
  Administered 2019-03-29: 50 mL via ORAL
  Filled 2019-03-29: qty 60

## 2019-03-29 MED ORDER — INSULIN ASPART 100 UNIT/ML ~~LOC~~ SOLN
0.0000 [IU] | Freq: Three times a day (TID) | SUBCUTANEOUS | Status: DC
  Administered 2019-03-29: 3 [IU] via SUBCUTANEOUS
  Administered 2019-03-29: 12:00:00 5 [IU] via SUBCUTANEOUS
  Administered 2019-03-30 (×2): 3 [IU] via SUBCUTANEOUS

## 2019-03-29 MED FILL — Magnesium Sulfate Inj 50%: INTRAMUSCULAR | Qty: 10 | Status: AC

## 2019-03-29 MED FILL — Lidocaine HCl Local Soln Prefilled Syringe 100 MG/5ML (2%): INTRAMUSCULAR | Qty: 5 | Status: AC

## 2019-03-29 MED FILL — Potassium Chloride Inj 2 mEq/ML: INTRAVENOUS | Qty: 40 | Status: AC

## 2019-03-29 MED FILL — Heparin Sodium (Porcine) Inj 1000 Unit/ML: INTRAMUSCULAR | Qty: 10 | Status: AC

## 2019-03-29 MED FILL — Mannitol IV Soln 20%: INTRAVENOUS | Qty: 500 | Status: AC

## 2019-03-29 MED FILL — Electrolyte-R (PH 7.4) Solution: INTRAVENOUS | Qty: 3000 | Status: AC

## 2019-03-29 MED FILL — Sodium Bicarbonate IV Soln 8.4%: INTRAVENOUS | Qty: 50 | Status: AC

## 2019-03-29 MED FILL — Sodium Chloride IV Soln 0.9%: INTRAVENOUS | Qty: 2000 | Status: AC

## 2019-03-29 MED FILL — Heparin Sodium (Porcine) Inj 1000 Unit/ML: INTRAMUSCULAR | Qty: 30 | Status: AC

## 2019-03-29 NOTE — Progress Notes (Signed)
4 Days Post-Op Procedure(s) (LRB): CORONARY ARTERY BYPASS GRAFTING (CABG) X THREE, ON PUMP, USING LEFT INTERNAL MAMMARY ARTERY AND LEFT GREATER SAPHENOUS VEIN HARVESTED ENDOSCOPICALLY (N/A) TRANSESOPHAGEAL ECHOCARDIOGRAM (TEE) (N/A) Subjective: Sinus rhythm with good cardiac output-coox 65% Patient walked 40 feet with assistance by physical therapy Objective: Vital signs in last 24 hours: Temp:  [98.4 F (36.9 C)-99.6 F (37.6 C)] 98.4 F (36.9 C) (11/10 0700) Pulse Rate:  [85-102] 96 (11/10 1100) Cardiac Rhythm: Normal sinus rhythm (11/10 0800) BP: (85-165)/(46-96) 130/67 (11/10 1100) SpO2:  [88 %-99 %] 97 % (11/10 0800)  Hemodynamic parameters for last 24 hours:  Sinus rhythm  Intake/Output from previous day: 11/09 0701 - 11/10 0700 In: 541.9 [P.O.:360; I.V.:181.9] Out: 2725 [Urine:2725] Intake/Output this shift: Total I/O In: 12.1 [I.V.:12.1] Out: 900 [Urine:900]       Exam    General- alert and comfortable    Neck- no JVD, no cervical adenopathy palpable, no carotid bruit   Lungs- clear without rales, wheezes   Cor- regular rate and rhythm, no murmur , gallop   Abdomen- soft, non-tender   Extremities - warm, non-tender, minimal edema   Neuro- oriented, appropriate, no focal weakness   Lab Results: Recent Labs    03/28/19 0408 03/29/19 0338  WBC 10.4 9.3  HGB 8.5* 8.5*  HCT 25.8* 25.9*  PLT 216 266   BMET:  Recent Labs    03/28/19 0408 03/29/19 0338  NA 135 135  K 4.1 3.9  CL 100 98  CO2 26 27  GLUCOSE 115* 146*  BUN 14 17  CREATININE 0.56 0.54  CALCIUM 8.5* 8.5*    PT/INR: No results for input(s): LABPROT, INR in the last 72 hours. ABG    Component Value Date/Time   PHART 7.358 03/26/2019 0431   HCO3 21.5 03/26/2019 0431   TCO2 23 03/26/2019 0431   ACIDBASEDEF 3.0 (H) 03/26/2019 0431   O2SAT 60.3 03/29/2019 0530   CBG (last 3)  Recent Labs    03/28/19 2336 03/29/19 0356 03/29/19 0703  GLUCAP 88 128* 173*    Assessment/Plan: S/P  Procedure(s) (LRB): CORONARY ARTERY BYPASS GRAFTING (CABG) X THREE, ON PUMP, USING LEFT INTERNAL MAMMARY ARTERY AND LEFT GREATER SAPHENOUS VEIN HARVESTED ENDOSCOPICALLY (N/A) TRANSESOPHAGEAL ECHOCARDIOGRAM (TEE) (N/A) Preoperative MI with moderate LV dysfunction Poorly controlled diabetes mellitus Status post CABG x3 Postoperative CVA with right upper extremity weakness, unsteady gait-CTA without evidence of large vessel disease  Plan continue postoperative rehabilitation Plan removal of epicardial wires tomorrow then patient will be started on dual antiplatelet therapy with 81 aspirin and Plavix   LOS: 6 days    Joan Webb 03/29/2019

## 2019-03-29 NOTE — Progress Notes (Signed)
TCTS Evening Rounds  Vitals:   03/29/19 1645 03/29/19 1700  BP: 123/64 133/67  Pulse: 86 86  Resp:    Temp:    SpO2: 97% 97%    Intake/Output Summary (Last 24 hours) at 03/29/2019 1721 Last data filed at 03/29/2019 1700 Gross per 24 hour  Intake 792.41 ml  Output 2600 ml  Net -1807.59 ml   POD #4 s/p CABG  Doing better but residual rue weakness. CIR consult. Colette Dicamillo Z. Orvan Seen, [place]

## 2019-03-29 NOTE — H&P (Addendum)
Physical Medicine and Rehabilitation Admission H&P     HPI: Joan Webb is a 48 year old right-handed female with history of diabetes mellitus with intermittent numbness in bilateral hands, tobacco abuse and hyperlipidemia as well as morbid obesity with BMI 32.98.  History taken from chart review, ex-husband, and patient.  Patient lives alone.  Independent prior to admission 1 level home with 2 steps to entry.  She does have children in the area who plan to provide assistance on discharge as well as an ex-husband.  Presented 03/23/2019 with chest pain.  ECG performed showed inferior STEMI. Code STEMI was activated.  Noted sodium 131, glucose 338, troponin 24, WBC 14,300, SARS Covid negative.  Chest x-ray negative.  She was taken to the Cath Lab demonstrating severe multivessel coronary artery disease.  Underwent CABG x3 on 03/25/2019 per Dr. Darcey Nora.  Hospital course complicated by pain and acute blood loss anemia of 8.3.  Sternal precautions as indicated.  On 03/27/2019 patient was found to have right arm weakness and numbness.  Cranial CT scan showed small focus of hypodensity within the left centrum semiovale/corona radiata.  CTA of the neck bilateral common carotid, internal carotid and vertebral arteries patent within the neck without significant stenosis.  CTA of head with no large vessel occlusion.  MRI was not able to be performed due to postsurgical staples.  Carotid Dopplers 1 to 39% stenosis.  Echocardiogram showed ejection fraction of 65%.  Patient's diet was slowly advanced to a regular consistency.  Epicardial wires removed 03/30/2019 and follow-up chest x-ray completed showing progressive bilateral pulmonary infiltrates/edema.  Progressive bibasilar atelectasis.  Patient remained on Lasix therapy as directed.. Neurology recommended patient be on ASA 81 and Plavix 75 x 3-week, then ASA alone.  Tolerating a regular consistency diet.  Therapy evaluations completed and patient was admitted for a  comprehensive rehab program  Review of Systems  Constitutional: Negative for chills and fever.  HENT: Negative for hearing loss.   Eyes: Negative for blurred vision and double vision.  Respiratory: Positive for shortness of breath. Negative for cough.   Cardiovascular: Positive for chest pain, palpitations and leg swelling.  Gastrointestinal: Positive for constipation. Negative for heartburn, nausea and vomiting.  Genitourinary: Negative for dysuria, flank pain and hematuria.  Musculoskeletal: Positive for myalgias.  Skin: Negative for rash.  Neurological: Positive for sensory change and focal weakness. Negative for speech change.   Past Medical History:  Diagnosis Date   Anxiety    Coronary artery disease    Diabetes ([place])    Hyperlipidemia    Myocardial infarction ([place])    Tobacco abuse    Past Surgical History:  Procedure Laterality Date   CARDIAC CATHETERIZATION     CORONARY ARTERY BYPASS GRAFT N/A 03/25/2019   Procedure: CORONARY ARTERY BYPASS GRAFTING (CABG) X THREE, ON PUMP, USING LEFT INTERNAL MAMMARY ARTERY AND LEFT GREATER SAPHENOUS VEIN HARVESTED ENDOSCOPICALLY;  Surgeon: Ivin Poot, MD;  Location: [facility];  Service: Open Heart Surgery;  Laterality: N/A;   LEFT HEART CATH AND CORONARY ANGIOGRAPHY N/A 03/23/2019   Procedure: LEFT HEART CATH AND CORONARY ANGIOGRAPHY;  Surgeon: Burnell Blanks, MD;  Location: [facility];  Service: Cardiovascular;  Laterality: N/A;   None     TEE WITHOUT CARDIOVERSION N/A 03/25/2019   Procedure: TRANSESOPHAGEAL ECHOCARDIOGRAM (TEE);  Surgeon: Prescott Gum, Collier Salina, MD;  Location: [facility];  Service: Open Heart Surgery;  Laterality: N/A;   Family History  Problem Relation Age of Onset   Heart attack Father  Social History:  reports that she has been smoking cigarettes. She has never used smokeless tobacco. She reports previous alcohol use. She reports previous drug use. Allergies: No Known Allergies Medications  Prior to Admission  Medication Sig Dispense Refill   aspirin 81 MG chewable tablet Chew 81 mg by mouth daily.     Cholecalciferol (QC VITAMIN D3) 125 MCG (5000 UT) TABS Take 1 tablet by mouth daily.     gabapentin (NEURONTIN) 300 MG capsule Take 300 mg by mouth 2 (two) times daily.     glipiZIDE (GLUCOTROL) 10 MG tablet Take 10 mg by mouth 2 (two) times daily before a meal.      ibuprofen (ADVIL) 800 MG tablet Take 800 mg by mouth 2 (two) times daily as needed for mild pain.     lisinopril (ZESTRIL) 2.5 MG tablet Take 5 mg by mouth daily.      lovastatin (MEVACOR) 20 MG tablet Take 20 mg by mouth at bedtime.      Omega 3 1000 MG CAPS Take 1,000 mg by mouth daily.     sitaGLIPtin-metformin (JANUMET) 50-1000 MG tablet Take 1 tablet by mouth 2 (two) times daily with a meal.      Drug Regimen Review Drug regimen was reviewed and remains appropriate with no significant issues identified  Home: Home Living Family/patient expects to be discharged to:: Private residence Living Arrangements: Alone(but plans to live with children at dc) Available Help at Discharge: Family, Available PRN/intermittently Type of Home: House Home Access: Stairs to enter Technical brewer of Steps: 2 Entrance Stairs-Rails: Can reach both Home Layout: One level Bathroom Shower/Tub: Chiropodist: Standard  Lives With: Alone   Functional History: Prior Function Level of Independence: Independent  Functional Status:  Mobility: Bed Mobility Overal bed mobility: (pt up in recliner upon arrival) Bed Mobility: Rolling, Sidelying to Sit Rolling: Min assist Sidelying to sit: Mod assist General bed mobility comments: up in chair Transfers Overall transfer level: Needs assistance Equipment used: Rolling walker (2 wheeled) Transfers: Sit to/from Stand Sit to Stand: Min guard Stand pivot transfers: Mod assist General transfer comment: mod assist progressing to min assist during  transfers, cueing for sternal precautions and technique  Ambulation/Gait Ambulation/Gait assistance: Min assist Gait Distance (Feet): 40 Feet(additional trials of 15 and 25') Assistive device: Rolling walker (2 wheeled) Gait Pattern/deviations: Step-to pattern, Decreased step length - right, Decreased step length - left, Decreased dorsiflexion - right General Gait Details: pt with intermittent R knee hyperextension during stance phase, one instance of R knee buckling. Pt with short and shuffling steps Gait velocity: decreased Gait velocity interpretation: <1.8 ft/sec, indicate of risk for recurrent falls    ADL: ADL Overall ADL's : Needs assistance/impaired Grooming: Moderate assistance, Sitting Upper Body Bathing: Sitting, Moderate assistance Lower Body Bathing: +2 for physical assistance, +2 for safety/equipment, Maximal assistance, Sit to/from stand Upper Body Dressing : Maximal assistance, Sitting Lower Body Dressing: Maximal assistance, +2 for physical assistance, Sit to/from stand Toilet Transfer: Moderate assistance, Minimal assistance, +2 for safety/equipment, Ambulation Functional mobility during ADLs: Moderate assistance, Minimal assistance, Cueing for safety, Cueing for sequencing General ADL Comments: pt limited by cognition, R hemi, sternal precautions and decreased activity tolerance  Cognition: Cognition Overall Cognitive Status: Impaired/Different from baseline Arousal/Alertness: Awake/alert Orientation Level: Oriented X4 Attention: Sustained Sustained Attention: Appears intact Memory: Impaired Memory Impairment: Retrieval deficit Immediate Memory Recall: Sheila Oats, Bed Memory Recall Sock: Not able to recall Memory Recall Blue: Not able to recall Memory Recall Bed: Not  able to recall Awareness: Appears intact Problem Solving: Impaired Problem Solving Impairment: Verbal complex Executive Function: Writer: Impaired Organizing Impairment: Verbal  complex Safety/Judgment: Appears intact Cognition Arousal/Alertness: Awake/alert Behavior During Therapy: WFL for tasks assessed/performed Overall Cognitive Status: Impaired/Different from baseline Area of Impairment: Safety/judgement, Awareness, Problem solving Current Attention Level: Sustained Safety/Judgement: Decreased awareness of safety, Decreased awareness of deficits Awareness: Emergent Problem Solving: Slow processing, Difficulty sequencing, Requires verbal cues General Comments: impulsive  Physical Exam: Blood pressure (!) 110/58, pulse 74, temperature 98.1 F (36.7 C), temperature source Axillary, resp. rate 13, height _0  (1.575 m), weight 81.8 kg, SpO2 93 %. Physical Exam  Vitals reviewed. Constitutional: She appears well-developed.  Obese  Eyes: EOM are normal. Right eye exhibits no discharge. Left eye exhibits no discharge.  Neck: No tracheal deviation present. No thyromegaly present.  Respiratory: Effort normal. No respiratory distress.  GI: She exhibits no distension.  Musculoskeletal:     Comments: Left hand edema No tenderness.  Neurological: She is alert.  Makes good eye contact.   Follows commands.   Oriented x3  She is a bit easily distracted. Motor: Left upper extremity/left lower extremity: 5/5 proximal distal Right upper extremity: Shoulder abduction elbow flexion/extension 4/5, wrist extension, handgrip 0/5 Right lower extremity: 4/5 proximal to distal  Skin:  Midline chest incision C/D/I  Psychiatric: Her mood appears anxious. Her speech is tangential.    Results for orders placed or performed during the hospital encounter of 03/23/19 (from the past 48 hour(s))  Glucose, capillary     Status: Abnormal   Collection Time: 03/28/19  7:34 AM  Result Value Ref Range   Glucose-Capillary 125 (H) 70 - 99 mg/dL  .Cooxemetry Panel (carboxy, met, total hgb, O2 sat)     Status: Abnormal   Collection Time: 03/28/19  8:20 AM  Result Value Ref Range    Total hemoglobin 9.0 (L) 12.0 - 16.0 g/dL   O2 Saturation 70.2 %   Carboxyhemoglobin 1.2 0.5 - 1.5 %   Methemoglobin 0.8 0.0 - 1.5 %    Comment: Performed at [facility], [place] [address] [place], [place] [address]  Glucose, capillary     Status: Abnormal   Collection Time: 03/28/19 11:19 AM  Result Value Ref Range   Glucose-Capillary 150 (H) 70 - 99 mg/dL  Glucose, capillary     Status: Abnormal   Collection Time: 03/28/19  3:27 PM  Result Value Ref Range   Glucose-Capillary 121 (H) 70 - 99 mg/dL  Glucose, capillary     Status: Abnormal   Collection Time: 03/28/19  7:43 PM  Result Value Ref Range   Glucose-Capillary 172 (H) 70 - 99 mg/dL  Glucose, capillary     Status: None   Collection Time: 03/28/19 11:36 PM  Result Value Ref Range   Glucose-Capillary 88 70 - 99 mg/dL  CBC     Status: Abnormal   Collection Time: 03/29/19  3:38 AM  Result Value Ref Range   WBC 9.3 4.0 - 10.5 K/uL   RBC 2.92 (L) 3.87 - 5.11 MIL/uL   Hemoglobin 8.5 (L) 12.0 - 15.0 g/dL   HCT 25.9 (L) 36.0 - 46.0 %   MCV 88.7 80.0 - 100.0 fL   MCH 29.1 26.0 - 34.0 pg   MCHC 32.8 30.0 - 36.0 g/dL   RDW 13.0 11.5 - 15.5 %   Platelets 266 150 - 400 K/uL   nRBC 0.0 0.0 - 0.2 %    Comment: Performed at [facility], [place] [address] [place], [place] [address]  Basic metabolic panel     Status: Abnormal   Collection Time: 03/29/19  3:38 AM  Result Value Ref Range   Sodium 135 135 - 145 mmol/L   Potassium 3.9 3.5 - 5.1 mmol/L   Chloride 98 98 - 111 mmol/L   CO2 27 22 - 32 mmol/L   Glucose, Bld 146 (H) 70 - 99 mg/dL   BUN 17 6 - 20 mg/dL   Creatinine, Ser 0.54 0.44 - 1.00 mg/dL   Calcium 8.5 (L) 8.9 - 10.3 mg/dL   GFR calc non Af Amer >60 >60 mL/min   GFR calc Af Amer >60 >60 mL/min   Anion gap 10 5 - 15    Comment: Performed at [facility], [place] [address], [place], [place] [address]  .Cooxemetry Panel (carboxy, met, total hgb, O2 sat)     Status: Abnormal   Collection Time: 03/29/19  3:50  AM  Result Value Ref Range   Total hemoglobin 11.3 (L) 12.0 - 16.0 g/dL   O2 Saturation 50.2 %   Carboxyhemoglobin 1.3 0.5 - 1.5 %   Methemoglobin 1.0 0.0 - 1.5 %    Comment: Performed at [facility], [place] [address], [place], [place] [address]  Glucose, capillary     Status: Abnormal   Collection Time: 03/29/19  3:56 AM  Result Value Ref Range   Glucose-Capillary 128 (H) 70 - 99 mg/dL  .Cooxemetry Panel (carboxy, met, total hgb, O2 sat)     Status: None   Collection Time: 03/29/19  5:30 AM  Result Value Ref Range   Total hemoglobin 14.2 12.0 - 16.0 g/dL   O2 Saturation 60.3 %   Carboxyhemoglobin 1.2 0.5 - 1.5 %   Methemoglobin 0.9 0.0 - 1.5 %    Comment: Performed at [facility], [place] [address] [place], [place] [address]  Glucose, capillary     Status: Abnormal   Collection Time: 03/29/19  7:03 AM  Result Value Ref Range   Glucose-Capillary 173 (H) 70 - 99 mg/dL  Glucose, capillary     Status: Abnormal   Collection Time: 03/29/19 12:04 PM  Result Value Ref Range   Glucose-Capillary 215 (H) 70 - 99 mg/dL  Glucose, capillary     Status: Abnormal   Collection Time: 03/29/19  3:50 PM  Result Value Ref Range   Glucose-Capillary 161 (H) 70 - 99 mg/dL  Glucose, capillary     Status: Abnormal   Collection Time: 03/29/19  8:11 PM  Result Value Ref Range   Glucose-Capillary 173 (H) 70 - 99 mg/dL  Glucose, capillary     Status: Abnormal   Collection Time: 03/29/19 10:20 PM  Result Value Ref Range   Glucose-Capillary 146 (H) 70 - 99 mg/dL  CBC     Status: Abnormal   Collection Time: 03/30/19  5:18 AM  Result Value Ref Range   WBC 7.7 4.0 - 10.5 K/uL   RBC 2.86 (L) 3.87 - 5.11 MIL/uL   Hemoglobin 8.3 (L) 12.0 - 15.0 g/dL   HCT 25.2 (L) 36.0 - 46.0 %   MCV 88.1 80.0 - 100.0 fL   MCH 29.0 26.0 - 34.0 pg   MCHC 32.9 30.0 - 36.0 g/dL   RDW 12.9 11.5 - 15.5 %   Platelets 303 150 - 400 K/uL   nRBC 0.3 (H) 0.0 - 0.2 %    Comment: Performed at [facility],  [place]  [address], [place], [place] [address]   Dg Chest Port 1 View  Result Date: 03/29/2019 CLINICAL DATA:  CABG.  Sore chest. EXAM: PORTABLE CHEST 1 VIEW COMPARISON:  03/27/2019. FINDINGS: Interim removal of right IJ sheath. Left PICC line noted with tip over cavoatrial junction. Epicardial pacer wire noted over the left chest base. Prior CABG. Cardiomegaly. Bilateral pulmonary infiltrates/edema progressed from prior exam. Progression of bibasilar atelectasis. Small bilateral pleural effusions cannot be excluded. No pneumothorax. IMPRESSION: 1. Interim removal of right IJ sheath. Left PICC line noted with tip over cavoatrial junction. 2.  Prior CABG.  Cardiomegaly again noted. 3. Progressive bilateral pulmonary infiltrates/edema. Progressive bibasilar atelectasis. Small bilateral pleural effusions cannot be excluded. Electronically Signed   By: Marcello Moores  Register   On: 03/29/2019 06:16   [place] Ekg Site Rite  Result Date: 03/28/2019 If Site Rite image not attached, placement could not be confirmed due to current cardiac rhythm.   Medical Problem List and Plan: 1.  Right side weakness with numbness secondary to left subcortical lacunar infarction secondary to small vessel disease after cardiac bypass surgery 03/25/2019.  Sternal precautions  Admit to CIR 2.  Antithrombotics: -DVT/anticoagulation: SCDs.  -antiplatelet therapy: Aspirin 81 mg daily, Plavix 25 mg daily x3 weeks then aspirin alone 3. Pain Management: Neurontin 300 mg twice daily, oxycodone/Ultram as needed 4. Mood: Provide emotional support  -antipsychotic agents: N/A 5. Neuropsych: This patient is not fully capable of making decisions on her own behalf. 6. Skin/Wound Care: Routine skin checks 7. Fluids/Electrolytes/Nutrition: Routine in and outs.  CMP ordered for tomorrow a.m. 8.  Acute blood loss anemia.  CBC ordered for tomorrow a.m. 9.  Hypertension.  Lopressor 25 mg twice daily, Lasix 40 mg  Daily  Monitor with increased mobility 10.   Diabetes mellitus with peripheral neuropathy.  Hemoglobin A1c 7.9.  Glucotrol 10 mg twice daily, Levemir 10 units twice daily.  Check blood sugars before meals and at bedtime  Monitor with increased mobility 11.  Tobacco abuse.  Continue NicoDerm patch as well as inhalers as directed.  Provide counseling 12.  Hyperlipidemia.  Lipitor 13.  Obesity.  BMI 32.98.  Dietary follow-up.  Encourage weight loss. 16.  Post stroke cognitive deficit  SLP ordered  Cathlyn Parsons, PA-C 03/30/2019   I have personally performed a face to face diagnostic evaluation, including, but not limited to relevant history and physical exam findings, of this patient and developed relevant assessment and plan.  Additionally, I have reviewed and concur with the physician assistant's documentation above.  Delice Lesch, MD, ABPMR

## 2019-03-29 NOTE — Plan of Care (Signed)
Post of emergent CABG, following sternal precautions, on milrinone gtt. S/p post op stroke with R sided weakness. RLE with improving strength/sensation. RUE = decreased sensation, no grip, +movement at elbow and shoulder, encouraging ROM, uses L hand to assist with gross movements of RUE when assisting with repositioning. In depth discussion about stroke, pt hopeful for continued improvement, emotional support provided.   Foley out, purewick in place, good diuresis.   Problem: Education: Goal: Knowledge of General Education information will improve Description: Including pain rating scale, medication(s)/side effects and non-pharmacologic comfort measures Outcome: Progressing   Problem: Health Behavior/Discharge Planning: Goal: Ability to manage health-related needs will improve Outcome: Progressing   Problem: Clinical Measurements: Goal: Ability to maintain clinical measurements within normal limits will improve Outcome: Progressing Goal: Will remain free from infection Outcome: Progressing Goal: Diagnostic test results will improve Outcome: Progressing Goal: Respiratory complications will improve Outcome: Progressing Goal: Cardiovascular complication will be avoided Outcome: Progressing   Problem: Activity: Goal: Risk for activity intolerance will decrease Outcome: Progressing   Problem: Nutrition: Goal: Adequate nutrition will be maintained Outcome: Progressing   Problem: Coping: Goal: Level of anxiety will decrease Outcome: Progressing   Problem: Elimination: Goal: Will not experience complications related to urinary retention Outcome: Progressing   Problem: Pain Managment: Goal: General experience of comfort will improve Outcome: Progressing   Problem: Safety: Goal: Ability to remain free from injury will improve Outcome: Progressing   Problem: Skin Integrity: Goal: Risk for impaired skin integrity will decrease Outcome: Progressing   Problem:  Education: Goal: Understanding of cardiac disease, CV risk reduction, and recovery process will improve Outcome: Progressing Goal: Understanding of medication regimen will improve Outcome: Progressing   Problem: Activity: Goal: Ability to tolerate increased activity will improve Outcome: Progressing   Problem: Cardiac: Goal: Ability to achieve and maintain adequate cardiopulmonary perfusion will improve Outcome: Progressing Goal: Vascular access site(s) Level 0-1 will be maintained Outcome: Progressing   Problem: Health Behavior/Discharge Planning: Goal: Ability to safely manage health-related needs after discharge will improve Outcome: Progressing   Problem: Education: Goal: Will demonstrate proper wound care and an understanding of methods to prevent future damage Outcome: Progressing Goal: Knowledge of disease or condition will improve Outcome: Progressing Goal: Knowledge of the prescribed therapeutic regimen will improve Outcome: Progressing   Problem: Activity: Goal: Risk for activity intolerance will decrease Outcome: Progressing   Problem: Cardiac: Goal: Will achieve and/or maintain hemodynamic stability Outcome: Progressing   Problem: Clinical Measurements: Goal: Postoperative complications will be avoided or minimized Outcome: Progressing   Problem: Respiratory: Goal: Respiratory status will improve Outcome: Progressing   Problem: Skin Integrity: Goal: Wound healing without signs and symptoms of infection Outcome: Progressing Goal: Risk for impaired skin integrity will decrease Outcome: Progressing   Problem: Urinary Elimination: Goal: Ability to achieve and maintain adequate renal perfusion and functioning will improve Outcome: Progressing   Problem: Education: Goal: Knowledge of secondary prevention will improve Outcome: Progressing Goal: Knowledge of patient specific risk factors addressed and post discharge goals established will improve Outcome:  Progressing

## 2019-03-29 NOTE — Progress Notes (Signed)
Physical Therapy Treatment Patient Details Name: Joan Webb MRN: [id number] DOB: [birth year] Today's Date: 03/29/2019    History of Present Illness Pt is a 48 yo female with PMH of DM, tobacco abuse who came to ED for chest pain on 11/4. Pt found to have a STEMI and underwent a CABGx2. Pt was complaining of R sided weakness post surgery. pt also found to have had a CVA per Dr. Rory Percy. Head CT-Small focus of hypodensity within the left centrum semiovale/corona radiatia suspicious for acute/subacute infarct.    PT Comments    Pt tolerated treatment well, ambulating for increased distances and increased trials. Pt continues to demonstrate significant RUE weakness, and intermittent R knee buckling/hyperextension 2/2 insufficient LE strength. Pt is impulsive and requires PT cueing and education on limitation of exercise due to medical complexity. Pt will benefit from continued acute PT services to reduce falls risk and restore independence in mobility. Pt will benefit from high intensity inpatient PT services upon discharge to aide in a progression to their prior level of function.   Follow Up Recommendations  CIR     Equipment Recommendations  (defer to post-acute setting)    Recommendations for Other Services       Precautions / Restrictions Precautions Precautions: Sternal Precaution Booklet Issued: No Precaution Comments: pt educated on sternal precautions, also noted to have R hemiparesis Restrictions Weight Bearing Restrictions: No Other Position/Activity Restrictions: sternal precautions    Mobility  Bed Mobility Overal bed mobility: (pt up in recliner upon arrival)                Transfers Overall transfer level: Needs assistance Equipment used: Rolling walker (2 wheeled) Transfers: Sit to/from Stand Sit to Stand: Min guard            Ambulation/Gait Ambulation/Gait assistance: Herbalist (Feet): 40 Feet(additional trials of 15 and  25') Assistive device: Rolling walker (2 wheeled) Gait Pattern/deviations: Step-to pattern;Decreased step length - right;Decreased step length - left;Decreased dorsiflexion - right Gait velocity: decreased Gait velocity interpretation: <1.8 ft/sec, indicate of risk for recurrent falls General Gait Details: pt with intermittent R knee hyperextension during stance phase, one instance of R knee buckling. Pt with short and shuffling steps   Stairs             Wheelchair Mobility    Modified Rankin (Stroke Patients Only) Modified Rankin (Stroke Patients Only) Pre-Morbid Rankin Score: No symptoms Modified Rankin: Moderately severe disability     Balance Overall balance assessment: Needs assistance Sitting-balance support: Feet supported;No upper extremity supported Sitting balance-Leahy Scale: Good Sitting balance - Comments: supervision   Standing balance support: Bilateral upper extremity supported Standing balance-Leahy Scale: Fair Standing balance comment: minG                            Cognition Arousal/Alertness: Awake/alert Behavior During Therapy: WFL for tasks assessed/performed Overall Cognitive Status: Impaired/Different from baseline Area of Impairment: Safety/judgement;Awareness;Problem solving                         Safety/Judgement: Decreased awareness of safety;Decreased awareness of deficits Awareness: Emergent   General Comments: impulsive      Exercises General Exercises - Lower Extremity Long Arc Quad: AROM;10 reps;Both    General Comments General comments (skin integrity, edema, etc.): pt hypertensive during session, BP ranging from 145/90 to 175/99. Pt reporting new onset sensation changes in face and R arm, RN  made aware.      Pertinent Vitals/Pain Pain Assessment: Faces Faces Pain Scale: Hurts little more Pain Location: RUE Pain Descriptors / Indicators: Burning Pain Intervention(s): Limited activity within  patient's tolerance    Home Living                      Prior Function            PT Goals (current goals can now be found in the care plan section) Acute Rehab PT Goals Patient Stated Goal: to use my R hand  Progress towards PT goals: Progressing toward goals    Frequency    Min 4X/week      PT Plan Current plan remains appropriate    Co-evaluation              AM-PAC PT "6 Clicks" Mobility   Outcome Measure  Help needed turning from your back to your side while in a flat bed without using bedrails?: A Lot Help needed moving from lying on your back to sitting on the side of a flat bed without using bedrails?: A Lot Help needed moving to and from a bed to a chair (including a wheelchair)?: A Little Help needed standing up from a chair using your arms (e.g., wheelchair or bedside chair)?: A Little Help needed to walk in hospital room?: A Little Help needed climbing 3-5 steps with a railing? : A Lot 6 Click Score: 15    End of Session Equipment Utilized During Treatment: Gait belt Activity Tolerance: Patient tolerated treatment well Patient left: in chair;with call bell/phone within reach;with nursing/sitter in room Nurse Communication: Mobility status PT Visit Diagnosis: Unsteadiness on feet (R26.81);Muscle weakness (generalized) (M62.81);Difficulty in walking, not elsewhere classified (R26.2);Hemiplegia and hemiparesis Hemiplegia - Right/Left: Right Hemiplegia - dominant/non-dominant: Dominant Hemiplegia - caused by: Cerebral infarction     Time: 7517-0017 PT Time Calculation (min) (ACUTE ONLY): 48 min  Charges:  $Gait Training: 23-37 mins $Therapeutic Activity: 8-22 mins                     Arlyss Gandy, PT, DPT Acute Rehabilitation Pager: [phone]    Arlyss Gandy 03/29/2019, 9:58 AM

## 2019-03-29 NOTE — Plan of Care (Signed)
  Problem: Education: Goal: Knowledge of General Education information will improve Description: Including pain rating scale, medication(s)/side effects and non-pharmacologic comfort measures Outcome: Progressing   Problem: Health Behavior/Discharge Planning: Goal: Ability to manage health-related needs will improve Outcome: Progressing   Problem: Clinical Measurements: Goal: Ability to maintain clinical measurements within normal limits will improve Outcome: Progressing Goal: Will remain free from infection Outcome: Progressing Goal: Diagnostic test results will improve Outcome: Progressing Goal: Respiratory complications will improve Outcome: Progressing Goal: Cardiovascular complication will be avoided Outcome: Progressing   Problem: Activity: Goal: Risk for activity intolerance will decrease Outcome: Progressing   Problem: Nutrition: Goal: Adequate nutrition will be maintained Outcome: Progressing   Problem: Coping: Goal: Level of anxiety will decrease Outcome: Progressing   Problem: Elimination: Goal: Will not experience complications related to urinary retention Outcome: Progressing   Problem: Pain Managment: Goal: General experience of comfort will improve Outcome: Progressing   Problem: Safety: Goal: Ability to remain free from injury will improve Outcome: Progressing   Problem: Skin Integrity: Goal: Risk for impaired skin integrity will decrease Outcome: Progressing   Problem: Education: Goal: Understanding of cardiac disease, CV risk reduction, and recovery process will improve Outcome: Progressing Goal: Understanding of medication regimen will improve Outcome: Progressing   Problem: Activity: Goal: Ability to tolerate increased activity will improve Outcome: Progressing   Problem: Cardiac: Goal: Ability to achieve and maintain adequate cardiopulmonary perfusion will improve Outcome: Progressing Goal: Vascular access site(s) Level 0-1 will be  maintained Outcome: Progressing   Problem: Health Behavior/Discharge Planning: Goal: Ability to safely manage health-related needs after discharge will improve Outcome: Progressing   Problem: Education: Goal: Will demonstrate proper wound care and an understanding of methods to prevent future damage Outcome: Progressing Goal: Knowledge of disease or condition will improve Outcome: Progressing Goal: Knowledge of the prescribed therapeutic regimen will improve Outcome: Progressing   Problem: Activity: Goal: Risk for activity intolerance will decrease Outcome: Progressing   Problem: Cardiac: Goal: Will achieve and/or maintain hemodynamic stability Outcome: Progressing   Problem: Clinical Measurements: Goal: Postoperative complications will be avoided or minimized Outcome: Progressing   Problem: Respiratory: Goal: Respiratory status will improve Outcome: Progressing   Problem: Skin Integrity: Goal: Wound healing without signs and symptoms of infection Outcome: Progressing Goal: Risk for impaired skin integrity will decrease Outcome: Progressing   Problem: Urinary Elimination: Goal: Ability to achieve and maintain adequate renal perfusion and functioning will improve Outcome: Progressing   Problem: Education: Goal: Knowledge of secondary prevention will improve Outcome: Progressing Goal: Knowledge of patient specific risk factors addressed and post discharge goals established will improve Outcome: Progressing

## 2019-03-29 NOTE — Progress Notes (Signed)
Epicardial pacing wires removed per protocol. VS q15 min. Bedrest begins 1528.

## 2019-03-29 NOTE — Op Note (Signed)
Joan Webb, HUNKINS MEDICAL RECORD [id number] ACCOUNT [id number] DATE OF BIRTH:[birth year] FACILITY: [facility] LOCATION: [facility] PHYSICIAN: VAN TRIGT III, MD  OPERATIVE REPORT  DATE OF PROCEDURE:  03/25/2019  OPERATION: 1.  Coronary artery bypass grafting x3 (left internal mammary artery to left anterior descending, saphenous vein graft to diagonal, saphenous vein graft to distal posterior descending branch of the right coronary). 2.  Endoscopic harvest of left greater saphenous vein by Jillyn Hidden PA-C.  PREOPERATIVE DIAGNOSIS:  Acute ST elevation myocardial infarction, poorly controlled diabetes, obesity.  POSTOPERATIVE DIAGNOSIS:  Acute ST elevation myocardial infarction, poorly controlled diabetes, obesity.  CLINICAL NOTE:  The patient is a 48 year old obese diabetic female who presented with chest pains and ST elevation on her inferior leads.  She was taken directly to the cardiac catheterization lab by Dr. Madlyn Frankel which demonstrated severe 3-vessel  coronary artery disease in a diabetic pattern with occlusion of a distal right coronary artery branch.  Her hemodynamics were stable and her chest pain resolved with heparin and nitroglycerin and she was placed on an antiplatelet agent as well.  I saw  the patient in consultation and felt that multivessel CABG would be her best long-term therapy.  Initially, patient refused surgery and sternotomy, but over the next 12-24 hours, she agreed to proceed with surgery after being counseled by her  cardiologist and family members and after I visited the patient several times as well.  I discussed the procedure of CABG in detail with the patient including the use of general anesthesia and cardiopulmonary bypass, the location of the surgical incisions, and the expected postoperative hospital recovery.  I discussed with the patient the  risks to her of coronary bypass surgery including the risks of stroke, bleeding, blood transfusion,  postoperative pulmonary problems, postoperative organ failure, and death.  She understood that CABG give her the best long-term therapy for her severe  coronary artery disease in a diabetic pattern and would improve her long-term survival, symptoms, and preserve LV function.  After reviewing these issues, she demonstrated her understanding and agreed to proceed with surgery under what I felt was an  informed consent.  OPERATIVE FINDINGS: 1.  Very difficult suboptimal saphenous vein conduit, but 2 segments were ultimately harvested. 2.  Poor quality targets because of diabetic diffuse disease.  Would not be a candidate for redo CABG. 3.  No packed cell transfusion required.  4.  Separation from cardiopulmonary bypass with improved global LV function.  DESCRIPTION OF PROCEDURE:  The patient was brought from preop holding where informed consent was documented and final questions were addressed.  The patient was brought to the operating room and placed supine on the operating table and general anesthesia  was induced under invasive hemodynamic monitoring.  The chest, abdomen and legs were prepped with Betadine, draped as a sterile field.  A proper time-out was performed.  A sternal incision was made as the saphenous vein was harvested using endoscopic technique of the left leg.  The left internal mammary artery was harvested as a pedicle graft from its origin at the subclavian vessels.  It was a 1.3 mm vessel with  adequate flow.  The sternal retractor was placed using the deep blades because of the patient's obese body habitus.  After the vein was harvested, the patient was heparinized and through pursestrings in the ascending aorta and right atrium, the patient  was cannulated and placed on bypass.  The coronaries were identified for grafting.  They were suboptimal targets, but graftable in the distal  right coronary posterior descending, the distal LAD and the diagonal branch of the LAD.  The mammary  artery and  vein grafts were prepared for the distal anastomoses and cardioplegia cannulas were placed for both antegrade and retrograde cold blood cardioplegia.  The patient was cooled to 32 degrees, and the aortic crossclamp was applied.  One liter of cold blood  cardioplegia was delivered in split doses between the antegrade aortic and retrograde coronary sinus catheters.  There was good cardioplegic arrest and supple temperature dropped less than 14 degrees.  Cardioplegia was delivered every 20 minutes while  the cross clamp was in place.  The distal coronary anastomoses were performed.  First, distal anastomosis was to the distal posterior descending.  It was a 1.2 mm vessel, proximal high-grade stenosis 90%.  A reverse saphenous vein was sewn end-to-side with running 7-0 Prolene, and there was adequate flow through the graft.   Cardioplegia was redosed.  A second distal anastomosis was to the diagonal branch of the LAD.  There was a proximal 80% to 90% stenosis.  Reverse saphenous vein was sewn to this 1.2 mm vessel with adequate flow.  Cardioplegia was redosed.  The third distal anastomosis was to the distal third of the LAD.  There was a proximal 90% stenosis.  The left IMA pedicle was brought through an opening in the left lateral pericardium and was brought down onto the LAD and sewn end-to-side with a  running 8-0 Prolene.  There was good flow through the anastomosis after briefly releasing the pedicle bulldog and the mammary artery.  The bulldog was reapplied and the pedicle was secured to the epicardium with 6-0 Prolenes.  Cardioplegia was redosed.  While the cross clamp was still in place, 2 proximal vein anastomoses were performed on the ascending aorta using a 4.5 mm punch and running 6-0 Prolene.  Prior to tying down the final proximal anastomosis, air was vented from the coronaries with a dose  of retrograde warm blood cardioplegia.  The crossclamp was removed.  The vein grafts  were deaired and opened.  Hemostasis was documented at the proximal and distal sites.  The patient was rewarmed and reperfused.  Temporary pacing wires were applied.  Lungs were expanded and the ventilator was resumed.  The patient was placed on low-dose milrinone and dopamine.  She was weaned off cardiopulmonary bypass without difficulty.  Cardiac output was satisfactory.  Echo showed improved global LV function.  Protamine was administered without adverse reaction.   The cannulas were removed.  Coagulation function returned  and was adequate.  The superior pericardial fat was closed over the aorta and vein grafts.  The anterior and bilateral pleural tubes were placed and brought out through separate incision.  The  sternum was closed with interrupted figure-of-eight steel wire.  The patient remained stable.  The pectoralis fascia was closed with a running Vicryl.  The subcutaneous and skin layers were closed using running Vicryl.  Sterile dressings were applied.   Total cardiopulmonary bypass time was 120 minutes.  PN/NUANCE  D:03/28/2019 T:03/29/2019 JOB:[id number]

## 2019-03-29 NOTE — Progress Notes (Signed)
Inpatient Rehabilitation Admissions Coordinator  Inpatient rehab consult received. I met with patient at bedside for rehab assessment. We discussed goals and expectations of an inpt rehab admit. Noted plans to remove epicardial wires tomorrow. Pt remains on IV Milrinone. Pt is a good candidate to admit to inpt rehab /CIR over the next 24 to 48 hrs once off IV milrinone. I will contact Financial counselor to make contact with patient for possible disability and Medicaid applications.  Danne Baxter, RN, MSN Rehab Admissions Coordinator [phone] 03/29/2019 11:19 AM

## 2019-03-30 ENCOUNTER — Inpatient Hospital Stay (HOSPITAL_COMMUNITY)
Admission: RE | Admit: 2019-03-30 | Discharge: 2019-04-09 | DRG: 057 | Disposition: A | Discharge status: Home Health | Payer: Medicaid Other | Source: Intra-hospital | Attending: Physical Medicine & Rehabilitation | Admitting: Physical Medicine & Rehabilitation

## 2019-03-30 ENCOUNTER — Inpatient Hospital Stay (HOSPITAL_COMMUNITY): Payer: Medicaid Other

## 2019-03-30 ENCOUNTER — Encounter (HOSPITAL_COMMUNITY): Payer: Self-pay

## 2019-03-30 DIAGNOSIS — K5900 Constipation, unspecified: Secondary | ICD-10-CM

## 2019-03-30 DIAGNOSIS — E1142 Type 2 diabetes mellitus with diabetic polyneuropathy: Secondary | ICD-10-CM | POA: Diagnosis present

## 2019-03-30 DIAGNOSIS — E669 Obesity, unspecified: Secondary | ICD-10-CM | POA: Diagnosis present

## 2019-03-30 DIAGNOSIS — Z7984 Long term (current) use of oral hypoglycemic drugs: Secondary | ICD-10-CM | POA: Diagnosis not present

## 2019-03-30 DIAGNOSIS — I639 Cerebral infarction, unspecified: Secondary | ICD-10-CM | POA: Diagnosis not present

## 2019-03-30 DIAGNOSIS — R52 Pain, unspecified: Secondary | ICD-10-CM | POA: Diagnosis not present

## 2019-03-30 DIAGNOSIS — Z951 Presence of aortocoronary bypass graft: Secondary | ICD-10-CM

## 2019-03-30 DIAGNOSIS — D696 Thrombocytopenia, unspecified: Secondary | ICD-10-CM

## 2019-03-30 DIAGNOSIS — I10 Essential (primary) hypertension: Secondary | ICD-10-CM | POA: Diagnosis present

## 2019-03-30 DIAGNOSIS — M7989 Other specified soft tissue disorders: Secondary | ICD-10-CM | POA: Diagnosis not present

## 2019-03-30 DIAGNOSIS — I2119 ST elevation (STEMI) myocardial infarction involving other coronary artery of inferior wall: Principal | ICD-10-CM

## 2019-03-30 DIAGNOSIS — G479 Sleep disorder, unspecified: Secondary | ICD-10-CM | POA: Diagnosis not present

## 2019-03-30 DIAGNOSIS — E785 Hyperlipidemia, unspecified: Secondary | ICD-10-CM | POA: Diagnosis present

## 2019-03-30 DIAGNOSIS — E871 Hypo-osmolality and hyponatremia: Secondary | ICD-10-CM | POA: Diagnosis not present

## 2019-03-30 DIAGNOSIS — R7309 Other abnormal glucose: Secondary | ICD-10-CM

## 2019-03-30 DIAGNOSIS — I69351 Hemiplegia and hemiparesis following cerebral infarction affecting right dominant side: Secondary | ICD-10-CM | POA: Diagnosis present

## 2019-03-30 DIAGNOSIS — I2510 Atherosclerotic heart disease of native coronary artery without angina pectoris: Secondary | ICD-10-CM | POA: Diagnosis present

## 2019-03-30 DIAGNOSIS — I82612 Acute embolism and thrombosis of superficial veins of left upper extremity: Secondary | ICD-10-CM | POA: Diagnosis not present

## 2019-03-30 DIAGNOSIS — E441 Mild protein-calorie malnutrition: Secondary | ICD-10-CM | POA: Diagnosis present

## 2019-03-30 DIAGNOSIS — R252 Cramp and spasm: Secondary | ICD-10-CM | POA: Diagnosis not present

## 2019-03-30 DIAGNOSIS — F419 Anxiety disorder, unspecified: Secondary | ICD-10-CM | POA: Diagnosis present

## 2019-03-30 DIAGNOSIS — M25512 Pain in left shoulder: Secondary | ICD-10-CM | POA: Diagnosis present

## 2019-03-30 DIAGNOSIS — I69319 Unspecified symptoms and signs involving cognitive functions following cerebral infarction: Secondary | ICD-10-CM

## 2019-03-30 DIAGNOSIS — E1151 Type 2 diabetes mellitus with diabetic peripheral angiopathy without gangrene: Secondary | ICD-10-CM | POA: Diagnosis present

## 2019-03-30 DIAGNOSIS — D62 Acute posthemorrhagic anemia: Secondary | ICD-10-CM

## 2019-03-30 DIAGNOSIS — Z7982 Long term (current) use of aspirin: Secondary | ICD-10-CM

## 2019-03-30 DIAGNOSIS — Z6832 Body mass index (BMI) 32.0-32.9, adult: Secondary | ICD-10-CM | POA: Diagnosis not present

## 2019-03-30 DIAGNOSIS — E8809 Other disorders of plasma-protein metabolism, not elsewhere classified: Secondary | ICD-10-CM

## 2019-03-30 DIAGNOSIS — J9811 Atelectasis: Secondary | ICD-10-CM | POA: Diagnosis present

## 2019-03-30 DIAGNOSIS — E6609 Other obesity due to excess calories: Secondary | ICD-10-CM

## 2019-03-30 DIAGNOSIS — I82A12 Acute embolism and thrombosis of left axillary vein: Secondary | ICD-10-CM

## 2019-03-30 DIAGNOSIS — Z79899 Other long term (current) drug therapy: Secondary | ICD-10-CM

## 2019-03-30 DIAGNOSIS — E1165 Type 2 diabetes mellitus with hyperglycemia: Secondary | ICD-10-CM

## 2019-03-30 DIAGNOSIS — F17210 Nicotine dependence, cigarettes, uncomplicated: Secondary | ICD-10-CM | POA: Diagnosis present

## 2019-03-30 DIAGNOSIS — I82B12 Acute embolism and thrombosis of left subclavian vein: Secondary | ICD-10-CM | POA: Diagnosis not present

## 2019-03-30 DIAGNOSIS — Z8249 Family history of ischemic heart disease and other diseases of the circulatory system: Secondary | ICD-10-CM | POA: Diagnosis not present

## 2019-03-30 DIAGNOSIS — R609 Edema, unspecified: Secondary | ICD-10-CM

## 2019-03-30 DIAGNOSIS — R4587 Impulsiveness: Secondary | ICD-10-CM | POA: Diagnosis present

## 2019-03-30 DIAGNOSIS — M79602 Pain in left arm: Secondary | ICD-10-CM | POA: Diagnosis not present

## 2019-03-30 DIAGNOSIS — E46 Unspecified protein-calorie malnutrition: Secondary | ICD-10-CM

## 2019-03-30 DIAGNOSIS — K5901 Slow transit constipation: Secondary | ICD-10-CM | POA: Diagnosis present

## 2019-03-30 DIAGNOSIS — I252 Old myocardial infarction: Secondary | ICD-10-CM

## 2019-03-30 LAB — BASIC METABOLIC PANEL
Anion gap: 11 (ref 5–15)
BUN: 16 mg/dL (ref 6–20)
CO2: 28 mmol/L (ref 22–32)
Calcium: 8.5 mg/dL — ABNORMAL LOW (ref 8.9–10.3)
Chloride: 96 mmol/L — ABNORMAL LOW (ref 98–111)
Creatinine, Ser: 0.58 mg/dL (ref 0.44–1.00)
GFR calc Af Amer: >60 mL/min (ref >60)
GFR calc non Af Amer: >60 mL/min (ref >60)
Glucose, Bld: 137 mg/dL — ABNORMAL HIGH (ref 70–99)
Potassium: 3.7 mmol/L (ref 3.5–5.1)
Sodium: 135 mmol/L (ref 135–145)

## 2019-03-30 LAB — CBC
HCT: 25.2 % — ABNORMAL LOW (ref 36.0–46.0)
Hemoglobin: 8.3 g/dL — ABNORMAL LOW (ref 12.0–15.0)
MCH: 29 pg (ref 26.0–34.0)
MCHC: 32.9 g/dL (ref 30.0–36.0)
MCV: 88.1 fL (ref 80.0–100.0)
Platelets: 303 10*3/uL (ref 150–400)
RBC: 2.86 MIL/uL — ABNORMAL LOW (ref 3.87–5.11)
RDW: 12.9 % (ref 11.5–15.5)
WBC: 7.7 10*3/uL (ref 4.0–10.5)
nRBC: 0.3 % — ABNORMAL HIGH (ref 0.0–0.2)

## 2019-03-30 LAB — COOXEMETRY PANEL
Carboxyhemoglobin: 1.8 % — ABNORMAL HIGH (ref 0.5–1.5)
Methemoglobin: 1.2 % (ref 0.0–1.5)
O2 Saturation: 56.8 %
Total hemoglobin: 8 g/dL — ABNORMAL LOW (ref 12.0–16.0)

## 2019-03-30 LAB — GLUCOSE, CAPILLARY
Glucose-Capillary: 169 mg/dL — ABNORMAL HIGH (ref 70–99)
Glucose-Capillary: 184 mg/dL — ABNORMAL HIGH (ref 70–99)
Glucose-Capillary: 117 mg/dL — ABNORMAL HIGH (ref 70–99)
Glucose-Capillary: 173 mg/dL — ABNORMAL HIGH (ref 70–99)

## 2019-03-30 MED ORDER — GABAPENTIN 300 MG PO CAPS
300.0000 mg | ORAL_CAPSULE | Freq: Two times a day (BID) | ORAL | Status: DC
  Administered 2019-03-30 – 2019-04-09 (×20): 300 mg via ORAL
  Filled 2019-03-30 (×20): qty 1

## 2019-03-30 MED ORDER — ASPIRIN 81 MG PO TBEC: 81.0000 mg | DELAYED_RELEASE_TABLET | Freq: Every day | ORAL | Status: AC

## 2019-03-30 MED ORDER — POTASSIUM CHLORIDE CRYS ER 20 MEQ PO TBCR
20.0000 meq | EXTENDED_RELEASE_TABLET | ORAL | Status: DC
  Administered 2019-03-30 (×2): 20 meq via ORAL
  Filled 2019-03-30 (×2): qty 1

## 2019-03-30 MED ORDER — METOPROLOL TARTRATE 25 MG PO TABS: 25.0000 mg | ORAL_TABLET | Freq: Two times a day (BID) | ORAL | Status: DC

## 2019-03-30 MED ORDER — INFLUENZA VAC SPLIT QUAD 0.5 ML IM SUSY
0.5000 mL | PREFILLED_SYRINGE | INTRAMUSCULAR | Status: DC
  Filled 2019-03-30: qty 0.5

## 2019-03-30 MED ORDER — DOCUSATE SODIUM 100 MG PO CAPS
200.0000 mg | ORAL_CAPSULE | Freq: Every day | ORAL | Status: DC
  Administered 2019-03-31 – 2019-04-09 (×10): 200 mg via ORAL
  Filled 2019-03-30 (×11): qty 2

## 2019-03-30 MED ORDER — POTASSIUM CHLORIDE CRYS ER 20 MEQ PO TBCR
20.0000 meq | EXTENDED_RELEASE_TABLET | ORAL | Status: AC
  Administered 2019-03-30 (×2): 20 meq via ORAL
  Filled 2019-03-30 (×2): qty 1

## 2019-03-30 MED ORDER — METOPROLOL TARTRATE 25 MG PO TABS
25.0000 mg | ORAL_TABLET | Freq: Two times a day (BID) | ORAL | Status: DC
  Administered 2019-03-30 – 2019-04-09 (×18): 25 mg via ORAL
  Filled 2019-03-30 (×20): qty 1

## 2019-03-30 MED ORDER — ATORVASTATIN CALCIUM 80 MG PO TABS: 80.0000 mg | ORAL_TABLET | Freq: Every day | ORAL | Status: DC

## 2019-03-30 MED ORDER — ASPIRIN EC 81 MG PO TBEC
81.0000 mg | DELAYED_RELEASE_TABLET | Freq: Every day | ORAL | Status: DC
  Administered 2019-03-30: 81 mg via ORAL
  Filled 2019-03-30: qty 1

## 2019-03-30 MED ORDER — PNEUMOCOCCAL VAC POLYVALENT 25 MCG/0.5ML IJ INJ
0.5000 mL | INJECTION | INTRAMUSCULAR | Status: DC
  Filled 2019-03-30: qty 0.5

## 2019-03-30 MED ORDER — ACETAMINOPHEN 325 MG PO TABS
325.0000 mg | ORAL_TABLET | ORAL | Status: DC | PRN
  Administered 2019-03-31: 650 mg via ORAL
  Administered 2019-03-31: 325 mg via ORAL
  Administered 2019-03-31 – 2019-04-09 (×7): 650 mg via ORAL
  Filled 2019-03-30 (×3): qty 2
  Filled 2019-03-30: qty 1
  Filled 2019-03-30 (×6): qty 2

## 2019-03-30 MED ORDER — METOLAZONE 5 MG PO TABS
5.0000 mg | ORAL_TABLET | Freq: Once | ORAL | Status: AC
  Administered 2019-03-30: 5 mg via ORAL
  Filled 2019-03-30: qty 1

## 2019-03-30 MED ORDER — GUAIFENESIN ER 600 MG PO TB12: 600.0000 mg | ORAL_TABLET | Freq: Two times a day (BID) | ORAL | Status: DC

## 2019-03-30 MED ORDER — FERROUS SULFATE 325 (65 FE) MG PO TABS: 325.0000 mg | ORAL_TABLET | Freq: Every day | ORAL | 0 refills | Status: DC

## 2019-03-30 MED ORDER — MOMETASONE FURO-FORMOTEROL FUM 100-5 MCG/ACT IN AERO
2.0000 | INHALATION_SPRAY | Freq: Two times a day (BID) | RESPIRATORY_TRACT | Status: DC
  Administered 2019-03-30 – 2019-04-09 (×20): 2 via RESPIRATORY_TRACT
  Filled 2019-03-30: qty 8.8

## 2019-03-30 MED ORDER — CLOPIDOGREL BISULFATE 75 MG PO TABS: 75.0000 mg | ORAL_TABLET | Freq: Every day | ORAL | Status: DC

## 2019-03-30 MED ORDER — GUAIFENESIN ER 600 MG PO TB12
600.0000 mg | ORAL_TABLET | Freq: Two times a day (BID) | ORAL | Status: DC
  Administered 2019-03-30 – 2019-04-09 (×20): 600 mg via ORAL
  Filled 2019-03-30 (×20): qty 1

## 2019-03-30 MED ORDER — FUROSEMIDE 40 MG PO TABS: 40.0000 mg | ORAL_TABLET | Freq: Every day | ORAL | Status: DC

## 2019-03-30 MED ORDER — GLIPIZIDE 10 MG PO TABS
10.0000 mg | ORAL_TABLET | Freq: Two times a day (BID) | ORAL | Status: DC
  Administered 2019-03-30 – 2019-04-09 (×20): 10 mg via ORAL
  Filled 2019-03-30 (×20): qty 1

## 2019-03-30 MED ORDER — NICOTINE 14 MG/24HR TD PT24
14.0000 mg | MEDICATED_PATCH | Freq: Every day | TRANSDERMAL | Status: DC
  Administered 2019-03-31 – 2019-04-09 (×10): 14 mg via TRANSDERMAL
  Filled 2019-03-30 (×10): qty 1

## 2019-03-30 MED ORDER — INSULIN DETEMIR 100 UNIT/ML ~~LOC~~ SOLN
10.0000 [IU] | Freq: Two times a day (BID) | SUBCUTANEOUS | Status: DC
  Administered 2019-03-30 – 2019-04-05 (×12): 10 [IU] via SUBCUTANEOUS
  Filled 2019-03-30 (×13): qty 0.1

## 2019-03-30 MED ORDER — CLOPIDOGREL BISULFATE 75 MG PO TABS
75.0000 mg | ORAL_TABLET | Freq: Every day | ORAL | Status: DC
  Administered 2019-03-31 – 2019-04-05 (×6): 75 mg via ORAL
  Filled 2019-03-30 (×6): qty 1

## 2019-03-30 MED ORDER — TRAMADOL HCL 50 MG PO TABS
50.0000 mg | ORAL_TABLET | ORAL | Status: DC | PRN
  Administered 2019-03-31 – 2019-04-08 (×11): 100 mg via ORAL
  Administered 2019-04-08: 50 mg via ORAL
  Administered 2019-04-09 (×2): 100 mg via ORAL
  Filled 2019-03-30 (×3): qty 2
  Filled 2019-03-30: qty 1
  Filled 2019-03-30 (×3): qty 2
  Filled 2019-03-30: qty 1
  Filled 2019-03-30 (×6): qty 2
  Filled 2019-03-30: qty 1

## 2019-03-30 MED ORDER — INSULIN ASPART 100 UNIT/ML ~~LOC~~ SOLN
0.0000 [IU] | Freq: Three times a day (TID) | SUBCUTANEOUS | Status: DC
  Administered 2019-03-31: 2 [IU] via SUBCUTANEOUS
  Administered 2019-03-31 – 2019-04-01 (×2): 3 [IU] via SUBCUTANEOUS
  Administered 2019-04-01 – 2019-04-02 (×3): 5 [IU] via SUBCUTANEOUS
  Administered 2019-04-02 (×2): 3 [IU] via SUBCUTANEOUS
  Administered 2019-04-03: 2 [IU] via SUBCUTANEOUS
  Administered 2019-04-03 (×2): 3 [IU] via SUBCUTANEOUS
  Administered 2019-04-04: 5 [IU] via SUBCUTANEOUS
  Administered 2019-04-04: 3 [IU] via SUBCUTANEOUS
  Administered 2019-04-04: 2 [IU] via SUBCUTANEOUS
  Administered 2019-04-05 (×2): 3 [IU] via SUBCUTANEOUS
  Administered 2019-04-05: 5 [IU] via SUBCUTANEOUS
  Administered 2019-04-06 (×3): 3 [IU] via SUBCUTANEOUS
  Administered 2019-04-07: 8 [IU] via SUBCUTANEOUS
  Administered 2019-04-07 – 2019-04-08 (×2): 3 [IU] via SUBCUTANEOUS
  Administered 2019-04-08: 8 [IU] via SUBCUTANEOUS
  Administered 2019-04-09: 3 [IU] via SUBCUTANEOUS

## 2019-03-30 MED ORDER — FE FUMARATE-B12-VIT C-FA-IFC PO CAPS
1.0000 | ORAL_CAPSULE | Freq: Two times a day (BID) | ORAL | Status: DC
  Administered 2019-03-30 – 2019-03-31 (×2): 1 via ORAL
  Filled 2019-03-30 (×3): qty 1

## 2019-03-30 MED ORDER — BISACODYL 10 MG RE SUPP
10.0000 mg | Freq: Every day | RECTAL | Status: DC
  Filled 2019-03-30 (×4): qty 1

## 2019-03-30 MED ORDER — FUROSEMIDE 10 MG/ML IJ SOLN
40.0000 mg | Freq: Once | INTRAMUSCULAR | Status: AC
  Administered 2019-03-30: 40 mg via INTRAVENOUS
  Filled 2019-03-30: qty 4

## 2019-03-30 MED ORDER — FUROSEMIDE 40 MG PO TABS
40.0000 mg | ORAL_TABLET | Freq: Every day | ORAL | Status: DC
  Administered 2019-03-31 – 2019-04-09 (×10): 40 mg via ORAL
  Filled 2019-03-30 (×10): qty 1

## 2019-03-30 MED ORDER — OXYCODONE HCL 5 MG PO TABS
5.0000 mg | ORAL_TABLET | ORAL | Status: DC | PRN
  Administered 2019-03-30 – 2019-04-06 (×28): 10 mg via ORAL
  Filled 2019-03-30 (×31): qty 2

## 2019-03-30 MED ORDER — POTASSIUM CHLORIDE CRYS ER 20 MEQ PO TBCR: 20.0000 meq | EXTENDED_RELEASE_TABLET | Freq: Every day | ORAL | 0 refills | Status: DC

## 2019-03-30 MED ORDER — BISACODYL 5 MG PO TBEC
10.0000 mg | DELAYED_RELEASE_TABLET | Freq: Every day | ORAL | Status: DC
  Administered 2019-03-31 – 2019-04-09 (×9): 10 mg via ORAL
  Filled 2019-03-30 (×10): qty 2

## 2019-03-30 MED ORDER — NICOTINE 14 MG/24HR TD PT24: 14.0000 mg | MEDICATED_PATCH | Freq: Every day | TRANSDERMAL | 0 refills | Status: DC

## 2019-03-30 MED ORDER — PANTOPRAZOLE SODIUM 40 MG PO TBEC
40.0000 mg | DELAYED_RELEASE_TABLET | Freq: Every day | ORAL | Status: DC
  Administered 2019-03-31 – 2019-04-09 (×10): 40 mg via ORAL
  Filled 2019-03-30 (×10): qty 1

## 2019-03-30 MED ORDER — FUROSEMIDE 40 MG PO TABS: 40.0000 mg | ORAL_TABLET | Freq: Every day | ORAL | 0 refills | Status: DC

## 2019-03-30 MED ORDER — FE FUMARATE-B12-VIT C-FA-IFC PO CAPS
1.0000 | ORAL_CAPSULE | Freq: Two times a day (BID) | ORAL | Status: DC
  Administered 2019-03-30: 1 via ORAL
  Filled 2019-03-30: qty 1

## 2019-03-30 MED ORDER — SORBITOL 70 % SOLN
30.0000 mL | Freq: Every day | Status: DC | PRN
  Administered 2019-03-30: 30 mL via ORAL
  Filled 2019-03-30 (×2): qty 30

## 2019-03-30 MED ORDER — CLOPIDOGREL BISULFATE 75 MG PO TABS
75.0000 mg | ORAL_TABLET | Freq: Every day | ORAL | Status: DC
  Administered 2019-03-30: 75 mg via ORAL
  Filled 2019-03-30: qty 1

## 2019-03-30 MED ORDER — ATORVASTATIN CALCIUM 80 MG PO TABS
80.0000 mg | ORAL_TABLET | Freq: Every day | ORAL | Status: DC
  Administered 2019-03-30 – 2019-04-08 (×10): 80 mg via ORAL
  Filled 2019-03-30 (×10): qty 1

## 2019-03-30 MED ORDER — ASPIRIN EC 81 MG PO TBEC
81.0000 mg | DELAYED_RELEASE_TABLET | Freq: Every day | ORAL | Status: DC
  Administered 2019-03-31 – 2019-04-09 (×10): 81 mg via ORAL
  Filled 2019-03-30 (×10): qty 1

## 2019-03-30 MED ORDER — PNEUMOCOCCAL 13-VAL CONJ VACC IM SUSP: 0.5000 mL | INTRAMUSCULAR | Status: DC

## 2019-03-30 MED ORDER — OXYCODONE HCL 5 MG PO TABS: 5.0000 mg | ORAL_TABLET | ORAL | 0 refills | Status: DC | PRN

## 2019-03-30 NOTE — Progress Notes (Signed)
Inpatient Rehabilitation Admissions Coordinator  I met with patient at bedside and she is in agreement to admit to CIR/inpt rehab today. I have notified SW, Shelton Silvas as well as Physiological scientist. I will make th arrangements to admit today.  Danne Baxter, RN, MSN Rehab Admissions Coordinator [phone] 03/30/2019 10:12 AM

## 2019-03-30 NOTE — Progress Notes (Signed)
Physical Therapy Treatment Patient Details Name: Joan Webb MRN: [id number] DOB: 26-Mar-1971 Today's Date: 03/30/2019    History of Present Illness Pt is a 48 yo female with PMH of DM, tobacco abuse who came to ED 03/23/19 with chest pain. Found to have STEMI and underwent a CABGx2. Pt was complaining of R sided weakness post surgery; found to have had a CVA. Head CT-Small focus of hypodensity within the left centrum semiovale/corona radiatia suspicious for acute/subacute infarct.   PT Comments    Pt progressing with mobility; remains limited by R-side hemiparesis, decreased activity tolerance, poor attention and decreased awareness. Pt requires min-modA to manage RUE with mobility tasks. Pt with decreased attention and limited ability to follow multi-steps commands, which is exacerbated with mobility in busy hallway environment, requiring max cues for safety and increased assist to maintain stability. Remains at high risk for falls. Pt motivated to participate and regain PLOF. Continue to recommend intensive CIR-level therapies.    Follow Up Recommendations  CIR;Supervision/Assistance - 24 hour     Equipment Recommendations  (defer)    Recommendations for Other Services       Precautions / Restrictions Precautions Precautions: Sternal;Fall;Other (comment) Precaution Comments: R-side hemiparesis    Mobility  Bed Mobility Overal bed mobility: Needs Assistance Bed Mobility: Rolling;Sidelying to Sit Rolling: Min assist Sidelying to sit: Min assist       General bed mobility comments: Cues for sternal precautions; pt using LUE to assist RUE with bed mobility  Transfers Overall transfer level: Needs assistance Equipment used: Rolling walker (2 wheeled)   Sit to Stand: Min assist         General transfer comment: Assist to maintain sternal precautions and manage RUE  Ambulation/Gait Ambulation/Gait assistance: Min assist;Mod assist Gait Distance (Feet): 24  Feet(+100) Assistive device: Rolling walker (2 wheeled) Gait Pattern/deviations: Step-to pattern;Decreased step length - right;Decreased step length - left;Decreased dorsiflexion - right Gait velocity: Decreased Gait velocity interpretation: <1.8 ft/sec, indicate of risk for recurrent falls General Gait Details: Slow, unsteady gait with RW requiring consistent min-modA to keep RUE support on RW and cue grip strength, intermittent R knee hyperextension during stance phase, reliant on increased hip flexion to clear R foot with step; required prolonged seated rest break after ADL tasks. Difficulty multi-tasking and following multi-step commands exacerbated while ambulating and in busy hallway environment   Stairs             Wheelchair Mobility    Modified Rankin (Stroke Patients Only) Modified Rankin (Stroke Patients Only) Pre-Morbid Rankin Score: No symptoms Modified Rankin: Moderately severe disability     Balance Overall balance assessment: Needs assistance   Sitting balance-Leahy Scale: Fair       Standing balance-Leahy Scale: Poor Standing balance comment: Reliant on UE support                            Cognition Arousal/Alertness: Awake/alert Behavior During Therapy: WFL for tasks assessed/performed Overall Cognitive Status: Impaired/Different from baseline Area of Impairment: Attention;Memory;Following commands;Safety/judgement;Awareness;Problem solving                   Current Attention Level: Sustained;Selective Memory: Decreased short-term memory;Decreased recall of precautions Following Commands: Follows multi-step commands inconsistently Safety/Judgement: Decreased awareness of safety;Decreased awareness of deficits Awareness: Emergent Problem Solving: Slow processing;Difficulty sequencing;Requires verbal cues General Comments: Poor attention and side tracked starting conversations that are not related to current topic, easily distracted  requiring frequent redirection to task.  Pleasant and motivated to participate; does demonstrate insight into new deficits post-stroke      Exercises      General Comments General comments (skin integrity, edema, etc.): SPO2 down to 86% on RA (unreliable pleth), returning to 92-94% with standing/seated rest      Pertinent Vitals/Pain Pain Assessment: Faces Faces Pain Scale: Hurts little more Pain Location: RUE with AAROM/PROM Pain Descriptors / Indicators: Sore;Grimacing;Crying Pain Intervention(s): Monitored during session    Home Living   Living Arrangements: Alone Available Help at Discharge: Family;Available 24 hours/day(exhusband Francis Dowse and their adult children to arrange 24/7 supe) Type of Home: (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)              Prior Function            PT Goals (current goals can now be found in the care plan section) Progress towards PT goals: Progressing toward goals    Frequency    Min 4X/week      PT Plan Current plan remains appropriate    Co-evaluation PT/OT/SLP Co-Evaluation/Treatment: Yes Reason for Co-Treatment: Necessary to address cognition/behavior during functional activity;For patient/therapist safety;To address functional/ADL transfers PT goals addressed during session: Mobility/safety with mobility;Balance        AM-PAC PT "6 Clicks" Mobility   Outcome Measure  Help needed turning from your back to your side while in a flat bed without using bedrails?: A Little Help needed moving from lying on your back to sitting on the side of a flat bed without using bedrails?: A Lot Help needed moving to and from a bed to a chair (including a wheelchair)?: A Little Help needed standing up from a chair using your arms (e.g., wheelchair or bedside chair)?: A Little Help needed to walk in hospital room?: A Little Help needed climbing 3-5 steps with a railing? : A Lot 6 Click Score: 16    End of Session  Equipment Utilized During Treatment: Gait belt Activity Tolerance: Patient tolerated treatment well Patient left: in bed;with call bell/phone within reach;with family/visitor present Nurse Communication: Mobility status PT Visit Diagnosis: Unsteadiness on feet (R26.81);Muscle weakness (generalized) (M62.81);Difficulty in walking, not elsewhere classified (R26.2);Hemiplegia and hemiparesis Hemiplegia - Right/Left: Right Hemiplegia - dominant/non-dominant: Dominant Hemiplegia - caused by: Cerebral infarction     Time: 2956-2130 PT Time Calculation (min) (ACUTE ONLY): 44 min  Charges:  $Gait Training: 8-22 mins                    Ina Homes, PT, DPT Acute Rehabilitation Services  Pager [phone] Office [phone]  Malachy Chamber 03/30/2019, 11:49 AM

## 2019-03-30 NOTE — Progress Notes (Signed)
Occupational Therapy Treatment Patient Details Name: Joan Webb MRN: [id number] DOB: [birth year] Today's Date: 03/30/2019    History of present illness Pt is a 48 yo female with PMH of DM, tobacco abuse who came to ED 03/23/19 with chest pain. Found to have STEMI and underwent a CABGx2. Pt was complaining of R sided weakness post surgery; found to have had a CVA. Head CT-Small focus of hypodensity within the left centrum semiovale/corona radiatia suspicious for acute/subacute infarct.   OT comments  Patient eager to engage in OT/PT session. Completing transfers and mobility using RW  throughout session with min-mod assist to manage R UE .  Patient requires constant cueing to attend to tasks, easily distracted and impulsive at times; limited ability to follow multiple step commands, especially in distracting environment. Noted improved awareness to deficits and need for assistance.  Pt able to focus and attend to R hand in order to initate grasp and active ROM.  Continue to recommend CIR.    Follow Up Recommendations  CIR;Supervision/Assistance - 24 hour    Equipment Recommendations  3 in 1 bedside commode    Recommendations for Other Services      Precautions / Restrictions Precautions Precautions: Sternal;Fall;Other (comment) Precaution Booklet Issued: No Precaution Comments: R-side hemiparesis Restrictions Other Position/Activity Restrictions: sternal precautions       Mobility Bed Mobility Overal bed mobility: Needs Assistance Bed Mobility: Rolling;Sidelying to Sit Rolling: Min assist Sidelying to sit: Min assist       General bed mobility comments: Cues for sternal precautions; pt using LUE to assist RUE with bed mobility  Transfers Overall transfer level: Needs assistance Equipment used: Rolling walker (2 wheeled) Transfers: Sit to/from Stand Sit to Stand: Min assist         General transfer comment: Assist to maintain sternal precautions and manage RUE     Balance Overall balance assessment: Needs assistance Sitting-balance support: No upper extremity supported;Feet supported Sitting balance-Leahy Scale: Fair     Standing balance support: Bilateral upper extremity supported;During functional activity;No upper extremity supported Standing balance-Leahy Scale: Poor Standing balance comment: Reliant on UE support, min guard to min assist for grooming at sink with 0-1 hand support                           ADL either performed or assessed with clinical judgement   ADL Overall ADL's : Needs assistance/impaired     Grooming: Minimal assistance;Standing;Wash/dry face;Wash/dry hands;Brushing hair                   Toilet Transfer: Minimal assistance;Ambulation;BSC;RW Toilet Transfer Details (indicate cue type and reason): 3:1 over commode Toileting- Clothing Manipulation and Hygiene: Minimal assistance;Sit to/from stand       Functional mobility during ADLs: Minimal assistance;Rolling walker;Cueing for safety;Cueing for sequencing General ADL Comments: pt limited by cognition, R hemi, sternal precautions and decreased activity tolerance     Vision       Perception     Praxis      Cognition Arousal/Alertness: Awake/alert Behavior During Therapy: WFL for tasks assessed/performed Overall Cognitive Status: Impaired/Different from baseline Area of Impairment: Attention;Memory;Following commands;Safety/judgement;Awareness;Problem solving                   Current Attention Level: Sustained;Selective Memory: Decreased short-term memory;Decreased recall of precautions Following Commands: Follows multi-step commands inconsistently Safety/Judgement: Decreased awareness of safety;Decreased awareness of deficits Awareness: Emergent Problem Solving: Slow processing;Difficulty sequencing;Requires verbal cues General Comments: Poor attention  and side tracked starting conversations that are not related to current  topic, easily distracted requiring frequent redirection to task. Pleasant and motivated to participate; does demonstrate insight into new deficits post-stroke        Exercises Exercises: Other exercises Other Exercises Other Exercises: focused on R hand flexion, with minimal movement noted to initate grasp x 3 reps given maxmial cueing to focus on task  Other Exercises: completed trail making task (3 tasks) with minimal cueing for attention to task, mod cueing to recall final task and problem solve through finding location of nourishment room   Shoulder Instructions       General Comments SPO2 down to 86% on RA (unreliable pleth), returning to 92-94% with standing/seated rest    Pertinent Vitals/ Pain       Pain Assessment: Faces Faces Pain Scale: Hurts little more Pain Location: RUE with AAROM/PROM Pain Descriptors / Indicators: Sore;Grimacing;Crying Pain Intervention(s): Monitored during session;Repositioned  Home Living                                          Prior Functioning/Environment              Frequency  Min 2X/week        Progress Toward Goals  OT Goals(current goals can now be found in the care plan section)  Progress towards OT goals: Progressing toward goals  Acute Rehab OT Goals Patient Stated Goal: to use my R hand  OT Goal Formulation: With patient  Plan Discharge plan remains appropriate;Frequency remains appropriate    Co-evaluation    PT/OT/SLP Co-Evaluation/Treatment: Yes Reason for Co-Treatment: Necessary to address cognition/behavior during functional activity;To address functional/ADL transfers;For patient/therapist safety PT goals addressed during session: Mobility/safety with mobility;Balance OT goals addressed during session: ADL's and self-care      AM-PAC OT "6 Clicks" Daily Activity     Outcome Measure   Help from another person eating meals?: A Little Help from another person taking care of personal  grooming?: A Little Help from another person toileting, which includes using toliet, bedpan, or urinal?: A Little Help from another person bathing (including washing, rinsing, drying)?: A Lot Help from another person to put on and taking off regular upper body clothing?: A Lot Help from another person to put on and taking off regular lower body clothing?: A Lot 6 Click Score: 15    End of Session Equipment Utilized During Treatment: Gait belt  OT Visit Diagnosis: Other abnormalities of gait and mobility (R26.89);Muscle weakness (generalized) (M62.81);Other symptoms and signs involving cognitive function;Hemiplegia and hemiparesis Hemiplegia - Right/Left: Right Hemiplegia - dominant/non-dominant: Dominant Hemiplegia - caused by: Cerebral infarction   Activity Tolerance Patient tolerated treatment well   Patient Left in bed;with call bell/phone within reach;with family/visitor present   Nurse Communication Mobility status        Time: 1050-1131 OT Time Calculation (min): 41 min  Charges: OT General Charges $OT Visit: 1 Visit OT Treatments $Self Care/Home Management : 23-37 mins  Chancy Milroy, OT Acute Rehabilitation Services Pager [phone] Office [phone]    Chancy Milroy 03/30/2019, 2:58 PM

## 2019-03-30 NOTE — Progress Notes (Signed)
Jamse Arn, MD  Physician  Physical Medicine and Rehabilitation  PMR Pre-admission  Signed  Date of Service:  03/30/2019 10:21 AM      Related encounter: ED to Hosp-Admission (Discharged) from 03/23/2019 in [place] ICU      Signed         Show:Clear all '[x]'$ Manual'[x]'$ Template'[x]'$ Copied  Added by: '[x]'$ Cristina Gong, RN'[x]'$ Jamse Arn, MD  '[]'$ Hover for details PMR Admission Coordinator Pre-Admission Assessment  Patient: Akyla Vavrek is an 48 y.o., female MRN: [id number] DOB: [birth year] Height: '5\' 2"'$  (157.5 cm) Weight: 82.9 kg  Insurance Information HMO:     PPO:      PCP:      IPA:      80/20:      OTHER:  PRIMARY: uninsured        I left two voicemail to financial counselor 11/10 and 11/11 to request Disability and Medicaid applications inquiry. Shanon Rosser at [phone]  Medicaid Application Date:       Case Manager:  Disability Application Date:       Case Worker:   The "Data Collection Information Summary" for patients in Inpatient Rehabilitation Facilities with attached "Privacy Act [place] Records" was provided and verbally reviewed with: N/A  Emergency Contact Information         Contact Information    Name Relation Home Work Ch[place]al, Thurmond Butts Son   [phone]   Josph Macho Stepfather   [phone]   Iline Oven Mother   [phone]      Current Medical History  Patient Admitting Diagnosis:  CVA s/p CABG  History of Present Illness: 48 year old right-handed female with history of diabetes mellitus, tobacco abuse and hyperlipidemia as well as morbid obesity with BMI 32.98. Presented 03/23/2019 with acute onset of chest pain. EKG showed inferior ST elevation. Code STEMI was activated. Noted sodium 131, glucose 338, troponin 24, WBC 14,300, SARS Covid negative. Chest x-ray negative. She was taken to the Cath Lab demonstrating severe multivessel coronary artery disease.  Underwent CABG x3 03/25/2019 per Dr. Darcey Nora. Hospital course pain management. Sternal precautions as indicated. Acute blood loss anemia latest hemoglobin 8.3. On 03/27/2019 patient with right arm weakness and numbness. Cranial CT scan showed small focus of hypodensity within the left centrum semi ovale/corona radiata. CTA of the neck bilateral common carotid, internal carotid and vertebral arteries patent within the neck without significant stenosis. CTA of head with no large vessel occlusion. MRI of the brain not completed due to postsurgical staples. Carotid Dopplers 1 to 39% stenosis. Echocardiogram with ejection fraction of 65% no increase in right ventricular wall thickness. Patient's diet was slowly advanced to a regular consistency. Epicardial wires removed 11/11/2020and follow-up chest x-ray completed showing progressive bilateral pulmonary infiltrates/edema. Progressive bibasilar atelectasis. Patient remained on Lasix therapy as directed.. Neurology follow-up patient to be maintained on aspirin 81 mg daily and Plavix 75 mg daily x3 weeks then aspirin alone.Tolerating a regular consistency diet.   Complete NIHSS TOTAL: 6  Patient's medical [facility] has been reviewed by the rehabilitation admission coordinator and physician.  Past Medical History      Past Medical History:  Diagnosis Date  . Anxiety   . Coronary artery disease   . Diabetes ([place])   . Hyperlipidemia   . Myocardial infarction ([place])   . Tobacco abuse     Family History   family history includes Heart attack in her father.  Prior Rehab/Hospitalizations Has the patient had prior rehab  or hospitalizations prior to admission? Yes  Has the patient had major surgery during 100 days prior to admission? Yes             Current Medications  Current Facility-Administered Medications:  .  0.45 % sodium chloride infusion, , Intravenous, Continuous PRN, Antony Odea,  PA-C, Stopped at 03/27/19 2300 .  0.9 %  sodium chloride infusion, 250 mL, Intravenous, Continuous, Roddenberry, Myron G, PA-C .  0.9 %  sodium chloride infusion, , Intravenous, Continuous, Roddenberry, Myron G, PA-C, Last Rate: 10 mL/hr at 03/25/19 1930 .  acetaminophen (TYLENOL) tablet 1,000 mg, 1,000 mg, Oral, Q6H, 1,000 mg at 03/30/19 0644 **OR** acetaminophen (TYLENOL) 160 MG/5ML solution 1,000 mg, 1,000 mg, Per Tube, Q6H, Roddenberry, Myron G, PA-C, 1,000 mg at 03/25/19 2346 .  aspirin EC tablet 81 mg, 81 mg, Oral, Daily, Prescott Gum, Collier Salina, MD .  atorvastatin (LIPITOR) tablet 80 mg, 80 mg, Oral, q1800, Sherren Mocha, MD, 80 mg at 03/29/19 1703 .  bisacodyl (DULCOLAX) EC tablet 10 mg, 10 mg, Oral, Daily, 10 mg at 03/29/19 0834 **OR** bisacodyl (DULCOLAX) suppository 10 mg, 10 mg, Rectal, Daily, Roddenberry, Myron G, PA-C .  Chlorhexidine Gluconate Cloth 2 % PADS 6 each, 6 each, Topical, Daily, Prescott Gum, Collier Salina, MD, 6 each at 03/29/19 1030 .  clopidogrel (PLAVIX) tablet 75 mg, 75 mg, Oral, Daily, Prescott Gum, Collier Salina, MD .  docusate sodium (COLACE) capsule 200 mg, 200 mg, Oral, Daily, Roddenberry, Myron G, PA-C, 200 mg at 03/29/19 0834 .  ferrous [id number] C-folic acid (TRINSICON / FOLTRIN) capsule 1 capsule, 1 capsule, Oral, BID PC, Prescott Gum, Collier Salina, MD .  Derrill Memo ON 03/31/2019] furosemide (LASIX) tablet 40 mg, 40 mg, Oral, Daily, Prescott Gum, Collier Salina, MD .  gabapentin (NEURONTIN) capsule 300 mg, 300 mg, Oral, BID, Melrose Nakayama, MD, 300 mg at 03/29/19 2235 .  glipiZIDE (GLUCOTROL) tablet 10 mg, 10 mg, Oral, BID AC, Melrose Nakayama, MD, 10 mg at 03/30/19 2395 .  guaiFENesin (MUCINEX) 12 hr tablet 600 mg, 600 mg, Oral, BID, Prescott Gum, Collier Salina, MD, 600 mg at 03/29/19 2235 .  influenza vac split quadrivalent PF (FLUARIX) injection 0.5 mL, 0.5 mL, Intramuscular, Prior to discharge, Burnell Blanks, MD .  insulin aspart (novoLOG) injection 0-15 Units, 0-15 Units, Subcutaneous,  TID WC, Prescott Gum, Collier Salina, MD, 3 Units at 03/30/19 0703 .  insulin aspart (novoLOG) injection 0-5 Units, 0-5 Units, Subcutaneous, QHS, Prescott Gum, Peter, MD .  insulin detemir (LEVEMIR) injection 10 Units, 10 Units, Subcutaneous, BID, Melrose Nakayama, MD, 10 Units at 03/29/19 2236 .  MEDLINE mouth rinse, 15 mL, Mouth Rinse, BID, Prescott Gum, Collier Salina, MD, 15 mL at 03/29/19 2243 .  metoprolol tartrate (LOPRESSOR) tablet 25 mg, 25 mg, Oral, BID, 25 mg at 03/29/19 2235 **OR** [DISCONTINUED] metoprolol tartrate (LOPRESSOR) 25 mg/10 mL oral suspension 12.5 mg, 12.5 mg, Per Tube, BID, Prescott Gum, Collier Salina, MD .  mometasone-formoterol [facility]) 100-5 MCG/ACT inhaler 2 puff, 2 puff, Inhalation, BID, Prescott Gum, Collier Salina, MD, 2 puff at 03/30/19 0844 .  nicotine (NICODERM CQ - dosed in mg/24 hours) patch 14 mg, 14 mg, Transdermal, Daily, Prescott Gum, Collier Salina, MD, 14 mg at 03/29/19 0843 .  ondansetron (ZOFRAN) injection 4 mg, 4 mg, Intravenous, Q6H PRN, Roddenberry, Myron G, PA-C, 4 mg at 03/28/19 0352 .  oxyCODONE (Oxy IR/ROXICODONE) immediate release tablet 5-10 mg, 5-10 mg, Oral, Q3H PRN, Antony Odea, PA-C, 10 mg at 03/30/19 0835 .  pantoprazole (PROTONIX) EC tablet 40 mg,  40 mg, Oral, Daily, Antony Odea, PA-C, 40 mg at 03/29/19 8182 .  pneumococcal 23 valent vaccine (PNU-IMMUNE) injection 0.5 mL, 0.5 mL, Intramuscular, Prior to discharge, Burnell Blanks, MD .  potassium chloride SA (KLOR-CON) CR tablet 20 mEq, 20 mEq, Oral, Q4H, Prescott Gum, Collier Salina, MD, 20 mEq at 03/30/19 0728 .  sodium chloride flush (NS) 0.9 % injection 10-40 mL, 10-40 mL, Intracatheter, Q12H, Prescott Gum, Collier Salina, MD, 10 mL at 03/29/19 2242 .  sodium chloride flush (NS) 0.9 % injection 10-40 mL, 10-40 mL, Intracatheter, PRN, Prescott Gum, Peter, MD .  sodium chloride flush (NS) 0.9 % injection 3 mL, 3 mL, Intravenous, Q12H, Roddenberry, Myron G, PA-C, 3 mL at 03/29/19 0849 .  sodium chloride flush (NS) 0.9 % injection 3 mL, 3 mL,  Intravenous, PRN, Roddenberry, Myron G, PA-C .  traMADol (ULTRAM) tablet 50-100 mg, 50-100 mg, Oral, Q4H PRN, Roddenberry, Myron G, PA-C, 100 mg at 03/30/19 0531  Patients Current Diet:     Diet Order                  Diet heart healthy/carb modified Room service appropriate? Yes; Fluid consistency: Thin  Diet effective now               Precautions / Restrictions Precautions Precautions: Sternal Precaution Booklet Issued: No Precaution Comments: pt educated on sternal precautions, also noted to have R hemiparesis Restrictions Weight Bearing Restrictions: Yes(sternal precautions) Other Position/Activity Restrictions: sternal precautions   Has the patient had 2 or more falls or a fall with injury in the past year? No  Prior Activity Level Community (5-7x/wk): Independent; driving, delivered pizzas, rode horses, lived alone  Prior Functional Level Self Care: Did the patient need help bathing, dressing, using the toilet or eating? Independent  Indoor Mobility: Did the patient need assistance with walking from room to room (with or without device)? Independent  Stairs: Did the patient need assistance with internal or external stairs (with or without device)? Independent  Functional Cognition: Did the patient need help planning regular tasks such as shopping or remembering to take medications? Independent  Home Assistive Devices / Equipment Home Assistive Devices/Equipment: CBG Meter, Eyeglasses  Prior Device Use: Indicate devices/aids used by the patient prior to current illness, exacerbation or injury? None of the above  Current Functional Level Cognition  Arousal/Alertness: Awake/alert Overall Cognitive Status: Impaired/Different from baseline Current Attention Level: Sustained Orientation Level: Oriented X4 Safety/Judgement: Decreased awareness of safety, Decreased awareness of deficits General Comments: impulsive Attention: Sustained Sustained  Attention: Appears intact Memory: Impaired Memory Impairment: Retrieval deficit Awareness: Appears intact Problem Solving: Impaired Problem Solving Impairment: Verbal complex Executive Function: Writer: Impaired Organizing Impairment: Verbal complex Safety/Judgment: Appears intact    Extremity Assessment (includes Sensation/Coordination)  Upper Extremity Assessment: RUE deficits/detail RUE Deficits / Details: grossly 2/5 shoulder and elbow, 0/5 wrist and hand; unable to use functionally and cueing to avoid compensatory techniques of trunk  RUE Coordination: decreased fine motor, decreased gross motor  Lower Extremity Assessment: Defer to PT evaluation RLE Deficits / Details: grossly 4-/5 RLE Coordination: decreased gross motor, decreased fine motor(difficulty sequencing during std pvt transfer)    ADLs  Overall ADL's : Needs assistance/impaired Grooming: Moderate assistance, Sitting Upper Body Bathing: Sitting, Moderate assistance Lower Body Bathing: +2 for physical assistance, +2 for safety/equipment, Maximal assistance, Sit to/from stand Upper Body Dressing : Maximal assistance, Sitting Lower Body Dressing: Maximal assistance, +2 for physical assistance, Sit to/from stand Toilet Transfer: Moderate assistance, Minimal assistance, +  2 for safety/equipment, Ambulation Functional mobility during ADLs: Moderate assistance, Minimal assistance, Cueing for safety, Cueing for sequencing General ADL Comments: pt limited by cognition, R hemi, sternal precautions and decreased activity tolerance    Mobility  Overal bed mobility: (pt up in recliner upon arrival) Bed Mobility: Rolling, Sidelying to Sit Rolling: Min assist Sidelying to sit: Mod assist General bed mobility comments: up in chair    Transfers  Overall transfer level: Needs assistance Equipment used: Rolling walker (2 wheeled) Transfers: Sit to/from Stand Sit to Stand: Min guard Stand pivot transfers:  Mod assist General transfer comment: mod assist progressing to min assist during transfers, cueing for sternal precautions and technique     Ambulation / Gait / Stairs / Wheelchair Mobility  Ambulation/Gait Ambulation/Gait assistance: Herbalist (Feet): 40 Feet(additional trials of 15 and 25') Assistive device: Rolling walker (2 wheeled) Gait Pattern/deviations: Step-to pattern, Decreased step length - right, Decreased step length - left, Decreased dorsiflexion - right General Gait Details: pt with intermittent R knee hyperextension during stance phase, one instance of R knee buckling. Pt with short and shuffling steps Gait velocity: decreased Gait velocity interpretation: <1.8 ft/sec, indicate of risk for recurrent falls    Posture / Balance Dynamic Sitting Balance Sitting balance - Comments: supervision Balance Overall balance assessment: Needs assistance Sitting-balance support: Feet supported, No upper extremity supported Sitting balance-Leahy Scale: Good Sitting balance - Comments: supervision Standing balance support: Bilateral upper extremity supported Standing balance-Leahy Scale: Fair Standing balance comment: minG    Special needs/care consideration BiPAP/CPAP n/a CPM  N/a Continuous Drip IV PICC double lumen left basilic 18/8 Dialysis n/a Life Vest  N/a Oxygen O2 at 2 liters [place]; non pta Special Bed  N/a Trach Size  N/a Wound Vac n/a Skin pacing wire sites anterior chest; surgical incision to chest and to left leg Bowel mgmt: continent LBM 11/4 Bladder mgmt: incontinent ; external catheter Diabetic mgmt: n/a Behavioral consideration easily distracted and off subject Chemo/radiation  N/a Designated visitor is ex husband Fara Olden   Previous Home Environment   [place] 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) Home Layout: One level Home Access: Stairs to enter Entrance Stairs-Rails: Can reach both Technical brewer of Steps: 2 Bathroom Shower/Tub: Chiropodist: [place]: No   Discharge Living Setting Plans for Discharge Living Setting: Lives with (comment)(to go stay temporarily to exhusband's home with adult childr) Type of Home at Discharge: House Discharge Home Layout: One level Discharge Home Access: Stairs to enter Entrance Stairs-Rails: Right, Left, Can reach both Entrance Stairs-Number of Steps: 2 Discharge Bathroom Shower/Tub: Tub/shower unit, Curtain(has shower bench with back and hand held shower) Discharge Bathroom Toilet: Standard Discharge Bathroom Accessibility: Yes How Accessible: Accessible via walker Does the patient have any problems obtaining your medications?: Yes (Describe)(uninsured)  Social/Family/Support Systems Patient Roles: Partner, Parent(has "friend" Sonia Side, [place], Fara Olden and adult son Thurmond Butts) Sport and exercise psychologist Information: Fara Olden, exhusband main contact Anticipated Caregiver: Fara Olden and adult children Anticipated Caregiver's Contact Information: Fara Olden [phone] Ability/Limitations of Caregiver: none Caregiver Availability: 24/7 Discharge Plan Discussed with Primary Caregiver: No Is Caregiver In Agreement with Plan?: No Does Caregiver/Family have Issues with Lodging/Transportation while Pt is in Rehab?: No  Goals/Additional Needs Patient/Family Goal for Rehab: Mod I to supervision  PT, supervision to min OT, supervision SLP Expected length of stay: ELOS 7 to 10 days Pt/Family Agrees to Admission and willing to participate: Yes Program Orientation Provided & Reviewed with Pt/Caregiver Including Roles  & Responsibilities: Yes  Decrease burden of Care through IP rehab admission: n/a  Possible need  for SNF placement upon discharge: not anticipated  Patient Condition: I have reviewed medical records from [facility], spoken with patient. I met with patient at the bedside for inpatient rehabilitation assessment.  Patient will benefit from ongoing PT, OT and SLP, can actively participate in 3 hours of therapy a day 5 days of the week, and can make measurable gains during the admission.  Patient will also benefit from the coordinated team approach during an Inpatient Acute Rehabilitation admission.  The patient will receive intensive therapy as well as Rehabilitation physician, nursing, social worker, and care management interventions.  Due to bladder management, bowel management, safety, skin/wound care, disease management, medication administration, pain management and patient education the patient requires 24 hour a day rehabilitation nursing.  The patient is currently min assist with mobility and basic ADLs.  Discharge setting and therapy post discharge at home with home health is anticipated.  Patient has agreed to participate in the Acute Inpatient Rehabilitation Program and will admit today.  Preadmission Screen Completed By:  Cleatrice Burke, 03/30/2019 10:21 AM ______________________________________________________________________   Discussed status with Dr. Posey Pronto  on  03/30/2019 at  1034 and received approval for admission today.  Admission Coordinator:  Cleatrice Burke, RN, time  [id number] Date  03/30/2019   Assessment/Plan: Diagnosis: left centrum semi ovale/corona radiata infarct  1. Does the need for close, 24 hr/day Medical supervision in concert with the patient's rehab needs make it unreasonable for this patient to be served in a less intensive setting? Potentially  2. Co-Morbidities requiring supervision/potential complications: diabetes mellitus, tobacco abuse and hyperlipidemia as well as morbid obesity with BMI 32.98 3. Due to safety, disease management,  medication administration, pain management and patient education, does the patient require 24 hr/day rehab nursing? Potentially 4. Does the patient require coordinated care of a physician, rehab nurse, PT, OT, and SLP to address physical and functional deficits in the context of the above medical diagnosis(es)? Potentially Addressing deficits in the following areas: balance, endurance, locomotion, strength, transferring, bathing, dressing, toileting, cognition and psychosocial support 5. Can the patient actively participate in an intensive therapy program of at least 3 hrs of therapy 5 days a week? Yes 6. The potential for patient to make measurable gains while on inpatient rehab is good 7. Anticipated functional outcomes upon discharge from inpatient rehab: supervision PT, supervision and min assist OT, supervision SLP 8. Estimated rehab length of stay to reach the above functional goals is: 7-10 days. 9. Anticipated discharge destination: Home 10. Overall Rehab/Functional Prognosis: good   MD Signature: Delice Lesch, MD, ABPMR        Revision History

## 2019-03-30 NOTE — Plan of Care (Signed)
Transfering to inpatient rehabilitation for continued therapies.  Discussed sugar control for optimal healing, rehabilitation, exercise programs and smoking cessation. Has nictoderm patch for assistance.

## 2019-03-30 NOTE — Progress Notes (Signed)
Report called to [facility] on 4 M. Discharge order placed for patent. Kristen from cardiac rehab, speaking to patient. Undecided on flu and pneumonia vaccines.

## 2019-03-30 NOTE — IPOC Note (Signed)
Individualized overall Plan of Care [facility]) Patient Details Name: Joan Webb MRN: [id number] DOB: 1970/08/02  Admitting Diagnosis: Acute cerebral infarction [facility])  Hospital Problems: Principal Problem:   Acute cerebral infarction [facility]) Active Problems:   Subcortical infarction ([place])   Constipation     Functional Problem List: Nursing Pain, Bladder, Bowel, Edema  PT Balance, Behavior, Endurance, Motor, Pain, Safety, Sensory  OT Balance, Cognition, Edema, Endurance, Motor, Pain, Safety, Sensory  SLP Cognition  TR         Basic ADL's: OT Grooming, Bathing, Dressing, Toileting     Advanced  ADL's: OT       Transfers: PT Bed Mobility, Bed to Chair, Car, Manufacturing systems engineer, Metallurgist: PT Ambulation, Stairs     Additional Impairments: OT Fuctional Use of Upper Extremity  SLP Social Cognition   Memory, Problem Solving, Attention, Awareness  TR      Anticipated Outcomes Item Anticipated Outcome  Self Feeding n/a  Swallowing      Basic self-care  mod I  Toileting  mod I   Bathroom Transfers mod I  Bowel/Bladder  to be continent x 2 LBM not documented  Transfers  S for basic and car  Locomotion  S for gait x 150' wiht LRAD, CGA for up/down 12 steps 2 rails  Communication     Cognition  Supervision-Min A  Pain  less than 2 out of 10  Safety/Judgment  to remain fall free while in rehab   Therapy Plan: PT Intensity: Minimum of 1-2 x/day ,45 to 90 minutes PT Frequency: 5 out of 7 days PT Duration Estimated Length of Stay: 7 OT Intensity: Minimum of 1-2 x/day, 45 to 90 minutes OT Frequency: 5 out of 7 days OT Duration/Estimated Length of Stay: 10-12 days SLP Intensity: Minumum of 1-2 x/day, 30 to 90 minutes SLP Frequency: 3 to 5 out of 7 days SLP Duration/Estimated Length of Stay: 7-10 days    Team Interventions: Nursing Interventions Patient/Family Education, Disease Management/Prevention, Pain Management, Bowel Management,  Medication Management  PT interventions Ambulation/gait training, Discharge planning, Functional mobility training, Psychosocial support, Therapeutic Activities, Balance/vestibular training, Neuromuscular re-education, Therapeutic Exercise, Wheelchair propulsion/positioning, Cognitive remediation/compensation, DME/adaptive equipment instruction, Pain management, Splinting/orthotics, UE/LE Strength taining/ROM, Academic librarian, Barrister's clerk education, Technical sales engineer stimulation, IT trainer, UE/LE Coordination activities  OT Interventions Training and development officer, Discharge planning, Functional electrical stimulation, Pain management, Self Care/advanced ADL retraining, Therapeutic Activities, UE/LE Coordination activities, Cognitive remediation/compensation, Disease mangement/prevention, Functional mobility training, Patient/family education, Skin care/wound managment, Therapeutic Exercise, Community reintegration, Engineer, drilling, Neuromuscular re-education, Psychosocial support, UE/LE Strength taining/ROM  SLP Interventions Cognitive remediation/compensation, Internal/external aids, Functional tasks, English as a second language teacher, Patient/family education, Environmental controls  TR Interventions    SW/CM Interventions Discharge Planning, Psychosocial Support, Patient/Family Education   Barriers to Discharge MD  Medical stability, Wound care, Weight and Behavior  Nursing      PT      OT      SLP Behavior poor frustration tolerance, highly distractible  SW Medication compliance, Other (comments) Was not going to a MD and is uninsured   Team Discharge Planning: Destination: PT-Home(of ex-husband) ,OT- Home , SLP-Home Projected Follow-up: PT-Home health PT, OT-  Outpatient OT(cardiac rehab), SLP-24 hour supervision/assistance Projected Equipment Needs: PT- , OT- To be determined, SLP-None recommended by SLP Equipment Details: PT-may need RW, OT-has a shower  chair Patient/family involved in discharge planning: PT- Patient,  OT-Patient, SLP-Patient, Family member/caregiver  MD ELOS: 7-10 days. Medical Rehab Prognosis:  Good Assessment: 48 year old  right-handed female with history of diabetes mellitus with intermittent numbness in bilateral hands, tobacco abuse and hyperlipidemia as well as, obesity with BMI 32.98.  Presented 03/23/2019 with chest pain.  ECG performed showed inferior STEMI. Code STEMI was activated.  Noted sodium 131, glucose 338, troponin 24, WBC 14,300, SARS Covid negative.  Chest x-ray negative.  She was taken to the Cath Lab demonstrating severe multivessel coronary artery disease.  Underwent CABG x3 on 03/25/2019 per Dr. Maren Beach.  Hospital course complicated by pain and acute blood loss anemia of 8.3.  Sternal precautions as indicated.  On 03/27/2019 patient was found to have right arm weakness and numbness.  Cranial CT scan showed small focus of hypodensity within the left centrum semiovale/corona radiata.  CTA of the neck bilateral common carotid, internal carotid and vertebral arteries patent within the neck without significant stenosis.  CTA of head with no large vessel occlusion.  MRI was not able to be performed due to postsurgical staples.  Carotid Dopplers 1 to 39% stenosis.  Echocardiogram showed ejection fraction of 65%.  Patient's diet was slowly advanced to a regular consistency.  Epicardial wires removed 03/30/2019 and follow-up chest x-ray completed showing progressive bilateral pulmonary infiltrates/edema.  Progressive bibasilar atelectasis.  Patient remained on Lasix therapy as directed.. Neurology recommended patient be on ASA 81 and Plavix 75 x 3-week, then ASA alone.  Tolerating a regular consistency diet.  Patient with resulting functional deficits with mobility, transfers, self-care, cognition.  Will set goals for Mod I with PT, Supervision with OT and Supervision/Min A  with SLP.   Due to the current state of emergency,  patients may not be receiving their 3-hours of Medicare-mandated therapy.  See Team Conference Notes for weekly updates to the plan of care

## 2019-03-30 NOTE — H&P (Addendum)
Physical Medicine and Rehabilitation Admission H&P     HPI: Joan Webb is a 48 year old right-handed female with history of diabetes mellitus with intermittent numbness in bilateral hands, tobacco abuse and hyperlipidemia as well as obesity with BMI 32.98.  History taken from chart review, ex-husband, and patient.  Patient lives alone.  Independent prior to admission 1 level home with 2 steps to entry.  She does have children in the area who plan to provide assistance on discharge as well as an ex-husband.  Presented 03/23/2019 with chest pain.  ECG performed showed inferior STEMI. Code STEMI was activated.  Noted sodium 131, glucose 338, troponin 24, WBC 14,300, SARS Covid negative.  Chest x-ray negative.  She was taken to the Cath Lab demonstrating severe multivessel coronary artery disease.  Underwent CABG x3 on 03/25/2019 per Dr. Darcey Nora.  Hospital course complicated by pain and acute blood loss anemia of 8.3.  Sternal precautions as indicated.  On 03/27/2019 patient was found to have right arm weakness and numbness.  Cranial CT scan showed small focus of hypodensity within the left centrum semiovale/corona radiata.  CTA of the neck bilateral common carotid, internal carotid and vertebral arteries patent within the neck without significant stenosis.  CTA of head with no large vessel occlusion.  MRI was not able to be performed due to postsurgical staples.  Carotid Dopplers 1 to 39% stenosis.  Echocardiogram showed ejection fraction of 65%.  Patient's diet was slowly advanced to a regular consistency.  Epicardial wires removed 03/30/2019 and follow-up chest x-ray completed showing progressive bilateral pulmonary infiltrates/edema.  Progressive bibasilar atelectasis.  Patient remained on Lasix therapy as directed.. Neurology recommended patient be on ASA 81 and Plavix 75 x 3-week, then ASA alone.  Tolerating a regular consistency diet.  Therapy evaluations completed and patient was admitted for a  comprehensive rehab program. Please see preadmission assessment from earlier today as well.   Review of Systems  Constitutional: Negative for chills and fever.  HENT: Negative for hearing loss.   Eyes: Negative for blurred vision and double vision.  Respiratory: Positive for shortness of breath. Negative for cough.   Cardiovascular: Positive for chest pain, palpitations and leg swelling.  Gastrointestinal: Positive for constipation. Negative for heartburn, nausea and vomiting.  Genitourinary: Negative for dysuria, flank pain and hematuria.  Musculoskeletal: Positive for myalgias.  Skin: Negative for rash.  Neurological: Positive for sensory change and focal weakness. Negative for speech change.   Past Medical History:  Diagnosis Date   Anxiety    Coronary artery disease    Diabetes ([place])    Hyperlipidemia    Myocardial infarction ([place])    Tobacco abuse    Past Surgical History:  Procedure Laterality Date   CARDIAC CATHETERIZATION     CORONARY ARTERY BYPASS GRAFT N/A 03/25/2019   Procedure: CORONARY ARTERY BYPASS GRAFTING (CABG) X THREE, ON PUMP, USING LEFT INTERNAL MAMMARY ARTERY AND LEFT GREATER SAPHENOUS VEIN HARVESTED ENDOSCOPICALLY;  Surgeon: Ivin Poot, MD;  Location: [facility];  Service: Open Heart Surgery;  Laterality: N/A;   LEFT HEART CATH AND CORONARY ANGIOGRAPHY N/A 03/23/2019   Procedure: LEFT HEART CATH AND CORONARY ANGIOGRAPHY;  Surgeon: Burnell Blanks, MD;  Location: [facility];  Service: Cardiovascular;  Laterality: N/A;   None     TEE WITHOUT CARDIOVERSION N/A 03/25/2019   Procedure: TRANSESOPHAGEAL ECHOCARDIOGRAM (TEE);  Surgeon: Prescott Gum, Collier Salina, MD;  Location: [facility];  Service: Open Heart Surgery;  Laterality: N/A;   Family History  Problem  Relation Age of Onset   Heart attack Father    Social History:  reports that she has been smoking cigarettes. She has never used smokeless tobacco. She reports previous alcohol use. She reports  previous drug use. Allergies: No Known Allergies Medications Prior to Admission  Medication Sig Dispense Refill   aspirin 81 MG chewable tablet Chew 81 mg by mouth daily.     Cholecalciferol (QC VITAMIN D3) 125 MCG (5000 UT) TABS Take 1 tablet by mouth daily.     gabapentin (NEURONTIN) 300 MG capsule Take 300 mg by mouth 2 (two) times daily.     glipiZIDE (GLUCOTROL) 10 MG tablet Take 10 mg by mouth 2 (two) times daily before a meal.      ibuprofen (ADVIL) 800 MG tablet Take 800 mg by mouth 2 (two) times daily as needed for mild pain.     lisinopril (ZESTRIL) 2.5 MG tablet Take 5 mg by mouth daily.      lovastatin (MEVACOR) 20 MG tablet Take 20 mg by mouth at bedtime.      Omega 3 1000 MG CAPS Take 1,000 mg by mouth daily.     sitaGLIPtin-metformin (JANUMET) 50-1000 MG tablet Take 1 tablet by mouth 2 (two) times daily with a meal.      Drug Regimen Review Drug regimen was reviewed and remains appropriate with no significant issues identified  Home: Home Living Family/patient expects to be discharged to:: Private residence Living Arrangements: Alone(but plans to live with children at dc) Available Help at Discharge: Family, Available PRN/intermittently Type of Home: House Home Access: Stairs to enter Technical brewer of Steps: 2 Entrance Stairs-Rails: Can reach both Home Layout: One level Bathroom Shower/Tub: Chiropodist: Standard  Lives With: Alone   Functional History: Prior Function Level of Independence: Independent  Functional Status:  Mobility: Bed Mobility Overal bed mobility: (pt up in recliner upon arrival) Bed Mobility: Rolling, Sidelying to Sit Rolling: Min assist Sidelying to sit: Mod assist General bed mobility comments: up in chair Transfers Overall transfer level: Needs assistance Equipment used: Rolling walker (2 wheeled) Transfers: Sit to/from Stand Sit to Stand: Min guard Stand pivot transfers: Mod assist General  transfer comment: mod assist progressing to min assist during transfers, cueing for sternal precautions and technique  Ambulation/Gait Ambulation/Gait assistance: Min assist Gait Distance (Feet): 40 Feet(additional trials of 15 and 25') Assistive device: Rolling walker (2 wheeled) Gait Pattern/deviations: Step-to pattern, Decreased step length - right, Decreased step length - left, Decreased dorsiflexion - right General Gait Details: pt with intermittent R knee hyperextension during stance phase, one instance of R knee buckling. Pt with short and shuffling steps Gait velocity: decreased Gait velocity interpretation: <1.8 ft/sec, indicate of risk for recurrent falls    ADL: ADL Overall ADL's : Needs assistance/impaired Grooming: Moderate assistance, Sitting Upper Body Bathing: Sitting, Moderate assistance Lower Body Bathing: +2 for physical assistance, +2 for safety/equipment, Maximal assistance, Sit to/from stand Upper Body Dressing : Maximal assistance, Sitting Lower Body Dressing: Maximal assistance, +2 for physical assistance, Sit to/from stand Toilet Transfer: Moderate assistance, Minimal assistance, +2 for safety/equipment, Ambulation Functional mobility during ADLs: Moderate assistance, Minimal assistance, Cueing for safety, Cueing for sequencing General ADL Comments: pt limited by cognition, R hemi, sternal precautions and decreased activity tolerance  Cognition: Cognition Overall Cognitive Status: Impaired/Different from baseline Arousal/Alertness: Awake/alert Orientation Level: Oriented X4 Attention: Sustained Sustained Attention: Appears intact Memory: Impaired Memory Impairment: Retrieval deficit Immediate Memory Recall: Sock, Oren Binet, Bed Memory Recall Sock: Not able to  recall Memory Recall Blue: Not able to recall Memory Recall Bed: Not able to recall Awareness: Appears intact Problem Solving: Impaired Problem Solving Impairment: Verbal complex Executive Function:  Writer: Impaired Organizing Impairment: Verbal complex Safety/Judgment: Appears intact Cognition Arousal/Alertness: Awake/alert Behavior During Therapy: WFL for tasks assessed/performed Overall Cognitive Status: Impaired/Different from baseline Area of Impairment: Safety/judgement, Awareness, Problem solving Current Attention Level: Sustained Safety/Judgement: Decreased awareness of safety, Decreased awareness of deficits Awareness: Emergent Problem Solving: Slow processing, Difficulty sequencing, Requires verbal cues General Comments: impulsive  Physical Exam: Blood pressure (!) 110/58, pulse 74, temperature 98.1 F (36.7 C), temperature source Axillary, resp. rate 13, height '5\' 2"'$  (1.575 m), weight 81.8 kg, SpO2 93 %. Physical Exam  Vitals reviewed. Constitutional: She appears well-developed.  Obese  Eyes: EOM are normal. Right eye exhibits no discharge. Left eye exhibits no discharge.  Neck: No tracheal deviation present. No thyromegaly present.  Respiratory: Effort normal. No respiratory distress.  GI: She exhibits no distension.  Musculoskeletal:     Comments: Left hand edema No tenderness.  Neurological: She is alert.  Makes good eye contact.   Follows commands.   Oriented x3  She is a bit easily distracted. Motor: Left upper extremity/left lower extremity: 5/5 proximal distal Right upper extremity: Shoulder abduction elbow flexion/extension 4/5, wrist extension, handgrip 0/5 Right lower extremity: 4/5 proximal to distal  Skin:  Midline chest incision C/D/I  Psychiatric: Her mood appears anxious. Her speech is tangential.    Results for orders placed or performed during the hospital encounter of 03/23/19 (from the past 48 hour(s))  Glucose, capillary     Status: Abnormal   Collection Time: 03/28/19  7:34 AM  Result Value Ref Range   Glucose-Capillary 125 (H) 70 - 99 mg/dL  .Cooxemetry Panel (carboxy, met, total hgb, O2 sat)     Status: Abnormal    Collection Time: 03/28/19  8:20 AM  Result Value Ref Range   Total hemoglobin 9.0 (L) 12.0 - 16.0 g/dL   O2 Saturation 70.2 %   Carboxyhemoglobin 1.2 0.5 - 1.5 %   Methemoglobin 0.8 0.0 - 1.5 %    Comment: Performed at [facility], [place] [address], [place], [place] [address]  Glucose, capillary     Status: Abnormal   Collection Time: 03/28/19 11:19 AM  Result Value Ref Range   Glucose-Capillary 150 (H) 70 - 99 mg/dL  Glucose, capillary     Status: Abnormal   Collection Time: 03/28/19  3:27 PM  Result Value Ref Range   Glucose-Capillary 121 (H) 70 - 99 mg/dL  Glucose, capillary     Status: Abnormal   Collection Time: 03/28/19  7:43 PM  Result Value Ref Range   Glucose-Capillary 172 (H) 70 - 99 mg/dL  Glucose, capillary     Status: None   Collection Time: 03/28/19 11:36 PM  Result Value Ref Range   Glucose-Capillary 88 70 - 99 mg/dL  CBC     Status: Abnormal   Collection Time: 03/29/19  3:38 AM  Result Value Ref Range   WBC 9.3 4.0 - 10.5 K/uL   RBC 2.92 (L) 3.87 - 5.11 MIL/uL   Hemoglobin 8.5 (L) 12.0 - 15.0 g/dL   HCT 25.9 (L) 36.0 - 46.0 %   MCV 88.7 80.0 - 100.0 fL   MCH 29.1 26.0 - 34.0 pg   MCHC 32.8 30.0 - 36.0 g/dL   RDW 13.0 11.5 - 15.5 %   Platelets 266 150 - 400 K/uL   nRBC 0.0  0.0 - 0.2 %    Comment: Performed at [facility], [place] [address], [place], [place] [address]  Basic metabolic panel     Status: Abnormal   Collection Time: 03/29/19  3:38 AM  Result Value Ref Range   Sodium 135 135 - 145 mmol/L   Potassium 3.9 3.5 - 5.1 mmol/L   Chloride 98 98 - 111 mmol/L   CO2 27 22 - 32 mmol/L   Glucose, Bld 146 (H) 70 - 99 mg/dL   BUN 17 6 - 20 mg/dL   Creatinine, Ser 0.54 0.44 - 1.00 mg/dL   Calcium 8.5 (L) 8.9 - 10.3 mg/dL   GFR calc non Af Amer >60 >60 mL/min   GFR calc Af Amer >60 >60 mL/min   Anion gap 10 5 - 15    Comment: Performed at [facility], [place] [address], [place], [place] [address]  .Cooxemetry Panel (carboxy, met, total hgb,  O2 sat)     Status: Abnormal   Collection Time: 03/29/19  3:50 AM  Result Value Ref Range   Total hemoglobin 11.3 (L) 12.0 - 16.0 g/dL   O2 Saturation 50.2 %   Carboxyhemoglobin 1.3 0.5 - 1.5 %   Methemoglobin 1.0 0.0 - 1.5 %    Comment: Performed at [facility], [place] [address], [place], [place] [address]  Glucose, capillary     Status: Abnormal   Collection Time: 03/29/19  3:56 AM  Result Value Ref Range   Glucose-Capillary 128 (H) 70 - 99 mg/dL  .Cooxemetry Panel (carboxy, met, total hgb, O2 sat)     Status: None   Collection Time: 03/29/19  5:30 AM  Result Value Ref Range   Total hemoglobin 14.2 12.0 - 16.0 g/dL   O2 Saturation 60.3 %   Carboxyhemoglobin 1.2 0.5 - 1.5 %   Methemoglobin 0.9 0.0 - 1.5 %    Comment: Performed at [facility], [place] [address] [place], [place] [address]  Glucose, capillary     Status: Abnormal   Collection Time: 03/29/19  7:03 AM  Result Value Ref Range   Glucose-Capillary 173 (H) 70 - 99 mg/dL  Glucose, capillary     Status: Abnormal   Collection Time: 03/29/19 12:04 PM  Result Value Ref Range   Glucose-Capillary 215 (H) 70 - 99 mg/dL  Glucose, capillary     Status: Abnormal   Collection Time: 03/29/19  3:50 PM  Result Value Ref Range   Glucose-Capillary 161 (H) 70 - 99 mg/dL  Glucose, capillary     Status: Abnormal   Collection Time: 03/29/19  8:11 PM  Result Value Ref Range   Glucose-Capillary 173 (H) 70 - 99 mg/dL  Glucose, capillary     Status: Abnormal   Collection Time: 03/29/19 10:20 PM  Result Value Ref Range   Glucose-Capillary 146 (H) 70 - 99 mg/dL  CBC     Status: Abnormal   Collection Time: 03/30/19  5:18 AM  Result Value Ref Range   WBC 7.7 4.0 - 10.5 K/uL   RBC 2.86 (L) 3.87 - 5.11 MIL/uL   Hemoglobin 8.3 (L) 12.0 - 15.0 g/dL   HCT 25.2 (L) 36.0 - 46.0 %   MCV 88.1 80.0 - 100.0 fL   MCH 29.0 26.0 - 34.0 pg   MCHC 32.9 30.0 - 36.0 g/dL   RDW 12.9 11.5 - 15.5 %   Platelets 303 150 - 400 K/uL   nRBC 0.3 (H)  0.0 - 0.2 %  Comment: Performed at [facility], [place] [address], [place], [place] [address]   Dg Chest Port 1 View  Result Date: 03/29/2019 CLINICAL DATA:  CABG.  Sore chest. EXAM: PORTABLE CHEST 1 VIEW COMPARISON:  03/27/2019. FINDINGS: Interim removal of right IJ sheath. Left PICC line noted with tip over cavoatrial junction. Epicardial pacer wire noted over the left chest base. Prior CABG. Cardiomegaly. Bilateral pulmonary infiltrates/edema progressed from prior exam. Progression of bibasilar atelectasis. Small bilateral pleural effusions cannot be excluded. No pneumothorax. IMPRESSION: 1. Interim removal of right IJ sheath. Left PICC line noted with tip over cavoatrial junction. 2.  Prior CABG.  Cardiomegaly again noted. 3. Progressive bilateral pulmonary infiltrates/edema. Progressive bibasilar atelectasis. Small bilateral pleural effusions cannot be excluded. Electronically Signed   By: Marcello Moores  Register   On: 03/29/2019 06:16   [place] Ekg Site Rite  Result Date: 03/28/2019 If Site Rite image not attached, placement could not be confirmed due to current cardiac rhythm.   Medical Problem List and Plan: 1.  Right side weakness with numbness secondary to left subcortical lacunar infarction secondary to small vessel disease after cardiac bypass surgery 03/25/2019.  Sternal precautions  Admit to CIR 2.  Antithrombotics: -DVT/anticoagulation: SCDs.  -antiplatelet therapy: Aspirin 81 mg daily, Plavix 25 mg daily x3 weeks then aspirin alone 3. Pain Management: Neurontin 300 mg twice daily, oxycodone/Ultram as needed 4. Mood: Provide emotional support  -antipsychotic agents: N/A 5. Neuropsych: This patient is not fully capable of making decisions on her own behalf. 6. Skin/Wound Care: Routine skin checks 7. Fluids/Electrolytes/Nutrition: Routine in and outs.  CMP ordered for tomorrow a.m. 8.  Acute blood loss anemia.  CBC ordered for tomorrow a.m. 9.  Hypertension.  Lopressor 25 mg twice  daily, Lasix 40 mg  Daily  Monitor with increased mobility 10.  Diabetes mellitus with peripheral neuropathy.  Hemoglobin A1c 7.9.  Glucotrol 10 mg twice daily, Levemir 10 units twice daily.  Check blood sugars before meals and at bedtime  Monitor with increased mobility 11.  Tobacco abuse.  Continue NicoDerm patch as well as inhalers as directed.  Provide counseling 12.  Hyperlipidemia.  Lipitor 13.  Obesity.  BMI 32.98.  Dietary follow-up.  Encourage weight loss. 27.  Post stroke cognitive deficit  SLP ordered  Cathlyn Parsons, PA-C 03/30/2019   I have personally performed a face to face diagnostic evaluation, including, but not limited to relevant history and physical exam findings, of this patient and developed relevant assessment and plan.  Additionally, I have reviewed and concur with the physician assistant's documentation above.  Delice Lesch, MD, ABPMR  The patient's status has not changed. The original post admission physician evaluation remains appropriate, and any changes from the pre-admission screening or documentation from the acute chart are noted above.   Delice Lesch, MD, ABPMR

## 2019-03-30 NOTE — Progress Notes (Signed)
Pt arrived to unit, pt is alert and oriented, able to make needs known, partner in [place] who is contact person, informed of hospital policy and is aware. Pt has incision to midline chest with glue that is healing and incisions to left leg as well as graft to abd.

## 2019-03-30 NOTE — Progress Notes (Signed)
5 Days Post-Op Procedure(s) (LRB): CORONARY ARTERY BYPASS GRAFTING (CABG) X THREE, ON PUMP, USING LEFT INTERNAL MAMMARY ARTERY AND LEFT GREATER SAPHENOUS VEIN HARVESTED ENDOSCOPICALLY (N/A) TRANSESOPHAGEAL ECHOCARDIOGRAM (TEE) (N/A) Subjective: Ambulating but RUE remains weak Off O2 with adequate sats NSR Ready for DC to CIR Objective: Vital signs in last 24 hours: Temp:  [98 F (36.7 C)-98.3 F (36.8 C)] 98.3 F (36.8 C) (11/11 0735) Pulse Rate:  [74-96] 74 (11/11 0700) Cardiac Rhythm: Normal sinus rhythm (11/11 0400) Resp:  [10-19] 15 (11/11 0700) BP: (94-160)/(52-74) 160/57 (11/11 0700) SpO2:  [86 %-100 %] 93 % (11/11 0700) Weight:  [82.9 kg] 82.9 kg (11/11 0615)  Hemodynamic parameters for last 24 hours:  stable  Intake/Output from previous day: 11/10 0701 - 11/11 0700 In: 820.2 [P.O.:780; I.V.:40.2] Out: 2775 [Urine:2775] Intake/Output this shift: No intake/output data recorded.      Physical Exam  General: feels well HEENT: Normocephalic pupils equal , dentition adequate Neck: Supple without JVD, adenopathy, or bruit Chest: Clear to auscultation, symmetrical breath sounds, no rhonchi, no tenderness             or deformity. Incision clean, dry Cardiovascular: Regular rate and rhythm, no murmur, no gallop, peripheral pulses             palpable in all extremities Abdomen:  Soft, nontender, no palpable mass or organomegaly Extremities: Warm, well-perfused, no clubbing cyanosis edema or tenderness,              no venous stasis changes of the legs Rectal/GU: Deferred Neuro: unable to move R wrist, R hand Skin: Clean and dry without rash or ulceration   Lab Results: Recent Labs    03/29/19 0338 03/30/19 0518  WBC 9.3 7.7  HGB 8.5* 8.3*  HCT 25.9* 25.2*  PLT 266 303   BMET:  Recent Labs    03/29/19 0338 03/30/19 0518  NA 135 135  K 3.9 3.7  CL 98 96*  CO2 27 28  GLUCOSE 146* 137*  BUN 17 16  CREATININE 0.54 0.58  CALCIUM 8.5* 8.5*    PT/INR:  No results for input(s): LABPROT, INR in the last 72 hours. ABG    Component Value Date/Time   PHART 7.358 03/26/2019 0431   HCO3 21.5 03/26/2019 0431   TCO2 23 03/26/2019 0431   ACIDBASEDEF 3.0 (H) 03/26/2019 0431   O2SAT 60.3 03/29/2019 0530   CBG (last 3)  Recent Labs    03/29/19 2011 03/29/19 2220 03/30/19 0643  GLUCAP 173* 146* 169*    Assessment/Plan: S/P Procedure(s) (LRB): CORONARY ARTERY BYPASS GRAFTING (CABG) X THREE, ON PUMP, USING LEFT INTERNAL MAMMARY ARTERY AND LEFT GREATER SAPHENOUS VEIN HARVESTED ENDOSCOPICALLY (N/A) TRANSESOPHAGEAL ECHOCARDIOGRAM (TEE) (N/A) Transfer to CIR 81 asa and  75 mg plavix per neurology Cont po lasix, Kcl, oral iron for expected postop anemia  LOS: 7 days    Joan Webb 03/30/2019

## 2019-03-30 NOTE — PMR Pre-admission (Signed)
PMR Admission Coordinator Pre-Admission Assessment  Patient: Joan Webb is an 48 y.o., female MRN: [id number] DOB: 03/14/1971 Height: 5' 2" (157.5 cm) Weight: 82.9 kg  Insurance Information HMO:     PPO:      PCP:      IPA:      80/20:      OTHER:  PRIMARY: uninsured        I left two voicemail to financial counselor 11/10 and 11/11 to request Disability and Medicaid applications inquiry. [name] at [phone]  Medicaid Application Date:       Case Manager:  Disability Application Date:       Case Worker:   The "Data Collection Information Summary" for patients in Inpatient Rehabilitation Facilities with attached "Privacy Act Statement-Health Care Records" was provided and verbally reviewed with: N/A  Emergency Contact Information Contact Information    Name Relation Home Work Mobile   Boykin, Ryan Son   [phone]   Schuren, Richard Stepfather   [phone]   Schuren, Maryann Mother   [phone]      Current Medical History  Patient Admitting Diagnosis:  CVA s/p CABG  History of Present Illness: 48-year-old right-handed female with history of diabetes mellitus, tobacco abuse and hyperlipidemia as well as morbid obesity with BMI 32.98.  Presented 03/23/2019 with acute onset of chest pain.  EKG showed inferior ST elevation.  Code STEMI was activated.  Noted sodium 131, glucose 338, troponin 24, WBC 14,300, SARS Covid negative.  Chest x-ray negative.  She was taken to the Cath Lab demonstrating severe multivessel coronary artery disease.  Underwent CABG x3 03/25/2019 per Dr. Vantrigt.  Hospital course pain management.  Sternal precautions as indicated.  Acute blood loss anemia latest hemoglobin 8.3.  On 03/27/2019 patient with right arm weakness and numbness.  Cranial CT scan showed small focus of hypodensity within the left centrum semi ovale/corona radiata.  CTA of the neck bilateral common carotid, internal carotid and vertebral arteries patent within the neck without  significant stenosis.  CTA of head with no large vessel occlusion.  MRI of the brain not completed due to postsurgical staples.  Carotid Dopplers 1 to 39% stenosis.  Echocardiogram with ejection fraction of 65% no increase in right ventricular wall thickness.  Patient's diet was slowly advanced to a regular consistency.  Epicardial wires removed 03/30/2019 and follow-up chest x-ray completed showing progressive bilateral pulmonary infiltrates/edema.  Progressive bibasilar atelectasis.  Patient remained on Lasix therapy as directed..  Neurology follow-up patient to be maintained on aspirin 81 mg daily and Plavix 75 mg daily x3 weeks then aspirin alone.  Tolerating a regular consistency diet.   Complete NIHSS TOTAL: 6  Patient's medical record from [facility] has been reviewed by the rehabilitation admission coordinator and physician.  Past Medical History  Past Medical History:  Diagnosis Date  . Anxiety   . Coronary artery disease   . Diabetes (HCC)   . Hyperlipidemia   . Myocardial infarction (HCC)   . Tobacco abuse     Family History   family history includes Heart attack in her father.  Prior Rehab/Hospitalizations Has the patient had prior rehab or hospitalizations prior to admission? Yes  Has the patient had major surgery during 100 days prior to admission? Yes   Current Medications  Current Facility-Administered Medications:  .  0.45 % sodium chloride infusion, , Intravenous, Continuous PRN, Roddenberry, Myron G, PA-C, Stopped at 03/27/19 2300 .  0.9 %  sodium chloride infusion, 250 mL, Intravenous, Continuous, Roddenberry,   Myron G, PA-C .  0.9 %  sodium chloride infusion, , Intravenous, Continuous, Roddenberry, Arlis Porta, PA-C, Last Rate: 10 mL/hr at 03/25/19 1930 .  acetaminophen (TYLENOL) tablet 1,000 mg, 1,000 mg, Oral, Q6H, 1,000 mg at 03/30/19 0644 **OR** acetaminophen (TYLENOL) 160 MG/5ML solution 1,000 mg, 1,000 mg, Per Tube, Q6H, Roddenberry, Myron G, PA-C, 1,000  mg at 03/25/19 2346 .  aspirin EC tablet 81 mg, 81 mg, Oral, Daily, Prescott Gum, Collier Salina, MD .  atorvastatin (LIPITOR) tablet 80 mg, 80 mg, Oral, q1800, Sherren Mocha, MD, 80 mg at 03/29/19 1703 .  bisacodyl (DULCOLAX) EC tablet 10 mg, 10 mg, Oral, Daily, 10 mg at 03/29/19 0834 **OR** bisacodyl (DULCOLAX) suppository 10 mg, 10 mg, Rectal, Daily, Roddenberry, Myron G, PA-C .  Chlorhexidine Gluconate Cloth 2 % PADS 6 each, 6 each, Topical, Daily, Prescott Gum, Collier Salina, MD, 6 each at 03/29/19 1030 .  clopidogrel (PLAVIX) tablet 75 mg, 75 mg, Oral, Daily, Prescott Gum, Collier Salina, MD .  docusate sodium (COLACE) capsule 200 mg, 200 mg, Oral, Daily, Roddenberry, Myron G, PA-C, 200 mg at 03/29/19 0834 .  ferrous [id number] C-folic acid (TRINSICON / FOLTRIN) capsule 1 capsule, 1 capsule, Oral, BID PC, Prescott Gum, Collier Salina, MD .  Derrill Memo ON 03/31/2019] furosemide (LASIX) tablet 40 mg, 40 mg, Oral, Daily, Prescott Gum, Collier Salina, MD .  gabapentin (NEURONTIN) capsule 300 mg, 300 mg, Oral, BID, Melrose Nakayama, MD, 300 mg at 03/29/19 2235 .  glipiZIDE (GLUCOTROL) tablet 10 mg, 10 mg, Oral, BID AC, Melrose Nakayama, MD, 10 mg at 03/30/19 1856 .  guaiFENesin (MUCINEX) 12 hr tablet 600 mg, 600 mg, Oral, BID, Prescott Gum, Collier Salina, MD, 600 mg at 03/29/19 2235 .  influenza vac split quadrivalent PF (FLUARIX) injection 0.5 mL, 0.5 mL, Intramuscular, Prior to discharge, Burnell Blanks, MD .  insulin aspart (novoLOG) injection 0-15 Units, 0-15 Units, Subcutaneous, TID WC, Prescott Gum, Collier Salina, MD, 3 Units at 03/30/19 0703 .  insulin aspart (novoLOG) injection 0-5 Units, 0-5 Units, Subcutaneous, QHS, Prescott Gum, Peter, MD .  insulin detemir (LEVEMIR) injection 10 Units, 10 Units, Subcutaneous, BID, Melrose Nakayama, MD, 10 Units at 03/29/19 2236 .  MEDLINE mouth rinse, 15 mL, Mouth Rinse, BID, Prescott Gum, Collier Salina, MD, 15 mL at 03/29/19 2243 .  metoprolol tartrate (LOPRESSOR) tablet 25 mg, 25 mg, Oral, BID, 25 mg at 03/29/19  2235 **OR** [DISCONTINUED] metoprolol tartrate (LOPRESSOR) 25 mg/10 mL oral suspension 12.5 mg, 12.5 mg, Per Tube, BID, Prescott Gum, Collier Salina, MD .  mometasone-formoterol [facility]) 100-5 MCG/ACT inhaler 2 puff, 2 puff, Inhalation, BID, Prescott Gum, Collier Salina, MD, 2 puff at 03/30/19 0844 .  nicotine (NICODERM CQ - dosed in mg/24 hours) patch 14 mg, 14 mg, Transdermal, Daily, Prescott Gum, Collier Salina, MD, 14 mg at 03/29/19 0843 .  ondansetron (ZOFRAN) injection 4 mg, 4 mg, Intravenous, Q6H PRN, Roddenberry, Myron G, PA-C, 4 mg at 03/28/19 0352 .  oxyCODONE (Oxy IR/ROXICODONE) immediate release tablet 5-10 mg, 5-10 mg, Oral, Q3H PRN, Antony Odea, PA-C, 10 mg at 03/30/19 0835 .  pantoprazole (PROTONIX) EC tablet 40 mg, 40 mg, Oral, Daily, Roddenberry, Arlis Porta, PA-C, 40 mg at 03/29/19 3149 .  pneumococcal 23 valent vaccine (PNU-IMMUNE) injection 0.5 mL, 0.5 mL, Intramuscular, Prior to discharge, Burnell Blanks, MD .  potassium chloride SA (KLOR-CON) CR tablet 20 mEq, 20 mEq, Oral, Q4H, Prescott Gum, Collier Salina, MD, 20 mEq at 03/30/19 0728 .  sodium chloride flush (NS) 0.9 % injection 10-40 mL, 10-40 mL, Intracatheter, Q12H,  Van Trigt, Peter, MD, 10 mL at 03/29/19 2242 .  sodium chloride flush (NS) 0.9 % injection 10-40 mL, 10-40 mL, Intracatheter, PRN, Van Trigt, Peter, MD .  sodium chloride flush (NS) 0.9 % injection 3 mL, 3 mL, Intravenous, Q12H, Roddenberry, Myron G, PA-C, 3 mL at 03/29/19 0849 .  sodium chloride flush (NS) 0.9 % injection 3 mL, 3 mL, Intravenous, PRN, Roddenberry, Myron G, PA-C .  traMADol (ULTRAM) tablet 50-100 mg, 50-100 mg, Oral, Q4H PRN, Roddenberry, Myron G, PA-C, 100 mg at 03/30/19 0531  Patients Current Diet:  Diet Order            Diet heart healthy/carb modified Room service appropriate? Yes; Fluid consistency: Thin  Diet effective now              Precautions / Restrictions Precautions Precautions: Sternal Precaution Booklet Issued: No Precaution Comments: pt educated on  sternal precautions, also noted to have R hemiparesis Restrictions Weight Bearing Restrictions: Yes(sternal precautions) Other Position/Activity Restrictions: sternal precautions   Has the patient had 2 or more falls or a fall with injury in the past year? No  Prior Activity Level Community (5-7x/wk): Independent; driving, delivered pizzas, rode horses, lived alone  Prior Functional Level Self Care: Did the patient need help bathing, dressing, using the toilet or eating? Independent  Indoor Mobility: Did the patient need assistance with walking from room to room (with or without device)? Independent  Stairs: Did the patient need assistance with internal or external stairs (with or without device)? Independent  Functional Cognition: Did the patient need help planning regular tasks such as shopping or remembering to take medications? Independent  Home Assistive Devices / Equipment Home Assistive Devices/Equipment: CBG Meter, Eyeglasses  Prior Device Use: Indicate devices/aids used by the patient prior to current illness, exacerbation or injury? None of the above  Current Functional Level Cognition  Arousal/Alertness: Awake/alert Overall Cognitive Status: Impaired/Different from baseline Current Attention Level: Sustained Orientation Level: Oriented X4 Safety/Judgement: Decreased awareness of safety, Decreased awareness of deficits General Comments: impulsive Attention: Sustained Sustained Attention: Appears intact Memory: Impaired Memory Impairment: Retrieval deficit Awareness: Appears intact Problem Solving: Impaired Problem Solving Impairment: Verbal complex Executive Function: Organizing Organizing: Impaired Organizing Impairment: Verbal complex Safety/Judgment: Appears intact    Extremity Assessment (includes Sensation/Coordination)  Upper Extremity Assessment: RUE deficits/detail RUE Deficits / Details: grossly 2/5 shoulder and elbow, 0/5 wrist and hand; unable to  use functionally and cueing to avoid compensatory techniques of trunk  RUE Coordination: decreased fine motor, decreased gross motor  Lower Extremity Assessment: Defer to PT evaluation RLE Deficits / Details: grossly 4-/5 RLE Coordination: decreased gross motor, decreased fine motor(difficulty sequencing during std pvt transfer)    ADLs  Overall ADL's : Needs assistance/impaired Grooming: Moderate assistance, Sitting Upper Body Bathing: Sitting, Moderate assistance Lower Body Bathing: +2 for physical assistance, +2 for safety/equipment, Maximal assistance, Sit to/from stand Upper Body Dressing : Maximal assistance, Sitting Lower Body Dressing: Maximal assistance, +2 for physical assistance, Sit to/from stand Toilet Transfer: Moderate assistance, Minimal assistance, +2 for safety/equipment, Ambulation Functional mobility during ADLs: Moderate assistance, Minimal assistance, Cueing for safety, Cueing for sequencing General ADL Comments: pt limited by cognition, R hemi, sternal precautions and decreased activity tolerance    Mobility  Overal bed mobility: (pt up in recliner upon arrival) Bed Mobility: Rolling, Sidelying to Sit Rolling: Min assist Sidelying to sit: Mod assist General bed mobility comments: up in chair    Transfers  Overall transfer level: Needs assistance Equipment used: Rolling   walker (2 wheeled) Transfers: Sit to/from Stand Sit to Stand: Min guard Stand pivot transfers: Mod assist General transfer comment: mod assist progressing to min assist during transfers, cueing for sternal precautions and technique     Ambulation / Gait / Stairs / Wheelchair Mobility  Ambulation/Gait Ambulation/Gait assistance: Herbalist (Feet): 40 Feet(additional trials of 15 and 25') Assistive device: Rolling walker (2 wheeled) Gait Pattern/deviations: Step-to pattern, Decreased step length - right, Decreased step length - left, Decreased dorsiflexion - right General Gait  Details: pt with intermittent R knee hyperextension during stance phase, one instance of R knee buckling. Pt with short and shuffling steps Gait velocity: decreased Gait velocity interpretation: <1.8 ft/sec, indicate of risk for recurrent falls    Posture / Balance Dynamic Sitting Balance Sitting balance - Comments: supervision Balance Overall balance assessment: Needs assistance Sitting-balance support: Feet supported, No upper extremity supported Sitting balance-Leahy Scale: Good Sitting balance - Comments: supervision Standing balance support: Bilateral upper extremity supported Standing balance-Leahy Scale: Fair Standing balance comment: minG    Special needs/care consideration BiPAP/CPAP n/a CPM  N/a Continuous Drip IV PICC double lumen left basilic 53/2 Dialysis n/a Life Vest  N/a Oxygen O2 at 2 liters [place]; non pta Special Bed  N/a Trach Size  N/a Wound Vac n/a Skin pacing wire sites anterior chest; surgical incision to chest and to left leg Bowel mgmt: continent LBM 11/4 Bladder mgmt: incontinent ; external catheter Diabetic mgmt: n/a Behavioral consideration easily distracted and off subject Chemo/radiation  N/a Designated visitor is ex husband Fara Olden   Previous Home Environment   [place] 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) Home Layout: One level Home Access: Stairs to enter Entrance Stairs-Rails: Can reach both Technical brewer of Steps: 2 Bathroom Shower/Tub: Chiropodist: Rosita: No   Discharge Living Setting Plans for Discharge Living Setting: Lives with (comment)(to go stay temporarily to exhusband's home with adult childr) Type of Home at  Discharge: House Discharge Home Layout: One level Discharge Home Access: Stairs to enter Entrance Stairs-Rails: Right, Left, Can reach both Entrance Stairs-Number of Steps: 2 Discharge Bathroom Shower/Tub: Tub/shower unit, Curtain(has shower bench with back and hand held shower) Discharge Bathroom Toilet: Standard Discharge Bathroom Accessibility: Yes How Accessible: Accessible via walker Does the patient have any problems obtaining your medications?: Yes (Describe)(uninsured)  Social/Family/Support Systems Patient Roles: Partner, Parent(has "friend" Sonia Side, [place], Fara Olden and adult son Thurmond Butts) Sport and exercise psychologist Information: Fara Olden, exhusband main contact Anticipated Caregiver: Fara Olden and adult children Anticipated Caregiver's Contact Information: Fara Olden [phone] Ability/Limitations of Caregiver: none Caregiver Availability: 24/7 Discharge Plan Discussed with Primary Caregiver: No Is Caregiver In Agreement with Plan?: No Does Caregiver/Family have Issues with Lodging/Transportation while Pt is in Rehab?: No  Goals/Additional Needs Patient/Family Goal for Rehab: Mod I to supervision PT, supervision to min OT, supervision SLP Expected length of stay: ELOS 7 to 10 days Pt/Family Agrees to Admission and willing to participate: Yes Program Orientation Provided & Reviewed with Pt/Caregiver Including Roles  & Responsibilities: Yes  Decrease burden of Care through IP rehab admission: n/a  Possible need for SNF placement upon discharge: not anticipated  Patient Condition: I have reviewed medical records from [facility], spoken with patient. I met with patient at the bedside for  inpatient rehabilitation assessment.  Patient will benefit from ongoing PT, OT and SLP, can actively participate in 3 hours of therapy a day 5 days of the week, and can make measurable gains during the admission.  Patient will also benefit from the coordinated team approach during an Inpatient Acute Rehabilitation admission.   The patient will receive intensive therapy as well as Rehabilitation physician, nursing, social worker, and care management interventions.  Due to bladder management, bowel management, safety, skin/wound care, disease management, medication administration, pain management and patient education the patient requires 24 hour a day rehabilitation nursing.  The patient is currently min assist with mobility and basic ADLs.  Discharge setting and therapy post discharge at home with home health is anticipated.  Patient has agreed to participate in the Acute Inpatient Rehabilitation Program and will admit today.  Preadmission Screen Completed By:  Boyette, Barbara Godwin, 03/30/2019 10:21 AM ______________________________________________________________________   Discussed status with Dr. Patel  on  03/30/2019 at  1034 and received approval for admission today.  Admission Coordinator:  Boyette, Barbara Godwin, RN, time  1034 Date  03/30/2019   Assessment/Plan: Diagnosis: left centrum semi ovale/corona radiata infarct  1. Does the need for close, 24 hr/day Medical supervision in concert with the patient's rehab needs make it unreasonable for this patient to be served in a less intensive setting? Potentially  2. Co-Morbidities requiring supervision/potential complications: diabetes mellitus, tobacco abuse and hyperlipidemia as well as morbid obesity with BMI 32.98 3. Due to safety, disease management, medication administration, pain management and patient education, does the patient require 24 hr/day rehab nursing? Potentially 4. Does the patient require coordinated care of a physician, rehab nurse, PT, OT, and SLP to address physical and functional deficits in the context of the above medical diagnosis(es)? Potentially Addressing deficits in the following areas: balance, endurance, locomotion, strength, transferring, bathing, dressing, toileting, cognition and psychosocial support 5. Can the patient actively  participate in an intensive therapy program of at least 3 hrs of therapy 5 days a week? Yes 6. The potential for patient to make measurable gains while on inpatient rehab is good 7. Anticipated functional outcomes upon discharge from inpatient rehab: supervision PT, supervision and min assist OT, supervision SLP 8. Estimated rehab length of stay to reach the above functional goals is: 7-10 days. 9. Anticipated discharge destination: Home 10. Overall Rehab/Functional Prognosis: good   MD Signature: Ankit Patel, MD, ABPMR 

## 2019-03-30 NOTE — Progress Notes (Signed)
  Speech Language Pathology Treatment: Dysphagia;Cognitive-Linquistic  Patient Details Name: Joan Webb MRN: [id number] DOB: Aug 17, 1970 Today's Date: 03/30/2019 Time: 1200-1229 SLP Time Calculation (min) (ACUTE ONLY): 29 min  Assessment / Plan / Recommendation Clinical Impression  Pt had already finished the solids on her lunch tray, but drank almost a full cup of thin liquids via straw, bites of ice cream, and took meds whole with a liquid wash without any overt difficulty. Education was provided about aspiration precautions and the rationale for swallow evaluation as pt was talking about how confused she was about being NPO after her stroke. Pt is distractible and tangential, needing Mod cues to redirect to conversation although she says that she is also quite distractible at baseline. Min cues were provided for verbal problem solving. Pt completed divergent naming tasks with mild difficulty as the category became more specific. She would benefit from evaluation of higher level thinking skills at next level of care.    HPI HPI: Joan Webb is a 48 y.o. female past medical history coronary artery disease status post coronary artery bypass graft postop day 2, anxiety, diabetes, hyperlipidemia, tobacco abuse, admitted in the cardiac ICU after coronary artery bypass grafting done 2 days ago started complaining of right arm weakness and numbness in both lower extremities.  Head CT revealed "Small focus of hypodensity within the left centrum semiovale/corona radiata suspicious for acute/subacute infarct."       SLP Plan  Continue with current plan of care       Recommendations  Diet recommendations: Regular;Thin liquid Liquids provided via: Cup;Straw Medication Administration: Whole meds with liquid Supervision: Patient able to self feed;Intermittent supervision to cue for compensatory strategies Compensations: Minimize environmental distractions;Slow rate;Small sips/bites Postural Changes  and/or Swallow Maneuvers: Seated upright 90 degrees                Oral Care Recommendations: Oral care BID Follow up Recommendations: Inpatient Rehab SLP Visit Diagnosis: Dysphagia, unspecified (R13.10);Cognitive communication deficit [phone]) Plan: Continue with current plan of care       [place] Mailynn Everly 03/30/2019, 1:04 PM  Pollyann Glen, M.A. [place] Acute Environmental education officer [phone] Office [phone]

## 2019-03-30 NOTE — Progress Notes (Signed)
Discussed IS, sternal precautions, smoking cessation, importance of DM diet, and CRPII with pt and husband. Receptive. She plans to quit smoking, staying on nicotine patches. Her husband will go outside. Will refer to [place]. Gave materials. [place], ACSM 3:32 PM 03/30/2019

## 2019-03-31 ENCOUNTER — Other Ambulatory Visit: Payer: Self-pay

## 2019-03-31 ENCOUNTER — Inpatient Hospital Stay (HOSPITAL_COMMUNITY): Payer: Self-pay

## 2019-03-31 ENCOUNTER — Inpatient Hospital Stay (HOSPITAL_COMMUNITY): Payer: Medicaid Other

## 2019-03-31 ENCOUNTER — Inpatient Hospital Stay (HOSPITAL_COMMUNITY): Payer: Self-pay | Admitting: Speech Pathology

## 2019-03-31 DIAGNOSIS — I639 Cerebral infarction, unspecified: Secondary | ICD-10-CM

## 2019-03-31 DIAGNOSIS — K5900 Constipation, unspecified: Secondary | ICD-10-CM

## 2019-03-31 DIAGNOSIS — E1142 Type 2 diabetes mellitus with diabetic polyneuropathy: Secondary | ICD-10-CM

## 2019-03-31 DIAGNOSIS — K5901 Slow transit constipation: Secondary | ICD-10-CM

## 2019-03-31 DIAGNOSIS — I10 Essential (primary) hypertension: Secondary | ICD-10-CM

## 2019-03-31 LAB — POCT I-STAT 7, (LYTES, BLD GAS, ICA,H+H)
Acid-Base Excess: 4 mmol/L — ABNORMAL HIGH (ref 0.0–2.0)
Bicarbonate: 28.8 mmol/L — ABNORMAL HIGH (ref 20.0–28.0)
Bicarbonate: 24.4 mmol/L (ref 20.0–28.0)
Calcium, Ion: 1.05 mmol/L — ABNORMAL LOW (ref 1.15–1.40)
Calcium, Ion: 1.1 mmol/L — ABNORMAL LOW (ref 1.15–1.40)
HCT: 26 % — ABNORMAL LOW (ref 36.0–46.0)
HCT: 28 % — ABNORMAL LOW (ref 36.0–46.0)
Hemoglobin: 8.8 g/dL — ABNORMAL LOW (ref 12.0–15.0)
Hemoglobin: 9.5 g/dL — ABNORMAL LOW (ref 12.0–15.0)
O2 Saturation: 100 %
O2 Saturation: 100 %
Potassium: 3.5 mmol/L (ref 3.5–5.1)
Potassium: 3.6 mmol/L (ref 3.5–5.1)
Sodium: 137 mmol/L (ref 135–145)
Sodium: 138 mmol/L (ref 135–145)
TCO2: 30 mmol/L (ref 22–32)
TCO2: 26 mmol/L (ref 22–32)
pCO2 arterial: 44.5 mmHg (ref 32.0–48.0)
pCO2 arterial: 39.4 mmHg (ref 32.0–48.0)
pH, Arterial: 7.419 (ref 7.350–7.450)
pH, Arterial: 7.399 (ref 7.350–7.450)
pO2, Arterial: 365 mmHg — ABNORMAL HIGH (ref 83.0–108.0)
pO2, Arterial: 230 mmHg — ABNORMAL HIGH (ref 83.0–108.0)

## 2019-03-31 LAB — POCT I-STAT, CHEM 8
BUN: 13 mg/dL (ref 6–20)
BUN: 11 mg/dL (ref 6–20)
BUN: 11 mg/dL (ref 6–20)
BUN: 11 mg/dL (ref 6–20)
BUN: 11 mg/dL (ref 6–20)
BUN: 8 mg/dL (ref 6–20)
Calcium, Ion: 1.21 mmol/L (ref 1.15–1.40)
Calcium, Ion: 1.01 mmol/L — ABNORMAL LOW (ref 1.15–1.40)
Calcium, Ion: 1.21 mmol/L (ref 1.15–1.40)
Calcium, Ion: 1.12 mmol/L — ABNORMAL LOW (ref 1.15–1.40)
Calcium, Ion: 1.11 mmol/L — ABNORMAL LOW (ref 1.15–1.40)
Calcium, Ion: 1.21 mmol/L (ref 1.15–1.40)
Chloride: 99 mmol/L (ref 98–111)
Chloride: 101 mmol/L (ref 98–111)
Chloride: 96 mmol/L — ABNORMAL LOW (ref 98–111)
Chloride: 100 mmol/L (ref 98–111)
Chloride: 102 mmol/L (ref 98–111)
Chloride: 107 mmol/L (ref 98–111)
Creatinine, Ser: 0.4 mg/dL — ABNORMAL LOW (ref 0.44–1.00)
Creatinine, Ser: 0.3 mg/dL — ABNORMAL LOW (ref 0.44–1.00)
Creatinine, Ser: 0.6 mg/dL (ref 0.44–1.00)
Creatinine, Ser: 0.4 mg/dL — ABNORMAL LOW (ref 0.44–1.00)
Creatinine, Ser: 0.4 mg/dL — ABNORMAL LOW (ref 0.44–1.00)
Creatinine, Ser: 0.3 mg/dL — ABNORMAL LOW (ref 0.44–1.00)
Glucose, Bld: 138 mg/dL — ABNORMAL HIGH (ref 70–99)
Glucose, Bld: 112 mg/dL — ABNORMAL HIGH (ref 70–99)
Glucose, Bld: 132 mg/dL — ABNORMAL HIGH (ref 70–99)
Glucose, Bld: 172 mg/dL — ABNORMAL HIGH (ref 70–99)
Glucose, Bld: 207 mg/dL — ABNORMAL HIGH (ref 70–99)
Glucose, Bld: 154 mg/dL — ABNORMAL HIGH (ref 70–99)
HCT: 33 % — ABNORMAL LOW (ref 36.0–46.0)
HCT: 32 % — ABNORMAL LOW (ref 36.0–46.0)
HCT: 46 % (ref 36.0–46.0)
HCT: 29 % — ABNORMAL LOW (ref 36.0–46.0)
HCT: 29 % — ABNORMAL LOW (ref 36.0–46.0)
HCT: 25 % — ABNORMAL LOW (ref 36.0–46.0)
Hemoglobin: 11.2 g/dL — ABNORMAL LOW (ref 12.0–15.0)
Hemoglobin: 10.9 g/dL — ABNORMAL LOW (ref 12.0–15.0)
Hemoglobin: 15.6 g/dL — ABNORMAL HIGH (ref 12.0–15.0)
Hemoglobin: 9.9 g/dL — ABNORMAL LOW (ref 12.0–15.0)
Hemoglobin: 9.9 g/dL — ABNORMAL LOW (ref 12.0–15.0)
Hemoglobin: 8.5 g/dL — ABNORMAL LOW (ref 12.0–15.0)
Potassium: 4.1 mmol/L (ref 3.5–5.1)
Potassium: 3.7 mmol/L (ref 3.5–5.1)
Potassium: 3.8 mmol/L (ref 3.5–5.1)
Potassium: 3.6 mmol/L (ref 3.5–5.1)
Potassium: 3.6 mmol/L (ref 3.5–5.1)
Potassium: 3.2 mmol/L — ABNORMAL LOW (ref 3.5–5.1)
Sodium: 137 mmol/L (ref 135–145)
Sodium: 137 mmol/L (ref 135–145)
Sodium: 137 mmol/L (ref 135–145)
Sodium: 136 mmol/L (ref 135–145)
Sodium: 138 mmol/L (ref 135–145)
Sodium: 142 mmol/L (ref 135–145)
TCO2: 27 mmol/L (ref 22–32)
TCO2: 25 mmol/L (ref 22–32)
TCO2: 27 mmol/L (ref 22–32)
TCO2: 26 mmol/L (ref 22–32)
TCO2: 24 mmol/L (ref 22–32)
TCO2: 19 mmol/L — ABNORMAL LOW (ref 22–32)

## 2019-03-31 LAB — CBC WITH DIFFERENTIAL/PLATELET
Abs Immature Granulocytes: 0.11 10*3/uL — ABNORMAL HIGH (ref 0.00–0.07)
Basophils Absolute: 0.1 10*3/uL (ref 0.0–0.1)
Basophils Relative: 1 %
Eosinophils Absolute: 0.2 10*3/uL (ref 0.0–0.5)
Eosinophils Relative: 2 %
HCT: 29.1 % — ABNORMAL LOW (ref 36.0–46.0)
Hemoglobin: 9.5 g/dL — ABNORMAL LOW (ref 12.0–15.0)
Immature Granulocytes: 1 %
Lymphocytes Relative: 18 %
Lymphs Abs: 1.5 10*3/uL (ref 0.7–4.0)
MCH: 28.5 pg (ref 26.0–34.0)
MCHC: 32.6 g/dL (ref 30.0–36.0)
MCV: 87.4 fL (ref 80.0–100.0)
Monocytes Absolute: 0.6 10*3/uL (ref 0.1–1.0)
Monocytes Relative: 7 %
Neutro Abs: 6.1 10*3/uL (ref 1.7–7.7)
Neutrophils Relative %: 71 %
Platelets: 343 10*3/uL (ref 150–400)
RBC: 3.33 MIL/uL — ABNORMAL LOW (ref 3.87–5.11)
RDW: 12.9 % (ref 11.5–15.5)
WBC: 8.5 10*3/uL (ref 4.0–10.5)
nRBC: 0.5 % — ABNORMAL HIGH (ref 0.0–0.2)

## 2019-03-31 LAB — COMPREHENSIVE METABOLIC PANEL
ALT: 40 U/L (ref 0–44)
AST: 35 U/L (ref 15–41)
Albumin: 3 g/dL — ABNORMAL LOW (ref 3.5–5.0)
Alkaline Phosphatase: 219 U/L — ABNORMAL HIGH (ref 38–126)
Anion gap: 13 (ref 5–15)
BUN: 13 mg/dL (ref 6–20)
CO2: 27 mmol/L (ref 22–32)
Calcium: 9 mg/dL (ref 8.9–10.3)
Chloride: 92 mmol/L — ABNORMAL LOW (ref 98–111)
Creatinine, Ser: 0.66 mg/dL (ref 0.44–1.00)
GFR calc Af Amer: >60 mL/min (ref >60)
GFR calc non Af Amer: >60 mL/min (ref >60)
Glucose, Bld: 258 mg/dL — ABNORMAL HIGH (ref 70–99)
Potassium: 4.1 mmol/L (ref 3.5–5.1)
Sodium: 132 mmol/L — ABNORMAL LOW (ref 135–145)
Total Bilirubin: 0.6 mg/dL (ref 0.3–1.2)
Total Protein: 6.4 g/dL — ABNORMAL LOW (ref 6.5–8.1)

## 2019-03-31 LAB — GLUCOSE, CAPILLARY
Glucose-Capillary: 109 mg/dL — ABNORMAL HIGH (ref 70–99)
Glucose-Capillary: 153 mg/dL — ABNORMAL HIGH (ref 70–99)
Glucose-Capillary: 150 mg/dL — ABNORMAL HIGH (ref 70–99)
Glucose-Capillary: 196 mg/dL — ABNORMAL HIGH (ref 70–99)

## 2019-03-31 MED ORDER — POLYETHYLENE GLYCOL 3350 17 G PO PACK
17.0000 g | PACK | Freq: Two times a day (BID) | ORAL | Status: DC
  Administered 2019-03-31 – 2019-04-09 (×19): 17 g via ORAL
  Filled 2019-03-31 (×18): qty 1

## 2019-03-31 MED ORDER — CHLORHEXIDINE GLUCONATE CLOTH 2 % EX PADS
6.0000 | MEDICATED_PAD | Freq: Two times a day (BID) | CUTANEOUS | Status: DC
  Administered 2019-03-31 – 2019-04-04 (×8): 6 via TOPICAL

## 2019-03-31 MED ORDER — SORBITOL 70 % PO SOLN
30.0000 mL | Freq: Once | ORAL | Status: DC
  Filled 2019-03-31: qty 30

## 2019-03-31 MED ORDER — SORBITOL 70 % SOLN
30.0000 mL | Freq: Once | Status: AC
  Administered 2019-03-31: 30 mL via ORAL

## 2019-03-31 NOTE — Progress Notes (Addendum)
KUB resulted, new orders in place. Pt usual bowel pattern at home was to go once every 3 to 4 days. Also pt has been incontinent and was using Purewick on previous unit. Initiated every 2 hours to toilet to encourage continent outcome.

## 2019-03-31 NOTE — Progress Notes (Signed)
Patient information reviewed and entered into eRehab System by Becky Tamu Golz, PPS coordinator. Information including medical coding, function ability, and quality indicators will be reviewed and updated through discharge.   

## 2019-03-31 NOTE — Evaluation (Addendum)
Physical Therapy Assessment and Plan  Patient Details  Name: Joan Webb MRN: [id number] Date of Birth: [birth year]  PT Diagnosis: Abnormality of gait, Cognitive deficits, Hemiparesis dominant, Hypertonia, Hypotonia and Impaired sensation Rehab Potential: Good ELOS: 7   Today's Date: 03/31/2019 PT Individual Time: 1045-1200 PT Individual Time Calculation (min): 75 min    Problem List:  Patient Active Problem List   Diagnosis Date Noted  . Constipation   . Subcortical infarction ([place]) 03/30/2019  . Acute cerebral infarction ([place])   . Acute blood loss anemia   . Class 1 obesity due to excess calories with serious comorbidity and body mass index (BMI) of 32.0 to 32.9 in adult   . Cognitive deficit, post-stroke   . Coronary artery disease involving native heart without angina pectoris   . Diabetic peripheral neuropathy ([place])   . Essential hypertension   . 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 Acute ST elevation myocardial infarction (STEMI) of inferior wall ([place]) 03/23/2019  . ST elevation myocardial infarction (STEMI) of inferior wall [facility])     Past Medical History:  Past Medical History:  Diagnosis Date  . Anxiety   . Coronary artery disease   . Diabetes ([place])   . Hyperlipidemia   . Myocardial infarction ([place])   . Tobacco abuse    Past Surgical History:  Past Surgical History:  Procedure Laterality Date  . CARDIAC CATHETERIZATION    . CORONARY ARTERY BYPASS GRAFT N/A 03/25/2019   Procedure: CORONARY ARTERY BYPASS GRAFTING (CABG) X THREE, ON PUMP, USING LEFT INTERNAL MAMMARY ARTERY AND LEFT GREATER SAPHENOUS VEIN HARVESTED ENDOSCOPICALLY;  Surgeon: Ivin Poot, MD;  Location: [facility];  Service: Open Heart Surgery;  Laterality: N/A;  . LEFT HEART CATH AND CORONARY ANGIOGRAPHY N/A 03/23/2019   Procedure: LEFT HEART CATH AND CORONARY ANGIOGRAPHY;  Surgeon: Burnell Blanks, MD;  Location: [facility] CV LAB;  Service: Cardiovascular;  Laterality: N/A;  . None    . TEE WITHOUT  CARDIOVERSION N/A 03/25/2019   Procedure: TRANSESOPHAGEAL ECHOCARDIOGRAM (TEE);  Surgeon: Prescott Gum, Collier Salina, MD;  Location: [facility];  Service: Open Heart Surgery;  Laterality: N/A;    Assessment & Plan Clinical Impression:  Joan Webb is a 48 year old right-handed female with history of diabetes mellitus with intermittent numbness in bilateral hands, tobacco abuse and hyperlipidemia as well as obesity with BMI 32.98.  History taken from chart review, ex-husband, and patient.  Patient lives alone.  Independent prior to admission 1 level home with 2 steps to entry.  She does have children in the area who plan to provide assistance on discharge as well as an ex-husband.  Presented 03/23/2019 with chest pain.  ECG performed showed inferior STEMI. Code STEMI was activated.  Noted sodium 131, glucose 338, troponin 24, WBC 14,300, SARS Covid negative.  Chest x-ray negative.  She was taken to the Cath Lab demonstrating severe multivessel coronary artery disease.  Underwent CABG x3 on 03/25/2019 per Dr. Darcey Nora.  Hospital course complicated by pain and acute blood loss anemia of 8.3.  Sternal precautions as indicated.  On 03/27/2019 patient was found to have right arm weakness and numbness.  Cranial CT scan showed small focus of hypodensity within the left centrum semiovale/corona radiata.  CTA of the neck bilateral common carotid, internal carotid and vertebral arteries patent within the neck without significant stenosis.  CTA of head with no large vessel occlusion.  MRI was not able to be performed due to postsurgical staples.  Carotid Dopplers 1 to 39% stenosis.  Echocardiogram showed ejection fraction of 65%.  Patient's diet was slowly advanced to a regular consistency.  Epicardial wires removed 03/30/2019 and follow-up chest x-ray completed showing progressive bilateral pulmonary infiltrates/edema.  Progressive bibasilar atelectasis.  Patient remained on Lasix therapy as directed.. Neurology recommended patient be on  ASA 81 and Plavix 75 x 3-week, then ASA alone.  Tolerating a regular consistency diet. Patient transferred to CIR on 03/30/2019 .   Patient currently requires min with mobility secondary to decreased cardiorespiratoy endurance, impaired timing and sequencing, abnormal tone and decreased motor planning, decreased safety awareness, decreased memory and delayed processing and decreased sitting balance, decreased standing balance, hemiplegia and decreased balance strategies.  Prior to hospitalization, patient was independent  with mobility and lived with Alone, Significant other in a House home.  Home access is 2Stairs to enter.  Patient will benefit from skilled PT intervention to maximize safe functional mobility, minimize fall risk and decrease caregiver burden for planned discharge home with 24 hour supervision.  Anticipate patient will benefit from follow up [place] at discharge.  PT - End of Session Activity Tolerance: Tolerates < 10 min activity, no significant change in vital signs Endurance Deficit: Yes Endurance Deficit Description: pt fatigued after gait x 25' PT Assessment Rehab Potential (ACUTE/IP ONLY): Good PT Patient demonstrates impairments in the following area(s): Balance;Behavior;Endurance;Motor;Pain;Safety;Sensory PT Transfers Functional Problem(s): Bed Mobility;Bed to Chair;Car;Furniture PT Locomotion Functional Problem(s): Ambulation;Stairs PT Plan PT Intensity: Minimum of 1-2 x/day ,45 to 90 minutes PT Frequency: 5 out of 7 days PT Duration Estimated Length of Stay: 7 PT Treatment/Interventions: Ambulation/gait training;Discharge planning;Functional mobility training;Psychosocial support;Therapeutic Activities;Balance/vestibular training;Neuromuscular re-education;Therapeutic Exercise;Wheelchair propulsion/positioning;Cognitive remediation/compensation;DME/adaptive equipment instruction;Pain management;Splinting/orthotics;UE/LE Strength taining/ROM;Community  reintegration;Patient/family education;Functional electrical stimulation;Stair training;UE/LE Coordination activities PT Transfers Anticipated Outcome(s): S for basic and car PT Locomotion Anticipated Outcome(s): S for gait x 150' wiht LRAD, CGA for up/down 12 steps 2 rails PT Recommendation Recommendations for Other Services: Neuropsych consult Follow Up Recommendations: Home health PT Patient destination: Home(of ex-husband) Equipment Details: may need RW  Skilled Therapeutic Intervention  Pt seated in recliner finishing up OT evaluation.  She stated that she was tired.  Chest and neck pain rated 8/10; she received meds during eval.  O2 sats 98% and HR 84 on room air.  Pt stated that she was depressed due to having the CVA in addition to the CABG.  PT provided emotional support. Pt was tangential throughout session. She began laughing uncontrollably later in session, setting off coughing spells, and had to be handed a pillow to hug when coughing.   Pt was able to describe sternal precautions somewhat, and functionally followed them, with min cues occasionally not to use UEs for sit> stand.  Pt requested using the toilet.  Gait without AD to BR with min assist due to R knee buckling occasionally.  Toilet transfer with min assist.  Pt continent of bladder; she performed peri care but needed mod assist for pants mgt. Pt used hand sanitizer on L hand afterwards, with set- up.  PT instructed pt in diaphragmatic breathing for pain relief and to address anxiety.  She voiced understanding but had poor carryover.    Gait training without AD in hallway, inlcuding turns, x 25' before fatiguing.  O2 sats after sitting 96%, HR 85.  Pt declined attempting stairs, stating that she was exhausted.  ELOS and POC discussed with pt.  Stand pivot transfer to return to bed.  Sit> supine with extra time, supervision.  At end of session, pt resting in bed  with alarm set and needs at hand.  PT  Evaluation Precautions/Restrictions Precautions Precautions: Sternal;Fall;Other (comment) Precaution Booklet Issued: No Precaution Comments: R-side hemiparesis Restrictions Other Position/Activity Restrictions: sternal precautions General   Vital SignsTherapy Vitals Pulse Rate: 85 Patient Position (if appropriate): Sitting Oxygen Therapy SpO2:  95 % O2 Device: Room Air Patient Activity (if Appropriate): In chair Pain Pain Assessment Pain Scale: 0-10 Pain Score: 8  Pain Type: Acute pain;Surgical pain Pain Location: Chest Pain Orientation: Mid;Upper Pain Descriptors / Indicators: Aching;Discomfort;Dull Pain Frequency: Constant Pain Onset: On-going Pain Intervention(s): Medication (See eMAR);Repositioned Home Living/Prior Functioning Home Living Living Arrangements: Alone Available Help at Discharge: Family;Available PRN/intermittently Type of Home: House Home Access: Stairs to enter Entrance Stairs-Number of Steps: 2 Entrance Stairs-Rails: Can reach both Pt reported need to navigate 10-15' on gravel from car to home entry. Home Layout: One level Bathroom Shower/Tub: Chiropodist: Standard Additional Comments: has a shower chair  Lives With: Alone;Significant other Prior Function Level of Independence: Independent with homemaking with ambulation;Independent with gait  Able to Take Stairs?: Yes Driving: Yes Vocation: Part time employment Leisure: (liked to paint and journal when she was younger) Vision/Perception  Vision - Assessment Eye Alignment: Within Functional Limits Ocular Range of Motion: Within Functional Limits Tracking/Visual Pursuits: Able to track stimulus in all quads without difficulty Perception Perception: Within Functional Limits Praxis Praxis: Impaired Praxis Impairment Details: Motor planning  Cognition Overall Cognitive Status: Within Functional Limits for tasks assessed Arousal/Alertness: Awake/alert Orientation Level:  Oriented X4 Attention: Sustained Sustained Attention: Impaired Memory: Impaired  Safety/Judgment: pt has continued to use trifocal glasses received in Oct, despite several falls which she attributes to them Sensation Sensation Light Touch: Impaired Detail Central sensation comments: R toe parasthesias worse since CVA Peripheral sensation comments: bil toes parasthesias due to DM Light Touch Impaired Details: Impaired RLE;Impaired LLE Proprioception: Impaired Detail Proprioception Impaired Details: Absent RLE;Impaired RUE Coordination Gross Motor Movements are Fluid and Coordinated: No Fine Motor Movements are Fluid and Coordinated: No Coordination and Movement Description: Right hand decr coordination (esoecially distally) Heel Shin Test: limited excursion, speed RLE Motor  Motor Motor: Hemiplegia;Abnormal tone Motor - Skilled Clinical Observations: pt reports nighttime spasticity RLE; hypotonic RUE  Mobility Bed Mobility Bed Mobility: Rolling Right;Rolling Left Rolling Right: Supervision/verbal cueing Rolling Left: Supervision/Verbal cueing Transfers Transfers: Sit to Stand;Stand to Sit;Stand Pivot Transfers Sit to Stand: Contact Guard/Touching assist Stand to Sit: Contact Guard/Touching assist Stand Pivot Transfers: Minimal Assistance - Patient > 75% Stand Pivot Transfer Details (indicate cue type and reason): cues to move slowly; PT guarding R knee due to buckling Transfer (Assistive device): None Locomotion  Gait Ambulation: Yes Gait Assistance: Minimal Assistance - Patient > 75% Gait Distance (Feet): 25 Feet Assistive device: None Gait Assistance Details: Verbal cues for precautions/safety Gait Assistance Details: assistance to guard for R knee buckling Gait Gait: Yes Gait Pattern: Impaired Gait Pattern: Decreased dorsiflexion - right;Decreased hip/knee flexion - right;Decreased weight shift to right;Decreased trunk rotation;Narrow base of support Gait velocity:  slow for age and gender Stairs / Additional Locomotion Stairs: No(pt declined; also pt fatigue) Product manager Mobility: No  Trunk/Postural Assessment  Cervical Assessment Cervical Assessment: Within Scientist, physiological Assessment: Exceptions to WFL(not tested due to recent CABG) Lumbar Assessment Lumbar Assessment: Within Functional Limits Postural Control Postural Control: Deficits on evaluation Protective Responses: limited by RUE lack of motor control  Balance Balance Balance Assessed: Yes Static Sitting Balance Static Sitting - Level of Assistance: 6: Modified independent (Device/Increase time)  Dynamic Sitting Balance Dynamic Sitting - Level of Assistance: 5: Stand by assistance Static Standing Balance Static Standing - Level of Assistance: 4: Min assist Dynamic Standing Balance Dynamic Standing - Level of Assistance: 4: Min assist Extremity Assessment      RLE Assessment General Strength Comments: grossly in sitting: 3-/5 hip flexion/adduction and abduciton, 4-/5 knee extension and ankle DF LLE Assessment LLE Assessment: Within Functional Limits    Refer to Care Plan for Long Term Goals  Recommendations for other services: Neuropsych Pt was emotionally labile during evaluation.  She also mentioned being depressed due to her present status. Discharge Criteria: Patient will be discharged from PT if patient refuses treatment 3 consecutive times without medical reason, if treatment goals not met, if there is a change in medical status, if patient makes no progress towards goals or if patient is discharged from hospital.  The above assessment, treatment plan, treatment alternatives and goals were discussed and mutually agreed upon: by patient  Shabria Egley 03/31/2019, 12:26 PM

## 2019-03-31 NOTE — Care Management (Signed)
Inpatient [facility] Individual Statement of Services  Patient Name:  Joan Webb  Date:  03/31/2019  Welcome to the [facility].  Our goal is to provide you with an individualized program based on your diagnosis and situation, designed to meet your specific needs.  With this comprehensive rehabilitation program, you will be expected to participate in at least 3 hours of rehabilitation therapies Monday-Friday, with modified therapy programming on the weekends.  Your rehabilitation program will include the following services:  Physical Therapy (PT), Occupational Therapy (OT), Speech Therapy (ST), 24 hour per day rehabilitation nursing, Neuropsychology, Case Management (Social Worker), Rehabilitation Medicine, Nutrition Services and Pharmacy Services  Weekly team conferences will be held on Wednesday to discuss your progress.  Your Social Worker will talk with you frequently to get your input and to update you on team discussions.  Team conferences with you and your family in attendance may also be held.  Expected length of stay: 7-10 days  Overall anticipated outcome: supervision-independent   Depending on your progress and recovery, your program may change. Your Social Worker will coordinate services and will keep you informed of any changes. Your Social Worker's name and contact numbers are listed  below.  The following services may also be recommended but are not provided by the [facility] will be made to provide these services after discharge if needed.  Arrangements include referral to agencies that provide these services.  Your insurance has been verified to be:  None-will apply for Medicaid Your primary doctor is:  None  Pertinent information will be shared with your doctor and your insurance  company.  Social Worker:  Ovidio Kin, [place] or ([phone]  Information discussed with and copy given to patient by: [name], 03/31/2019, 8:57 AM

## 2019-03-31 NOTE — Progress Notes (Signed)
Pt has returned from xray

## 2019-03-31 NOTE — Progress Notes (Signed)
Social Work Assessment and Plan   Patient Details  Name: Joan Webb MRN: [id number] Date of Birth: [birth year]  Today's Date: 03/31/2019  Problem List:  Patient Active Problem List   Diagnosis Date Noted  . Constipation   . Subcortical infarction ([facility]) 03/30/2019  . Acute cerebral infarction ([facility])   . Acute blood loss anemia   . Class 1 obesity due to excess calories with serious comorbidity and body mass index (BMI) of 32.0 to 32.9 in adult   . Cognitive deficit, post-stroke   . Coronary artery disease involving native heart without angina pectoris   . Diabetic peripheral neuropathy ([facility])   . Essential hypertension   . 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 Acute ST elevation myocardial infarction (STEMI) of inferior wall ([facility]) 03/23/2019  . ST elevation myocardial infarction (STEMI) of inferior wall [facility])    Past Medical History:  Past Medical History:  Diagnosis Date  . Anxiety   . Coronary artery disease   . Diabetes ([facility])   . Hyperlipidemia   . Myocardial infarction ([facility])   . Tobacco abuse    Past Surgical History:  Past Surgical History:  Procedure Laterality Date  . CARDIAC CATHETERIZATION    . CORONARY ARTERY BYPASS GRAFT N/A 03/25/2019   Procedure: CORONARY ARTERY BYPASS GRAFTING (CABG) X THREE, ON PUMP, USING LEFT INTERNAL MAMMARY ARTERY AND LEFT GREATER SAPHENOUS VEIN HARVESTED ENDOSCOPICALLY;  Surgeon: Kerin Perna, MD;  Location: [facility] OR;  Service: Open Heart Surgery;  Laterality: N/A;  . LEFT HEART CATH AND CORONARY ANGIOGRAPHY N/A 03/23/2019   Procedure: LEFT HEART CATH AND CORONARY ANGIOGRAPHY;  Surgeon: Kathleene Hazel, MD;  Location: [facility] LAB;  Service: Cardiovascular;  Laterality: N/A;  . None    . TEE WITHOUT CARDIOVERSION N/A 03/25/2019   Procedure: TRANSESOPHAGEAL ECHOCARDIOGRAM (TEE);  Surgeon: Donata Clay, Theron Arista, MD;  Location: [facility] OR;  Service: Open Heart Surgery;  Laterality: N/A;   Social History:  reports that she has been smoking  cigarettes. She has never used smokeless tobacco. She reports previous alcohol use. She reports previous drug use.  Family / Support Systems Marital Status: Divorced Patient Roles: Partner, Parent, Caregiver Spouse/Significant Other: Joel-ex-[phone] Children: two stepsons one is disabled and needs assist-both she and ex-husband assist him  Lana Fish [phone] Other Supports: Jerry-friend has her horses on his farm and assists with caring for animals. Anticipated Caregiver: Francis Dowse and adult son Ability/Limitations of Caregiver: Francis Dowse has health issues-hx MI's and cares for elderly gentleman. Will make sure pt has what she needs Caregiver Availability: 24/7 Family Dynamics: Close knit still after divorce Francis Dowse wants pt back and pt gets along well with him. Pt has numerous friends and people she can rely on and feels she is good in this area. 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 She wants Francis Dowse and herself included   Abuse/Neglect Abuse/Neglect Assessment Can Be Completed: Yes Physical Abuse: Denies Verbal Abuse: Denies Sexual Abuse: Denies Exploitation of patient/patient's resources: Denies Self-Neglect: Denies  Emotional Status Pt's affect,  behavior and adjustment status: Pt has always been independent and taken care of herself and others, she can't believe this has happened to her. She thought she was healthy. She does acknowledge her father has had MI's and  strokes and she got his genes. She becomes tearful when looking at her affected side form her stroke. Will have neuro-psych see while here Recent Psychosocial Issues: no issues- Psychiatric History: History of anxiety was dealing with it and not taking any medications. Do feel with her MI and CABG she would benefit from seeing neuro-psych while here. Acknowledged it is normal to grieve what has happened to her. Substance Abuse History: Tobacco aware needs to quit for her health and will try to. Spoke with cardiac rehab RN and knows the community resources available to her for this  Patient / Family Perceptions, Expectations & Goals Pt/Family understanding of illness & functional limitations: Pt and Fara Olden can explain pt's MI and CVA, bot have spoken with the MD and feel they have a good understanding of her treatment plan going forward. Pt knows she wil need to change some of her lifestyle habits. Premorbid pt/family roles/activities: Ex-wife, caregiver, friend, self employed, Higher education careers adviser lover, etc Anticipated changes in roles/activities/participation: resume Pt/family expectations/goals: Pt states: " I thought I was healthy and something like this would not happen to me."  Fara Olden states: " She will become independent again if anyone can she can."  [employer]: None Premorbid Home Care/DME Agencies: None Transportation available at discharge: Fara Olden, friends-pt drove prior to admission Resource referrals recommended: Neuropsychology  Discharge Planning Living Arrangements: Alone Support Systems: Spouse/significant other, Children, Other relatives, Friends/neighbors Type of Residence: Private residence Insurance Resources: Self-pay(Applying for [employer]) Financial Resources: Employment Museum/gallery curator Screen Referred: Yes Living Expenses: Rent Money Management: Patient Does the patient have any problems obtaining your medications?: Yes (Describe)(Uninsured and did not see a  MD) Home Management: Self Patient/Family Preliminary Plans: Plans to go to [place] with the boys due to someone is always there. Then once recovered can return to her place. She really wants to get back to her independent level. She is not one who depends upon others, they usually are relying on her. She doesn't like it either. Sw Barriers to Discharge: Medication compliance, Other (comments) Sw Barriers to Discharge Comments: Was not going to a MD and is uninsured Social Work Anticipated Follow Up Needs: [place] motivated female who has been through a lot in a brief period of time. She has a history of anxiety and feels it is worse now this has happened. Will make neuro-psych referral for her to be seen while here. Her ex-husband-Joel has been involved and very supportive she plans to go to stay with him the the boys when she leaves here. Will await team's evaluations and work on discharge needs-obtaining PCP and applying for SSD and Medicaid if appropriate.  [name] 03/31/2019, 10:20 AM

## 2019-03-31 NOTE — Progress Notes (Signed)
Chaplain visited to follow-up on an advanced directive.  The patient has completed the paperwork but is awaiting the notary and witnesses.  Chaplains will follow-up to complete the AD.  Brion Aliment Chaplain Resident For questions concerning this note please contact me by pager [phone]

## 2019-03-31 NOTE — Evaluation (Signed)
Occupational Therapy Assessment and Plan  Patient Details  Name: Joan Webb MRN: [id number] Date of Birth: 08/27/1970  OT Diagnosis: acute pain, hemiplegia affecting dominant side and muscle weakness (generalized) Rehab Potential: Rehab Potential (ACUTE ONLY): Good ELOS: 10-12 days   Today's Date: 03/31/2019 OT Individual Time: 1000-1045 OT Individual Time Calculation (min): 45 min     Problem List:  Patient Active Problem List   Diagnosis Date Noted  . Constipation   . Subcortical infarction ([place]) 03/30/2019  . Acute cerebral infarction ([place])   . Acute blood loss anemia   . Class 1 obesity due to excess calories with serious comorbidity and body mass index (BMI) of 32.0 to 32.9 in adult   . Cognitive deficit, post-stroke   . Coronary artery disease involving native heart without angina pectoris   . Diabetic peripheral neuropathy ([place])   . Essential hypertension   . 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 Acute ST elevation myocardial infarction (STEMI) of inferior wall ([place]) 03/23/2019  . ST elevation myocardial infarction (STEMI) of inferior wall [facility])     Past Medical History:  Past Medical History:  Diagnosis Date  . Anxiety   . Coronary artery disease   . Diabetes ([place])   . Hyperlipidemia   . Myocardial infarction ([place])   . Tobacco abuse    Past Surgical History:  Past Surgical History:  Procedure Laterality Date  . CARDIAC CATHETERIZATION    . CORONARY ARTERY BYPASS GRAFT N/A 03/25/2019   Procedure: CORONARY ARTERY BYPASS GRAFTING (CABG) X THREE, ON PUMP, USING LEFT INTERNAL MAMMARY ARTERY AND LEFT GREATER SAPHENOUS VEIN HARVESTED ENDOSCOPICALLY;  Surgeon: Ivin Poot, MD;  Location: [place];  Service: Open Heart Surgery;  Laterality: N/A;  . LEFT HEART CATH AND CORONARY ANGIOGRAPHY N/A 03/23/2019   Procedure: LEFT HEART CATH AND CORONARY ANGIOGRAPHY;  Surgeon: Burnell Blanks, MD;  Location: [place] CV LAB;  Service: Cardiovascular;  Laterality: N/A;  . None     . TEE WITHOUT CARDIOVERSION N/A 03/25/2019   Procedure: TRANSESOPHAGEAL ECHOCARDIOGRAM (TEE);  Surgeon: Prescott Gum, Collier Salina, MD;  Location: [place];  Service: Open Heart Surgery;  Laterality: N/A;    Assessment & Plan Clinical Impression: Patient is a 48 y.o. year old female with history of diabetes mellitus with intermittent numbness in bilateral hands, tobacco abuse and hyperlipidemia as well as obesity with BMI 32.98.  History taken from chart review, ex-husband, and patient.  Patient lives alone.  Independent prior to admission 1 level home with 2 steps to entry.  She does have children in the area who plan to provide assistance on discharge as well as an ex-husband.  Presented 03/23/2019 with chest pain.  ECG performed showed inferior STEMI. Code STEMI was activated.  Noted sodium 131, glucose 338, troponin 24, WBC 14,300, SARS Covid negative.  Chest x-ray negative.  She was taken to the Cath Lab demonstrating severe multivessel coronary artery disease.  Underwent CABG x3 on 03/25/2019 per Dr. Darcey Nora.  Hospital course complicated by pain and acute blood loss anemia of 8.3.  Sternal precautions as indicated.  On 03/27/2019 patient was found to have right arm weakness and numbness.  Cranial CT scan showed small focus of hypodensity within the left centrum semiovale/corona radiata.  CTA of the neck bilateral common carotid, internal carotid and vertebral arteries patent within the neck without significant stenosis.  CTA of head with no large vessel occlusion.  MRI was not able to be performed due to postsurgical staples.  Carotid Dopplers 1  to 39% stenosis.  Echocardiogram showed ejection fraction of 65%.  Patient's diet was slowly advanced to a regular consistency.  Epicardial wires removed 03/30/2019 and follow-up chest x-ray completed showing progressive bilateral pulmonary infiltrates/edema.  Progressive bibasilar atelectasis.  Patient remained on Lasix therapy as directed.. Neurology recommended patient be on  ASA 81 and Plavix 75 x 3-week, then ASA alone.Patient transferred to CIR on 03/30/2019 .    Patient currently requires min to mod A for ADL and min A for basic mobility with acute pain in chest at incision site secondary to muscle weakness and acute pain, decreased cardiorespiratoy endurance and decreased oxygen support, impaired timing and sequencing, unbalanced muscle activation, decreased coordination and decreased motor planning, decr endurance, decreased safety awareness and hemiplegia and decreased balance strategies.  Prior to hospitalization, patient could complete ADL with independent .  Patient will benefit from skilled intervention to decrease level of assist with basic self-care skills and increase independence with basic self-care skills prior to discharge home with care partner.  Anticipate patient will require intermittent supervision and follow up outpatient.  OT - End of Session Activity Tolerance: Tolerates 30+ min activity with multiple rests Endurance Deficit: Yes Endurance Deficit Description: pt fatigued after gait x 25' OT Assessment Rehab Potential (ACUTE ONLY): Good OT Patient demonstrates impairments in the following area(s): Balance;Cognition;Edema;Endurance;Motor;Pain;Safety;Sensory OT Basic ADL's Functional Problem(s): Grooming;Bathing;Dressing;Toileting OT Transfers Functional Problem(s): Toilet;Tub/Shower OT Additional Impairment(s): Fuctional Use of Upper Extremity OT Plan OT Intensity: Minimum of 1-2 x/day, 45 to 90 minutes OT Frequency: 5 out of 7 days OT Duration/Estimated Length of Stay: 10-12 days OT Treatment/Interventions: Balance/vestibular training;Discharge planning;Functional electrical stimulation;Pain management;Self Care/advanced ADL retraining;Therapeutic Activities;UE/LE Coordination activities;Cognitive remediation/compensation;Disease mangement/prevention;Functional mobility training;Patient/family education;Skin care/wound managment;Therapeutic  Exercise;Community reintegration;DME/adaptive equipment instruction;Neuromuscular re-education;Psychosocial support;UE/LE Strength taining/ROM OT Self Feeding Anticipated Outcome(s): n/a OT Basic Self-Care Anticipated Outcome(s): mod I OT Toileting Anticipated Outcome(s): mod I OT Bathroom Transfers Anticipated Outcome(s): mod I OT Recommendation Recommendations for Other Services: Neuropsych consult Patient destination: Home Follow Up Recommendations: Outpatient OT(cardiac rehab) Equipment Recommended: To be determined Equipment Details: has a shower chair   Skilled Therapeutic Intervention OT eval initiated with OT goals, purpose and role discussed. Pt was sitting EOB when arrived and reported had already washed up and didn't want to change clothes. Pt was wearing a hospital corset fastened in the front, brief and paper pants. Pt without personal items at this time. Ex husband to bring them up. Pt on one liter of O2 in the room. Pt reports feeling constipated and unable to void. Pt agreeable to leaving the room. Pt ambulated from room to ortho gym ~40 feet with two standing rest breaks with min A without AD. Pt able to tolerate room air throughout session above 90%. Pt reported no previous movement in right hand but with prompts and initial facilitation pt able to demonstrate some wrist flexion and finer flexion at the DIP/PIP.  Pt reports her hand is cold and desired to wear a sock over her hand (asked not for it to be cut like a glove). Pt able to ambulated to the apartment and perform furniture and bed transfers. Pt reports having a tall bed she has to climb in to but her family currently got her a new recliner. Pt required A to go from supine to sitting to maintain precautions and with pain. Pt ambulated back to her room with min A with one standing break. Pass off to next therapy.   OT Evaluation Precautions/Restrictions  Precautions Precautions: Sternal;Fall;Other (comment) Precaution  Booklet Issued: No Precaution Comments: R-side hemiparesis Restrictions Other Position/Activity Restrictions: sternal precautions General Chart Reviewed: Yes Family/Caregiver Present: Yes Vital Signs Therapy Vitals Pulse Rate: 85 Patient Position (if appropriate): Sitting Oxygen Therapy SpO2: (!) 5 % O2 Device: Room Air Patient Activity (if Appropriate): In chair Pain Pain Assessment Pain Scale: 0-10 Pain Score: 8  Pain Type: Acute pain;Surgical pain Pain Location: Chest Pain Orientation: Mid;Upper Pain Descriptors / Indicators: Aching;Discomfort;Dull Pain Frequency: Constant Pain Onset: On-going Pain Intervention(s): Medication (See eMAR);Repositioned Home Living/Prior [place] expects to be discharged to:: Private residence Living Arrangements: Alone Available Help at Discharge: Family, Available PRN/intermittently Type of Home: House Home Access: Stairs to enter Technical brewer of Steps: 2 Entrance Stairs-Rails: Can reach both Home Layout: One level Bathroom Shower/Tub: Chiropodist: Standard Additional Comments: has a shower chair  Lives With: Alone, Significant other Prior Function Level of Independence: Independent with homemaking with ambulation, Independent with gait  Able to Take Stairs?: Yes Driving: Yes Vocation: Part time employment Leisure: (liked to paint and journal when she was younger) ADL   Vision Baseline Vision/History: Wears glasses Wears Glasses: At all times Patient Visual Report: No change from baseline Vision Assessment?: Yes Eye Alignment: Within Functional Limits Ocular Range of Motion: Within Functional Limits Tracking/Visual Pursuits: Able to track stimulus in all quads without difficulty Perception  Perception: Within Functional Limits Praxis Praxis: Impaired Praxis Impairment Details: Motor planning Cognition Overall Cognitive Status: Within Functional Limits for tasks  assessed Arousal/Alertness: Awake/alert Orientation Level: Person;Place;Situation Person: Oriented Place: Oriented Situation: Oriented Year: 2020 Month: November Day of Week: Correct Memory: Impaired Immediate Memory Recall: Sock;Blue;Bed Memory Recall Sock: Without Cue Memory Recall Blue: Without Cue Memory Recall Bed: Without Cue Attention: Sustained Sustained Attention: Impaired Awareness: Appears intact Safety/Judgment: Appears intact Sensation Sensation Light Touch: Impaired Detail Central sensation comments: R toe parasthesias worse since CVA Peripheral sensation comments: bil toes parasthesias due to DM Light Touch Impaired Details: Impaired RLE;Impaired LLE Proprioception: Impaired Detail Proprioception Impaired Details: Absent RLE;Impaired RUE Coordination Gross Motor Movements are Fluid and Coordinated: No Fine Motor Movements are Fluid and Coordinated: No Coordination and Movement Description: Right hand decr coordination (esoecially distally) Heel Shin Test: limited excursion, speed RLE Motor  Motor Motor: Hemiplegia;Abnormal tone Motor - Skilled Clinical Observations: pt reports nighttime spasticity RLE; hypotonic RUE Mobility  Bed Mobility Bed Mobility: Rolling Right;Rolling Left Rolling Right: Supervision/verbal cueing Rolling Left: Supervision/Verbal cueing Transfers Sit to Stand: Contact Guard/Touching assist Stand to Sit: Contact Guard/Touching assist  Trunk/Postural Assessment  Cervical Assessment Cervical Assessment: Within Functional Limits Thoracic Assessment Thoracic Assessment: Within Functional Limits Lumbar Assessment Lumbar Assessment: Within Functional Limits Postural Control Postural Control: Deficits on evaluation Protective Responses: limited by RUE lack of motor control  Balance Balance Balance Assessed: Yes Static Sitting Balance Static Sitting - Level of Assistance: 6: Modified independent (Device/Increase time) Dynamic  Sitting Balance Dynamic Sitting - Level of Assistance: 5: Stand by assistance Static Standing Balance Static Standing - Level of Assistance: 4: Min assist Dynamic Standing Balance Dynamic Standing - Level of Assistance: 4: Min assist Extremity/Trunk Assessment RUE Assessment RUE Assessment: Exceptions to [facility] Active Range of Motion (AROM) Comments: 0-90 degrees General Strength Comments: 3/5 - distally only slight finger flexion and wrist extension- difficulty with wrist and finger extension; hand is very cold RUE Body System: Neuro Brunstrum levels for arm and hand: Arm;Hand Brunstrum level for arm: Stage IV Movement is deviating from synergy Brunstrum level for hand: Stage III Synergies performed voluntarily RUE  Tone RUE Tone: Within Functional Limits LUE Assessment LUE Assessment: Within Functional Limits     Refer to Care Plan for Long Term Goals  Recommendations for other services: Neuropsych   Discharge Criteria: Patient will be discharged from OT if patient refuses treatment 3 consecutive times without medical reason, if treatment goals not met, if there is a change in medical status, if patient makes no progress towards goals or if patient is discharged from hospital.  The above assessment, treatment plan, treatment alternatives and goals were discussed and mutually agreed upon: by patient  Nicoletta Ba 03/31/2019, 12:58 PM

## 2019-03-31 NOTE — Progress Notes (Addendum)
[place] PHYSICAL MEDICINE & REHABILITATION PROGRESS NOTE  Subjective/Complaints: Patient seen sitting up in bed this morning.  She states she did not sleep well overnight due to constipation.  She also notes musculoskeletal chest pain.  ROS: + Constipation, musculoskeletal chest pain. Denies SOB, N/V/D  Objective: Vital Signs: Blood pressure 129/67, pulse 82, temperature 98.6 F (37 C), temperature source Oral, resp. rate 18, height 5\' 2"  (1.575 m), weight 85.8 kg, SpO2 95 %. Dg Chest Port 1 View  Result Date: 03/30/2019 CLINICAL DATA:  Status post coronary bypass graft. EXAM: PORTABLE CHEST 1 VIEW COMPARISON:  March 29, 2019. FINDINGS: Stable cardiomegaly. Sternotomy wires are noted. No pneumothorax is noted. Stable bibasilar and perihilar opacities are noted concerning for edema or atelectasis. Small pleural effusions may be present. Bony thorax is unremarkable. IMPRESSION: Stable bilateral lung opacities as described above. Electronically Signed   By: Marijo Conception M.D.   On: 03/30/2019 08:08   Recent Labs    03/29/19 0338 03/30/19 0518  WBC 9.3 7.7  HGB 8.5* 8.3*  HCT 25.9* 25.2*  PLT 266 303   Recent Labs    03/29/19 0338 03/30/19 0518  NA 135 135  K 3.9 3.7  CL 98 96*  CO2 27 28  GLUCOSE 146* 137*  BUN 17 16  CREATININE 0.54 0.58  CALCIUM 8.5* 8.5*    Physical Exam: BP 129/67 (BP Location: Right Arm)   Pulse 82   Temp 98.6 F (37 C) (Oral)   Resp 18   Ht 5\' 2"  (1.575 m)   Wt 85.8 kg   SpO2 95%   BMI 34.60 kg/m  Constitutional: No distress . Vital signs reviewed. HENT: Normocephalic.  Atraumatic. Eyes: EOMI. No discharge. Cardiovascular: No JVD. Respiratory: Normal effort.  No stridor. GI: Mildly distended.  Bowel sounds hypoactive.. Skin: Midline chest incision C/D/I Psych: Tangential. Musc: No edema in extremities.  No tenderness in extremities. Neuro: Alert Neurological: She is alert.  Makes good eye contact.   Follows commands.   Motor:  Left upper extremity/left lower extremity: 5/5 proximal distal Right upper extremity: Shoulder abduction elbow flexion/extension 3/5, wrist extension, handgrip 0/5 Right lower extremity: 4/5 proximal to distal  Sensation intact light touch  Assessment/Plan: 1. Functional deficits secondary to left cerebral infarct which require 3+ hours per day of interdisciplinary therapy in a comprehensive inpatient rehab setting.  Physiatrist is providing close team supervision and 24 hour management of active medical problems listed below.  Physiatrist and rehab team continue to assess barriers to discharge/monitor patient progress toward functional and medical goals  Care Tool:  Bathing              Bathing assist       Upper Body Dressing/Undressing Upper body dressing   What is the patient wearing?: Bra, Pull over shirt    Upper body assist Assist Level: Minimal Assistance - Patient > 75%    Lower Body Dressing/Undressing Lower body dressing      What is the patient wearing?: Incontinence brief, Pants     Lower body assist Assist for lower body dressing: Minimal Assistance - Patient > 75%     Toileting Toileting    Toileting assist Assist for toileting: Minimal Assistance - Patient > 75%     Transfers Chair/bed transfer  Transfers assist     Chair/bed transfer assist level: Minimal Assistance - Patient > 75%     Locomotion Ambulation   Ambulation assist  Walk 10 feet activity   Assist           Walk 50 feet activity   Assist           Walk 150 feet activity   Assist           Walk 10 feet on uneven surface  activity   Assist           Wheelchair     Assist               Wheelchair 50 feet with 2 turns activity    Assist            Wheelchair 150 feet activity     Assist            Medical Problem List and Plan: 1.  Right side weakness with numbness secondary to left centrum  semiovale/corona radiata secondary to small vessel disease after cardiac bypass surgery 03/25/2019.  Sternal precautions  Begin CIR evaluations  WHO ordered for RUE 2.  Antithrombotics: -DVT/anticoagulation: SCDs.             -antiplatelet therapy: Aspirin 81 mg daily, Plavix 25 mg daily x3 weeks then aspirin alone 3. Pain Management: Neurontin 300 mg twice daily, oxycodone/Ultram as needed 4. Mood: Provide emotional support             -antipsychotic agents: N/A 5. Neuropsych: This patient is not fully capable of making decisions on her own behalf. 6. Skin/Wound Care: Routine skin checks 7. Fluids/Electrolytes/Nutrition: Routine in and outs.    CMP pending for the same. 8.  Acute blood loss anemia.    Hemoglobin 8.3 on 11/11, CBC pending for this AM. 9.  Hypertension.  Lopressor 25 mg twice daily, Lasix 40 mg  Daily  Monitor with increased mobility 10.  Diabetes mellitus with peripheral neuropathy.  Hemoglobin A1c 7.9.  Glucotrol 10 mg twice daily, Levemir 10 units twice daily.  Check blood sugars before meals and at bedtime  Monitor with increased mobility 11.  Tobacco abuse.  Continue NicoDerm patch as well as inhalers as directed.  Provide counseling 12.  Hyperlipidemia.  Lipitor 13.  Obesity.  BMI 32.98.  Dietary follow-up.  Encourage weight loss. 14.  Post stroke cognitive deficit             SLP for higher-level cognition 15.  Slow transit constipation  KUB ordered  Bowel meds increased on 11/12   LOS: 1 days A FACE TO FACE EVALUATION WAS PERFORMED   Karis Juba 03/31/2019, 8:10 AM

## 2019-03-31 NOTE — Progress Notes (Signed)
  Subjective: Abdominal pain with normal KUB, exam Will order sorbitol and stop po Fe tabs to help constipation NSR inclusions healing Starting to move R hand slightly Objective: Vital signs in last 24 hours: Temp:  [98 F (36.7 C)-98.6 F (37 C)] 98.6 F (37 C) (11/12 0402) Pulse Rate:  [76-89] 85 (11/12 1146) Cardiac Rhythm: Normal sinus rhythm (11/11 1255) Resp:  [15-24] 18 (11/12 0402) BP: (104-129)/(63-67) 129/67 (11/12 0402) SpO2:  [5 %-98 %] 5 % (11/12 1146) Weight:  [85.8 kg] 85.8 kg (11/11 1722)  Hemodynamic parameters for last 24 hours:    Intake/Output from previous day: 11/11 0701 - 11/12 0700 In: 240 [P.O.:240] Out: -  Intake/Output this shift: Total I/O In: 240 [P.O.:240] Out: -        Exam    General- alert and comfortable    Neck- no JVD, no cervical adenopathy palpable, no carotid bruit   Lungs- clear without rales, wheezes   Cor- regular rate and rhythm, no murmur , gallop   Abdomen- soft, non-tender   Extremities - warm, non-tender, minimal edema   Neuro- oriented, appropriate, no focal weakness   Lab Results: Recent Labs    03/30/19 0518 03/31/19 0811  WBC 7.7 8.5  HGB 8.3* 9.5*  HCT 25.2* 29.1*  PLT 303 343   BMET:  Recent Labs    03/30/19 0518 03/31/19 0811  NA 135 132*  K 3.7 4.1  CL 96* 92*  CO2 28 27  GLUCOSE 137* 258*  BUN 16 13  CREATININE 0.58 0.66  CALCIUM 8.5* 9.0    PT/INR: No results for input(s): LABPROT, INR in the last 72 hours. ABG    Component Value Date/Time   PHART 7.358 03/26/2019 0431   HCO3 21.5 03/26/2019 0431   TCO2 23 03/26/2019 0431   ACIDBASEDEF 3.0 (H) 03/26/2019 0431   O2SAT 56.8 03/30/2019 0950   CBG (last 3)  Recent Labs    03/30/19 2020 03/31/19 0643 03/31/19 1127  GLUCAP 173* 109* 153*    Assessment/Plan: S/P  Mobilize Diuresis Diabetes control  Appreciate rehab care by CIR   LOS: 1 day    Joan Webb 03/31/2019

## 2019-03-31 NOTE — Progress Notes (Signed)
Pt provided VIS sheets for vaccines. Pt declined both at this time and informed nurse that may get prior to discharge or follow up with primary care. Placed both vaccines back in refrigerator.

## 2019-03-31 NOTE — Progress Notes (Signed)
Orthopedic Tech Progress Note Patient Details:  Joan Webb Apr 19, 1971 [id number] Called in order to [facility] for a resting WHO Patient ID: Joan Webb, female   DOB: 01/09/1971, 48 y.o.   MRN: [id number]   [name] 03/31/2019, 8:46 AM

## 2019-03-31 NOTE — Evaluation (Addendum)
Speech Language Pathology Assessment and Plan  Patient Details  Name: Joan Webb MRN: [id number] Date of Birth: [birth year]  SLP Diagnosis: Cognitive Impairments  Rehab Potential: Good ELOS: 7-10 days    Today's Date: 03/31/2019 SLP Individual Time: 0102-7253 SLP Individual Time Calculation (min): 52 min   Problem List:  Patient Active Problem List   Diagnosis Date Noted  . Constipation   . Subcortical infarction ([place]) 03/30/2019  . Acute cerebral infarction ([place])   . Acute blood loss anemia   . Class 1 obesity due to excess calories with serious comorbidity and body mass index (BMI) of 32.0 to 32.9 in adult   . Cognitive deficit, post-stroke   . Coronary artery disease involving native heart without angina pectoris   . Diabetic peripheral neuropathy ([place])   . Essential hypertension   . 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 Acute ST elevation myocardial infarction (STEMI) of inferior wall ([place]) 03/23/2019  . ST elevation myocardial infarction (STEMI) of inferior wall [facility])    Past Medical History:  Past Medical History:  Diagnosis Date  . Anxiety   . Coronary artery disease   . Diabetes ([place])   . Hyperlipidemia   . Myocardial infarction ([place])   . Tobacco abuse    Past Surgical History:  Past Surgical History:  Procedure Laterality Date  . CARDIAC CATHETERIZATION    . CORONARY ARTERY BYPASS GRAFT N/A 03/25/2019   Procedure: CORONARY ARTERY BYPASS GRAFTING (CABG) X THREE, ON PUMP, USING LEFT INTERNAL MAMMARY ARTERY AND LEFT GREATER SAPHENOUS VEIN HARVESTED ENDOSCOPICALLY;  Surgeon: Ivin Poot, MD;  Location: [place];  Service: Open Heart Surgery;  Laterality: N/A;  . LEFT HEART CATH AND CORONARY ANGIOGRAPHY N/A 03/23/2019   Procedure: LEFT HEART CATH AND CORONARY ANGIOGRAPHY;  Surgeon: Burnell Blanks, MD;  Location: [place] CV LAB;  Service: Cardiovascular;  Laterality: N/A;  . None    . TEE WITHOUT CARDIOVERSION N/A 03/25/2019   Procedure: TRANSESOPHAGEAL  ECHOCARDIOGRAM (TEE);  Surgeon: Prescott Gum, Collier Salina, MD;  Location: [place];  Service: Open Heart Surgery;  Laterality: N/A;    Assessment / Plan / Recommendation Clinical Impression   HPI: [name] is a 48 year old right-handed female with history of diabetes mellitus with intermittent numbness in bilateral hands, tobacco abuse and hyperlipidemia as well as obesity with BMI 32.98.  History taken from chart review, ex-husband, and patient.  Patient lives alone.  Independent prior to admission 1 level home with 2 steps to entry.  She does have children in the area who plan to provide assistance on discharge as well as an ex-husband.  Presented 03/23/2019 with chest pain.  ECG performed showed inferior STEMI. Code STEMI was activated.  Noted sodium 131, glucose 338, troponin 24, WBC 14,300, SARS Covid negative.  Chest x-ray negative.  She was taken to the Cath Lab demonstrating severe multivessel coronary artery disease.  Underwent CABG x3 on 03/25/2019 per Dr. Darcey Nora.  Hospital course complicated by pain and acute blood loss anemia of 8.3.  Sternal precautions as indicated.  On 03/27/2019 patient was found to have right arm weakness and numbness.  Cranial CT scan showed small focus of hypodensity within the left centrum semiovale/corona radiata.  CTA of the neck bilateral common carotid, internal carotid and vertebral arteries patent within the neck without significant stenosis.  CTA of head with no large vessel occlusion.  MRI was not able to be performed due to postsurgical staples.  Carotid Dopplers 1 to 39% stenosis.  Echocardiogram showed ejection fraction  of 65%.  Patient's diet was slowly advanced to a regular consistency.  Epicardial wires removed 03/30/2019 and follow-up chest x-ray completed showing progressive bilateral pulmonary infiltrates/edema.  Progressive bibasilar atelectasis.  Patient remained on Lasix therapy as directed.. Neurology recommended patient be on ASA 81 and Plavix 75 x 3-week, then  ASA alone.  Tolerating a regular consistency diet.  Pt was admitted to CIR 03/30/19 and SLP cognitive-linguistic evaluation was completed 49/12/20 with results as follows:  Pt presents with cognitive impairments, some of which may be present at baseline but exacerbated in the setting of recent CVA. Although she scored WFL on formalized testing (Cognistat), pt presented with impairments impacting sustained attention, semi-complex to complex problem solving, recall of new information, and awareness cognitive deficits/their impact on functional daily living. When prompted, pt recalled 1 sternal precaution, however Total A verbal review required for recall of remaining aspects of sternal precautions; in functional situations she also demonstrated poor implementation of those precautions (raised hands above her head). Furthermore, although pt verbalized perceived physical deficits and was able to state she felt "slower" cognitively than at baseline, she did demonstrate awareness of how her cognitive functioning will impact daily living. Pt would benefit from skilled ST while inpatient to address above listed deficits in order to maximize functional independence upon discharge.   Skilled Therapeutic Interventions          Cognitive-linguistic evaluation was completed and results were reviewed with pt and her ex-hudband (see above for details).   SLP Assessment  Patient will need skilled Speech Lanaguage Pathology Services during CIR admission    Recommendations  SLP Diet Recommendations: Age appropriate regular solids;Thin Liquid Administration via: Cup;Straw Medication Administration: Whole meds with liquid Supervision: Patient able to self feed Oral Care Recommendations: Oral care BID Recommendations for Other Services: Neuropsych consult Patient destination: Home Follow up Recommendations: 24 hour supervision/assistance Equipment Recommended: None recommended by SLP    SLP Frequency 3 to 5 out of 7  days   SLP Duration  SLP Intensity  SLP Treatment/Interventions 7-10 days  Minumum of 1-2 x/day, 30 to 90 minutes  Cognitive remediation/compensation;Internal/external aids;Functional tasks;Cueing hierarchy;Patient/family education;Environmental controls    Pain Pain Assessment Pain Scale: 0-10 Pain Score: 0-No pain Pain Type: Acute pain;Surgical pain Pain Location: Chest Pain Orientation: Mid;Upper Pain Descriptors / Indicators: Aching;Discomfort;Dull Pain Frequency: Constant Pain Onset: On-going Pain Intervention(s): Medication (See eMAR);Repositioned  Prior Functioning Type of Home: House  Lives With: Alone;Significant other Available Help at Discharge: Family;Available PRN/intermittently  SLP Evaluation Cognition Overall Cognitive Status: Impaired/Different from baseline Arousal/Alertness: Awake/alert Orientation Level: Oriented X4 Attention: Sustained Sustained Attention: Impaired Sustained Attention Impairment: Verbal basic;Functional basic Memory: Impaired Memory Impairment: Storage deficit;Decreased recall of new information Immediate Memory Recall: Sock;Blue;Bed Memory Recall Sock: Without Cue Memory Recall Blue: Without Cue Memory Recall Bed: Without Cue Awareness: Impaired Awareness Impairment: Intellectual impairment(aware of physical deficits, however not cognitive) Problem Solving: Impaired Problem Solving Impairment: Verbal complex;Functional complex Executive Function: Organizing;Decision Conservation officer, historic buildings Impairment: Verbal complex;Functional complex Decision Making: Impaired Decision Making Impairment: Functional complex Safety/Judgment: Appears intact  Comprehension Auditory Comprehension Overall Auditory Comprehension: Appears within functional limits for tasks assessed Yes/No Questions: Not tested Commands: Within Functional Limits Conversation: Simple Visual Recognition/Discrimination Discrimination: Not tested Reading  Comprehension Reading Status: Not tested Expression Expression Primary Mode of Expression: Verbal Verbal Expression Overall Verbal Expression: Appears within functional limits for tasks assessed Initiation: No impairment Level of Generative/Spontaneous Verbalization: Conversation Repetition: No impairment Naming: No impairment Pragmatics: No impairment Non-Verbal Means of Communication: Not  applicable Written Expression Written Expression: Not tested Oral Motor Oral Motor/Sensory Function Overall Oral Motor/Sensory Function: Within functional limits Motor Speech Overall Motor Speech: Appears within functional limits for tasks assessed Respiration: Within functional limits Phonation: Normal Resonance: Within functional limits Articulation: Within functional limitis Intelligibility: Intelligible Motor Planning: Witnin functional limits Motor Speech Errors: Not applicable   Short Term Goals: Week 1: SLP Short Term Goal 1 (Week 1): STG=LTG due to ELOS  Refer to Care Plan for Long Term Goals  Recommendations for other services: Neuropsych  Discharge Criteria: Patient will be discharged from SLP if patient refuses treatment 3 consecutive times without medical reason, if treatment goals not met, if there is a change in medical status, if patient makes no progress towards goals or if patient is discharged from hospital.  The above assessment, treatment plan, treatment alternatives and goals were discussed and mutually agreed upon: by patient  [name] 03/31/2019, 3:08 PM

## 2019-04-01 ENCOUNTER — Inpatient Hospital Stay (HOSPITAL_COMMUNITY): Payer: Self-pay | Admitting: Speech Pathology

## 2019-04-01 ENCOUNTER — Inpatient Hospital Stay (HOSPITAL_COMMUNITY): Payer: Self-pay

## 2019-04-01 ENCOUNTER — Inpatient Hospital Stay (HOSPITAL_COMMUNITY): Payer: Self-pay | Admitting: Occupational Therapy

## 2019-04-01 DIAGNOSIS — E46 Unspecified protein-calorie malnutrition: Secondary | ICD-10-CM

## 2019-04-01 DIAGNOSIS — E871 Hypo-osmolality and hyponatremia: Secondary | ICD-10-CM

## 2019-04-01 DIAGNOSIS — R7309 Other abnormal glucose: Secondary | ICD-10-CM

## 2019-04-01 DIAGNOSIS — R252 Cramp and spasm: Secondary | ICD-10-CM

## 2019-04-01 DIAGNOSIS — E8809 Other disorders of plasma-protein metabolism, not elsewhere classified: Secondary | ICD-10-CM

## 2019-04-01 LAB — GLUCOSE, CAPILLARY
Glucose-Capillary: 211 mg/dL — ABNORMAL HIGH (ref 70–99)
Glucose-Capillary: 241 mg/dL — ABNORMAL HIGH (ref 70–99)
Glucose-Capillary: 185 mg/dL — ABNORMAL HIGH (ref 70–99)
Glucose-Capillary: 187 mg/dL — ABNORMAL HIGH (ref 70–99)

## 2019-04-01 MED ORDER — FLEET ENEMA 7-19 GM/118ML RE ENEM
1.0000 | ENEMA | Freq: Once | RECTAL | Status: AC
  Administered 2019-04-02: 1 via RECTAL
  Filled 2019-04-01 (×2): qty 1

## 2019-04-01 MED ORDER — MAGNESIUM HYDROXIDE 400 MG/5ML PO SUSP
30.0000 mL | Freq: Once | ORAL | Status: AC
  Administered 2019-04-01: 30 mL via ORAL
  Filled 2019-04-01: qty 30

## 2019-04-01 MED ORDER — PRO-STAT SUGAR FREE PO LIQD
30.0000 mL | Freq: Two times a day (BID) | ORAL | Status: DC
  Administered 2019-04-01 – 2019-04-09 (×17): 30 mL via ORAL
  Filled 2019-04-01 (×16): qty 30

## 2019-04-01 NOTE — Progress Notes (Signed)
Speech Language Pathology Daily Session Note  Patient Details  Name: Joan Webb MRN: [id number] Date of Birth: Jan 04, 1971  Today's Date: 04/01/2019 SLP Individual Time: 1001-1057 SLP Individual Time Calculation (min): 56 min  Short Term Goals: Week 1: SLP Short Term Goal 1 (Week 1): STG=LTG due to ELOS  Skilled Therapeutic Interventions: Pt was seen for skilled ST targeting cognitive goals. SLP facilitated session with a complex medication management task, during which pt organized a QID pill box with 11 current scheduled medications with 100% accuracy Mod I. Pt verbally recalled 2 familiar medication functions, however Max A verbal review required for recall of all other medication names, functions, dosages. Written aid also provided to assist with carryover/recall of medications. Pt demonstrated improved recall and implementation of sternal precautions today in comparison to yesterday, Min A verbal cues and a written handout provided. During session pt requested to use restroom; pt ambulated to toilet with RW with Min A verbal/tactile cues for sequencing, Mod A for general safety awareness and impulsivity. Pt left laying in bed with alarm set and all needs within reach, chaplain present to discuss advanced directive. Continue per current plan of care.       Pain Pain Assessment Pain Scale: 0-10 Pain Score: 7  Pain Type: Surgical pain Pain Location: Chest(at surgical site) Pain Orientation: Mid Pain Descriptors / Indicators: Sore Pain Onset: On-going Pain Intervention(s): Other (Comment);Distraction(pt reported she already had pain meds)  Therapy/Group: Individual Therapy  [name] 04/01/2019, 12:06 PM

## 2019-04-01 NOTE — Progress Notes (Signed)
Physical Therapy Session Note  Patient Details  Name: Joan Webb MRN: [id number] Date of Birth: 10-23-1970  Today's Date: 04/01/2019 PT Individual Time: 1300-1358 PT Individual Time Calculation (min): 58 min   Short Term Goals: Week 1:  PT Short Term Goal 1 (Week 1): = LTGs due to ELOS  Skilled Therapeutic Interventions/Progress Updates:   Received pt supine in bed, pt agreeable to PT, and denied any pain during sessin. Ex-husband present throughout session and pt requested he come along for therapy. Session focused on functional mobility/transfers, ambulation, balance/coordination, stair navigation, and improved tolerance to activity. Pt educated on sternal precautions and engaged in teach back listing precautions back to therapist. Pt performed bed mobility with supervision with HOB elevated x 2 trials throughout session. Pt ambulated 20ft to bathroom without AD min A for balance. Pt transferred stand<>sit and sit<>stand from commode CGA. Pt performed hygeine with supervision and LE clothing management min A while standing without UE support. Pt ambulated 69ft without AD min A to sink and washed hands. Pt transported to gym in [facility] for energy conservation and time management purposes. Pt navigated 4 steps with I rail on L min A for balance. O2 sat 92% and HR 89bpm on RA increasing to 97% with verbal cues for pursed lip breathing techniques. Pt ambulated 117ft with RW CGA with hand splint on R side on RW. Pt ambulated 37ft without AD on uneven surfaces (ramp) min A. O2 sat 95% and HR 86bpm on RA after activity. Pt performed 5x sit<>stand without UE support in 25.7 seconds with 1 LOB to the R requiring mod A to correct. Concluded session with pt supine in bed, needs within reach, and bed alarm on.  Therapy Documentation Precautions:  Precautions Precautions: Sternal, Fall, Other (comment) Precaution Booklet Issued: No Precaution Comments: R-side hemiparesis Restrictions Weight Bearing  Restrictions: No(sternal precautions) Other Position/Activity Restrictions: sternal precautions  Therapy/Group: Individual Therapy  Alfonse Alpers PT, DPT   04/01/2019, 7:46 AM

## 2019-04-01 NOTE — Progress Notes (Signed)
Chaplain, notary, and 2 witnesses visited with Bionca for completion of her AD paperwork. Chaplain left the completed AD paperwork on Shavanna's bedside table. Chaplain remains available for support as needs arise.   Chaplain Resident, Evelene Croon, M Div

## 2019-04-01 NOTE — Progress Notes (Signed)
Occupational Therapy Session Note  Patient Details  Name: Joan Webb MRN: [id number] Date of Birth: 02/02/1971  Today's Date: 04/01/2019 OT Individual Time: 4932-4199 OT Individual Time Calculation (min): 70 min    Short Term Goals: Week 1:  OT Short Term Goal 1 (Week 1): Pt will be able to step over tub ledge to shower chair with supervision. OT Short Term Goal 2 (Week 1): Pt will don shirt with set up OT Short Term Goal 3 (Week 1): Pt will navigate around room with supervision to obtain supplies for ADLs  Skilled Therapeutic Interventions/Progress Updates:    Pt greeted seated EOB finishing breakfast and agreeable to OT treatment session. Pt needed assistance to open milk container with one hand. Pt agreeable to wash hair, bathe, and dress today. Pt with poor safety awareness and verbose throughout session, which is likely baseline personality. Pt ambulated to the sink and OT facilitated weight bearing through R UE while pt stanidng to put head under faucet to wash hair. Pt tolerated standing for 5 minutes while shampooing and conditioning hair with 1 hand and CGA for balance. Pt fatigued after extended standing. Worked on R UE NMR within bathing tasks with hand over hand A. Pt with more voluntary flexor synergy with L UE today. Educated on hemi-dressing strategies with pt needing overall min A for bathing/dressing + verbal cues for sternal precautions. OT provided pt with level 1 yellow foam and educated on hand flex/ext. R UE NMR sitting EOB with focus on isolated finger flexion. Pt reported max fatigue and returned to supine with close supervision. Pt left semi-reclined in bed with bed alarm on, call bell in reach, and needs met.   Therapy Documentation Precautions:  Precautions Precautions: Sternal, Fall, Other (comment) Precaution Booklet Issued: No Precaution Comments: R-side hemiparesis Restrictions Weight Bearing Restrictions: No Other Position/Activity Restrictions: sternal  precautions Pain: Pain Assessment Pain Scale: 0-10 Pain Score: 7  Pain Type: Surgical pain Pain Location: Chest(at surgical site) Pain Orientation: Mid Pain Descriptors / Indicators: Sore Pain Onset: On-going Pain Intervention(s): Repositioned  Therapy/Group: Individual Therapy  Valma Cava 04/01/2019, 12:39 PM

## 2019-04-01 NOTE — Progress Notes (Signed)
[place] PHYSICAL MEDICINE & REHABILITATION PROGRESS NOTE  Subjective/Complaints: Patient seen sitting up in bed this AM, working with OT.  She states she had a tiring day of therapies yesterday and slept well overnight.  She notes she still has not had a bowel movement.  Discussed results of KUB with patient.  She was seen by CTS yesterday, no changes in Recs.  She states she wore her WHO overnight.  ROS: + Constipation, musculoskeletal chest pain, unchanged. Denies SOB, N/V/D  Objective: Vital Signs: Blood pressure (!) 115/59, pulse 87, temperature 97.9 F (36.6 C), temperature source Oral, resp. rate 16, height 5\' 2"  (1.575 m), weight 81.8 kg, SpO2 96 %. Dg Abd 1 View  Result Date: 03/31/2019 CLINICAL DATA:  No bowel movement for 6 days. EXAM: ABDOMEN - 1 VIEW COMPARISON:  Radiograph dated 03/25/2019 FINDINGS: The bowel gas pattern is normal. There is stool scattered throughout the colon but there is no evidence of fecal impaction. Bones are normal. No abnormal abdominal calcifications. IMPRESSION: Benign-appearing abdomen. Electronically Signed   By: 13/10/2018 M.D.   On: 03/31/2019 09:27   Recent Labs    03/30/19 0518 03/31/19 0811  WBC 7.7 8.5  HGB 8.3* 9.5*  HCT 25.2* 29.1*  PLT 303 343   Recent Labs    03/30/19 0518 03/31/19 0811  NA 135 132*  K 3.7 4.1  CL 96* 92*  CO2 28 27  GLUCOSE 137* 258*  BUN 16 13  CREATININE 0.58 0.66  CALCIUM 8.5* 9.0    Physical Exam: BP (!) 115/59 (BP Location: Right Arm)   Pulse 87   Temp 97.9 F (36.6 C) (Oral)   Resp 16   Ht 5\' 2"  (1.575 m)   Wt 81.8 kg   SpO2 96%   BMI 32.98 kg/m  Constitutional: No distress . Vital signs reviewed. HENT: Normocephalic.  Atraumatic. Eyes: EOMI. No discharge. Cardiovascular: No JVD. Respiratory: Normal effort.  No stridor. GI: Mildly distended. Skin: Midline chest incision C/D/I  Psych: Slightly tangential Musc: No edema in extremities.  No tenderness in extremities. Neuro:  Alert Makes good eye contact.   Follows commands.   Motor: Left upper extremity/left lower extremity: 5/5 proximal distal Right upper extremity: Shoulder abduction elbow flexion/extension 3/5, wrist extension 0/5, handgrip 1+ /5 Right lower extremity: 4+/5 proximal to distal  Some increased tone in right hand  Assessment/Plan: 1. Functional deficits secondary to left cerebral infarct which require 3+ hours per day of interdisciplinary therapy in a comprehensive inpatient rehab setting.  Physiatrist is providing close team supervision and 24 hour management of active medical problems listed below.  Physiatrist and rehab team continue to assess barriers to discharge/monitor patient progress toward functional and medical goals  Care Tool:  Bathing  Bathing activity did not occur: Refused           Bathing assist       Upper Body Dressing/Undressing Upper body dressing   What is the patient wearing?: Button up shirt Orthosis activity level: (Pt assisted)  Upper body assist Assist Level: Moderate Assistance - Patient 50 - 74%    Lower Body Dressing/Undressing Lower body dressing      What is the patient wearing?: Underwear/pull up, Pants     Lower body assist Assist for lower body dressing: Moderate Assistance - Patient 50 - 74%     Toileting Toileting Toileting Activity did not occur 13/12/20 and hygiene only): Refused  Toileting assist Assist for toileting: Supervision/Verbal cueing     Transfers  Chair/bed transfer  Transfers assist     Chair/bed transfer assist level: Contact Guard/Touching assist     Locomotion Ambulation   Ambulation assist      Assist level: Minimal Assistance - Patient > 75% Assistive device: No Device Max distance: 25   Walk 10 feet activity   Assist     Assist level: Minimal Assistance - Patient > 75% Assistive device: No Device   Walk 50 feet activity   Assist Walk 50 feet with 2 turns activity did not  occur: Safety/medical concerns(pt fatigue)         Walk 150 feet activity   Assist Walk 150 feet activity did not occur: Safety/medical concerns(pt fatigue)         Walk 10 feet on uneven surface  activity   Assist           Wheelchair     Assist Will patient use wheelchair at discharge?: No   Wheelchair activity did not occur: Safety/medical concerns(sternal precautions)         Wheelchair 50 feet with 2 turns activity    Assist            Wheelchair 150 feet activity     Assist            Medical Problem List and Plan: 1.  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  Sternal precautions  Continue CIR  WHO for RUE 2.  Antithrombotics: -DVT/anticoagulation: SCDs.             -antiplatelet therapy: Aspirin 81 mg daily, Plavix 25 mg daily x3 weeks then aspirin alone 3. Pain Management: Neurontin 300 mg twice daily, oxycodone/Ultram as needed 4. Mood: Provide emotional support             -antipsychotic agents: N/A 5. Neuropsych: This patient is not fully capable of making decisions on her own behalf. 6. Skin/Wound Care: Routine skin checks 7. Fluids/Electrolytes/Nutrition: Routine in and outs.   8.  Acute blood loss anemia.    Hemoglobin 9.5 on 11/12  Continue to monitor 9.  Hypertension.  Lopressor 25 mg twice daily, Lasix 40 mg  Daily  Relatively controlled on 11/13  Monitor with increased mobility 10.  Diabetes mellitus with peripheral neuropathy.  Hemoglobin A1c 7.9.  Glucotrol 10 mg twice daily, Levemir 10 units twice daily.  Check blood sugars before meals and at bedtime  Labile on 11/13  Monitor with increased mobility 11.  Tobacco abuse.  Continue NicoDerm patch as well as inhalers as directed.  Provide counseling 12.  Hyperlipidemia.  Lipitor 13.  Obesity.  BMI 32.98.  Dietary follow-up.   Encourage weight loss. 14.  Post stroke cognitive deficit             SLP for higher-level cognition 15.  Slow transit constipation  KUB personally reviewed, unremarkable  Bowel meds increased on 11/12  Additional meds given x1 on 11/13 16.  Hyponatremia  Sodium 132 on 11/12  Continue to monitor 17.  Hypoalbuminemia  Supplement initiated on 11/13   LOS: 2 days A FACE TO FACE EVALUATION WAS PERFORMED  Joan Webb Lorie Phenix 04/01/2019, 8:04 AM

## 2019-04-02 ENCOUNTER — Inpatient Hospital Stay (HOSPITAL_COMMUNITY): Payer: Self-pay | Admitting: Occupational Therapy

## 2019-04-02 ENCOUNTER — Inpatient Hospital Stay (HOSPITAL_COMMUNITY): Payer: Self-pay | Admitting: Speech Pathology

## 2019-04-02 ENCOUNTER — Inpatient Hospital Stay (HOSPITAL_COMMUNITY): Payer: Self-pay | Admitting: Physical Therapy

## 2019-04-02 DIAGNOSIS — D62 Acute posthemorrhagic anemia: Secondary | ICD-10-CM

## 2019-04-02 LAB — GLUCOSE, CAPILLARY
Glucose-Capillary: 213 mg/dL — ABNORMAL HIGH (ref 70–99)
Glucose-Capillary: 154 mg/dL — ABNORMAL HIGH (ref 70–99)
Glucose-Capillary: 185 mg/dL — ABNORMAL HIGH (ref 70–99)
Glucose-Capillary: 221 mg/dL — ABNORMAL HIGH (ref 70–99)

## 2019-04-02 NOTE — Progress Notes (Signed)
Physical Therapy Session Note  Patient Details  Name: Joan Webb MRN: [id number] Date of Birth: 1970-11-08  Today's Date: 04/02/2019 PT Individual Time: 1502-1600 PT Individual Time Calculation (min): 58 min   Short Term Goals: Week 1:  PT Short Term Goal 1 (Week 1): = LTGs due to ELOS  Skilled Therapeutic Interventions/Progress Updates:   Pt received supine in bed and agreeable to bed level PT. With great effort PT able to convince pt to participate in OOB therapy. Supine>sit with supevrision assist with mi ncues for decreased use of BUE and sternal precautions. Stand pivot transfer to [facility] with supervision assist and no AD. Pt transported to entrance of [place] and instructed in gait training with RW x 84f and without AD x 1028f CGA provided throughout all gait training as well and min cues for improved step width to improve stability. Throughout gait training and treatment session, pt extremely internally and externally distracted, requiring multiple redirections to attention to task to improve safety and maximize therapeutic benefits of Pt treatment. Patient returned to room and left sitting EOB with call bell in reach and all needs met.        Therapy Documentation Precautions:  Precautions Precautions: Sternal, Fall, Other (comment) Precaution Booklet Issued: No Precaution Comments: R-side hemiparesis Restrictions Weight Bearing Restrictions: No Other Position/Activity Restrictions: sternal precautions Vital Signs: Therapy Vitals Temp: 97.9 F (36.6 C) Temp Source: Oral Pulse Rate: 79 Resp: 17 BP: 112/73 Patient Position (if appropriate): Sitting Oxygen Therapy SpO2: 95 % O2 Device: Room Air Pain: Pain Assessment Pain Scale: 0-10 Pain Score: 6  Pain Type: Surgical pain Pain Location: Chest Pain Orientation: Mid Pain Descriptors / Indicators: Sore Pain Frequency: Constant Pain Onset: On-going Patients Stated Pain Goal: 5 Pain Intervention(s): Medication (See  eMAR)   Therapy/Group: Individual Therapy  AuLorie Phenix1/14/2020, 4:32 PM

## 2019-04-02 NOTE — Progress Notes (Signed)
[place] PHYSICAL MEDICINE & REHABILITATION PROGRESS NOTE  Subjective/Complaints: Patient seen standing up at the sink working with therapy this morning.  She states she did not sleep well overnight due to musculoskeletal chest pain which improved with medications, however she still was not able to go back to sleep.  She states she has this issue at home as well.  She requests to go to the cafeteria with her ex-husband.  She notes she still has not had a bowel movement, but did not take her ordered bowel meds yesterday, discussed.  ROS: + Constipation, unchanged. Denies SOB, N/V/D  Objective: Vital Signs: Blood pressure 107/73, pulse 72, temperature 98.1 F (36.7 C), resp. rate 16, height 5\' 2"  (1.575 m), weight 80.5 kg, SpO2 95 %. Dg Abd 1 View  Result Date: 03/31/2019 CLINICAL DATA:  No bowel movement for 6 days. EXAM: ABDOMEN - 1 VIEW COMPARISON:  Radiograph dated 03/25/2019 FINDINGS: The bowel gas pattern is normal. There is stool scattered throughout the colon but there is no evidence of fecal impaction. Bones are normal. No abnormal abdominal calcifications. IMPRESSION: Benign-appearing abdomen. Electronically Signed   By: 13/10/2018 M.D.   On: 03/31/2019 09:27   Recent Labs    03/31/19 0811  WBC 8.5  HGB 9.5*  HCT 29.1*  PLT 343   Recent Labs    03/31/19 0811  NA 132*  K 4.1  CL 92*  CO2 27  GLUCOSE 258*  BUN 13  CREATININE 0.66  CALCIUM 9.0    Physical Exam: BP 107/73 (BP Location: Right Arm)   Pulse 72   Temp 98.1 F (36.7 C)   Resp 16   Ht 5\' 2"  (1.575 m)   Wt 80.5 kg   SpO2 95%   BMI 32.46 kg/m  Constitutional: No distress . Vital signs reviewed. HENT: Normocephalic.  Atraumatic. Eyes: EOMI. No discharge. Cardiovascular: No JVD. Respiratory: Normal effort.  No stridor. GI: Mildly distended. Skin: Midline chest incision C/D/I Psych: Easily agitated. Musc: No edema in extremities.  No tenderness in extremities. Neuro: Alert Makes good eye  contact.   Follows commands.   Motor: Left upper extremity/left lower extremity: 5/5 proximal distal Right upper extremity: Shoulder abduction elbow flexion/extension 3/5, wrist extension 0/5, handgrip 1+ /5, unchanged Right lower extremity: 4+/5 proximal to distal, unchanged  Some increased tone in right hand  Assessment/Plan: 1. Functional deficits secondary to left cerebral infarct which require 3+ hours per day of interdisciplinary therapy in a comprehensive inpatient rehab setting.  Physiatrist is providing close team supervision and 24 hour management of active medical problems listed below.  Physiatrist and rehab team continue to assess barriers to discharge/monitor patient progress toward functional and medical goals  Care Tool:  Bathing  Bathing activity did not occur: Refused Body parts bathed by patient: Right arm, Abdomen, Chest, Front perineal area, Buttocks, Right upper leg, Left upper leg, Right lower leg, Left lower leg, Face   Body parts bathed by helper: Left arm     Bathing assist Assist Level: Minimal Assistance - Patient > 75%     Upper Body Dressing/Undressing Upper body dressing   What is the patient wearing?: Button up shirt Orthosis activity level: (Pt assisted)  Upper body assist Assist Level: Moderate Assistance - Patient 50 - 74%    Lower Body Dressing/Undressing Lower body dressing      What is the patient wearing?: Pants     Lower body assist Assist for lower body dressing: Minimal Assistance - Patient > 75%  Toileting Toileting Toileting Activity did not occur Landscape architect and hygiene only): Refused  Toileting assist Assist for toileting: Supervision/Verbal cueing     Transfers Chair/bed transfer  Transfers assist     Chair/bed transfer assist level: Contact Guard/Touching assist     Locomotion Ambulation   Ambulation assist      Assist level: Contact Guard/Touching assist Assistive device: Walker-rolling Max  distance: 140ft   Walk 10 feet activity   Assist     Assist level: Contact Guard/Touching assist Assistive device: Walker-rolling   Walk 50 feet activity   Assist Walk 50 feet with 2 turns activity did not occur: Safety/medical concerns(pt fatigue)  Assist level: Contact Guard/Touching assist Assistive device: Walker-rolling    Walk 150 feet activity   Assist Walk 150 feet activity did not occur: Safety/medical concerns(pt fatigue)  Assist level: Contact Guard/Touching assist Assistive device: Walker-rolling    Walk 10 feet on uneven surface  activity   Assist     Assist level: Minimal Assistance - Patient > 75%     Wheelchair     Assist Will patient use wheelchair at discharge?: No   Wheelchair activity did not occur: Safety/medical concerns(sternal precautions)         Wheelchair 50 feet with 2 turns activity    Assist            Wheelchair 150 feet activity     Assist            Medical Problem List and Plan: 1.  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  Sternal precautions  Continue CIR  WHO for RUE  Grounds as ordered x1, instructed on going to the cafeteria to order food and returning with ex-husband. 2.  Antithrombotics: -DVT/anticoagulation: SCDs.             -antiplatelet therapy: Aspirin 81 mg daily, Plavix 25 mg daily x3 weeks then aspirin alone 3. Pain Management: Neurontin 300 mg twice daily, oxycodone/Ultram as needed 4. Mood: Provide emotional support             -antipsychotic agents: N/A 5. Neuropsych: This patient is not fully capable of making decisions on her own behalf. 6. Skin/Wound Care: Routine skin checks 7. Fluids/Electrolytes/Nutrition: Routine in and outs.   8.  Acute blood loss anemia.    Hemoglobin 9.5 on 11/12  Continue to monitor 9.  Hypertension.  Lopressor 25  mg twice daily, Lasix 40 mg  Daily  Relatively controlled on 11/14  Monitor with increased mobility 10.  Diabetes mellitus with peripheral neuropathy.  Hemoglobin A1c 7.9.  Glucotrol 10 mg twice daily, Levemir 10 units twice daily.  Check blood sugars before meals and at bedtime  Labile on 11/14  Monitor with increased mobility 11.  Tobacco abuse.  Continue NicoDerm patch as well as inhalers as directed.  Provide counseling 12.  Hyperlipidemia.  Lipitor 13.  Obesity.  BMI 32.98.  Dietary follow-up.  Encourage weight loss. 14.  Post stroke cognitive deficit             SLP for higher-level cognition 15.  Slow transit constipation  KUB personally reviewed, unremarkable  Bowel meds increased on 11/12  Additional meds given x1 on 11/13, however patient did not take.  Discussed compliance with bowel meds 16.  Hyponatremia  Sodium 132 on 11/12, labs ordered for Monday  Continue to monitor 17.  Hypoalbuminemia  Supplement initiated on 11/13  LOS: 3 days A FACE TO FACE EVALUATION WAS PERFORMED  Ankit Karis Juba 04/02/2019, 8:51 AM

## 2019-04-02 NOTE — Progress Notes (Signed)
Speech Language Pathology Daily Session Note  Patient Details  Name: Joan Webb MRN: [id number] Date of Birth: [birth year]  Today's Date: 04/02/2019 SLP Individual Time: 3704-8889 SLP Individual Time Calculation (min): 40 min  Short Term Goals: Week 1: SLP Short Term Goal 1 (Week 1): STG=LTG due to ELOS  Skilled Therapeutic Interventions: Skilled treatment session focused on cognitive goals. SLP facilitated session by providing supervision level verbal cues for complex problem solving during a money management task. However, she was Mod I for organization and sustained attention to task. Patient requested to use the bathroom and ambulated with the RW with overall supervision level verbal cues for safety with task. Patient left upright in bed with alarm on and all needs within reach. Continue with current plan of care.       Pain Pain Assessment Pain Scale: 0-10 Pain Score: 8  Pain Type: Surgical pain Pain Location: Chest Pain Orientation: Mid Pain Descriptors / Indicators: Sore Pain Frequency: Constant Pain Onset: On-going Patients Stated Pain Goal: 5 Pain Intervention(s): Medication (See eMAR)  Therapy/Group: Individual Therapy  Joan Webb 04/02/2019, 2:10 PM

## 2019-04-02 NOTE — Progress Notes (Signed)
Occupational Therapy Session Note  Patient Details  Name: Joan Webb MRN: [id number] Date of Birth: 01-09-1971  Today's Date: 04/02/2019 OT Individual Time: 2694-  850=75 minutes      Short Term Goals: Week 1:  OT Short Term Goal 1 (Week 1): Pt will be able to step over tub ledge to shower chair with supervision. OT Short Term Goal 2 (Week 1): Pt will don shirt with set up OT Short Term Goal 3 (Week 1): Pt will navigate around room with supervision to obtain supplies for ADLs  Skilled Therapeutic Interventions/Progress Updates: Patient complaining of extreme fatigue and groggines.   Intermittently closed her eyes, sat and laid her head against bed or wherever she could rest it, and stated she was fatigued and "I barely slept last night.   My chest was hurting or felt weird."     Otherwise, she participated within her endurance tolerance.  She was already dressed and elected not to bathe or re dress this morning.Focus was as follows:  L hemi hand use as stabilizer and hand over over hand assist with max A Functional mobility with HHA to close S for navigating around room and prep for self care supplies and to complete home skills (she straightened her room and cleaned sink, rearranged items, etc) = close S.  dynamic standing balance = close S due to patient stated she was fatigued and sleepy  Oral care standing at sink and using right hand with max A. (hand over hand asssist)  Right UE neuro reducation and education on bilateral activities.   Patient was able to complete ROM through right UE (not flexing or abducting past 90 degrees to stand within cardio precautions); however, during functional activities she required constant tactile, verbal or physical cues to incorporating...slight R UE inattention   She completed sit to stand without assistive device from low surfaces (bed, toilet) staying withing sternal precautions and without an assistive device with close S.  Toilet transfer=  close S (via regular in room toilet) Toilet transfer= close S (HHA to alone)   Energy conservation - patient automatically rested as she felt she needed as she fatigued  At end of session, patient was able to cmplete edge of bed transfer to lying wth HOB supine with distant S.      Bed alarm engaged and phone within reach.  Continue OT Plan of care      Therapy Documentation Precautions:  Precautions Precautions: Sternal, Fall, Other (comment) Precaution Booklet Issued: No Precaution Comments: R-side hemiparesis Restrictions Weight Bearing Restrictions: No Other Position/Activity Restrictions: sternal precautions  Pain:denied   Therapy/Group: Individual Therapy  Alfredia Ferguson [facility] 04/02/2019, 8:16 AM

## 2019-04-03 ENCOUNTER — Inpatient Hospital Stay (HOSPITAL_COMMUNITY): Payer: Self-pay | Admitting: Speech Pathology

## 2019-04-03 ENCOUNTER — Inpatient Hospital Stay (HOSPITAL_COMMUNITY): Payer: Self-pay | Admitting: Occupational Therapy

## 2019-04-03 LAB — GLUCOSE, CAPILLARY
Glucose-Capillary: 151 mg/dL — ABNORMAL HIGH (ref 70–99)
Glucose-Capillary: 144 mg/dL — ABNORMAL HIGH (ref 70–99)
Glucose-Capillary: 164 mg/dL — ABNORMAL HIGH (ref 70–99)
Glucose-Capillary: 223 mg/dL — ABNORMAL HIGH (ref 70–99)

## 2019-04-03 MED ORDER — NON FORMULARY: 1.5000 mg | Freq: Every day | Status: DC

## 2019-04-03 MED ORDER — MELATONIN 3 MG PO TABS
1.5000 mg | ORAL_TABLET | Freq: Every day | ORAL | Status: DC
  Administered 2019-04-03 – 2019-04-08 (×6): 1.5 mg via ORAL
  Filled 2019-04-03 (×6): qty 0.5

## 2019-04-03 NOTE — Progress Notes (Signed)
Speech Language Pathology Daily Session Note  Patient Details  Name: Joan Webb MRN: [id number] Date of Birth: [birth year]  Today's Date: 04/03/2019 SLP Individual Time: 0175-1025 SLP Individual Time Calculation (min): 55 min  Short Term Goals: Week 1: SLP Short Term Goal 1 (Week 1): STG=LTG due to ELOS  Skilled Therapeutic Interventions:  Pt was seen for skilled ST targeting cognitive goals.  Pt was resting in bed upon arrival, stating that she was exhausted and was getting ready to rest but was agreeable to participating in therapy.  Pt independently recalled information regarding the time of today's therapy session without having to refer to her schedule.  Shortly after therapist's arrival, pt received a phone call from a friend who is helping take care of pt's horses while she's at the hospital and pt was able to independently direct her horses' care over the phone (telling friend where to find feed, hay, and how to fill water buckets).  Once phone call was completed, pt verbalized frustration over her perceived loss of independence post CVA and CABG.  SLP discussed realistic prognostic expectations and sequela of stroke recovery with pt and initiated conversations regarding discharge planning with pt who reports that her ex-husband and sons will be able to provide at least intermittent assistance at home, although she does endorse that she and her ex-husband have a rather tumultuous relationship.   Pt was able to recall completing medication and financial management tasks in previous therapy sessions and SLP reinforced the importance of using a pill box and having at least initial assistance for these types of tasks once home.  All questions were answered to pt's satisfaction at this time.  Pt was left in bed with bed alarm set and call bell within reach.  Continue per current plan of care.    Pain Pain Assessment Pain Scale: 0-10 Pain Score: 0-No pain  Therapy/Group: Individual  Therapy  Joan Webb, Selinda Orion 04/03/2019, 12:37 PM

## 2019-04-03 NOTE — Progress Notes (Addendum)
[place] PHYSICAL MEDICINE & REHABILITATION PROGRESS NOTE  Subjective/Complaints: Patient seen sitting up in bed this AM.  She states she slept fairly overnight, but requests a sleeping pill to sleep better.  She enjoyed her date with her ex-husband and going outside with therapies. She notes ambulation without RW.  She notes she had a BM. She has questions about discharge date.   ROS: Denies SOB, N/V/D  Objective: Vital Signs: Blood pressure 95/61, pulse 76, temperature 98.1 F (36.7 C), resp. rate 18, height 5\' 2"  (1.575 m), weight 80.1 kg, SpO2 96 %. No results found. No results for input(s): WBC, HGB, HCT, PLT in the last 72 hours. No results for input(s): NA, K, CL, CO2, GLUCOSE, BUN, CREATININE, CALCIUM in the last 72 hours.  Physical Exam: BP 95/61 (BP Location: Right Arm)   Pulse 76   Temp 98.1 F (36.7 C)   Resp 18   Ht 5\' 2"  (1.575 m)   Wt 80.1 kg   SpO2 96%   BMI 32.30 kg/m  Constitutional: No distress . Vital signs reviewed. HENT: Normocephalic.  Atraumatic. Eyes: EOMI. No discharge. Cardiovascular: No JVD. Respiratory: Normal effort.  No stridor. GI: Non-distended. Skin: Midline chest incision C/D/I Psych: Normal mood.  Normal behavior. Musc: No edema in extremities.  No tenderness in extremities. Neuro: Alert  Makes good eye contact.   Follows commands.   Motor: Left upper extremity/left lower extremity: 5/5 proximal distal Right upper extremity: Shoulder abduction elbow flexion/extension 3/5, wrist extension 0/5, handgrip 1+ /5, stable Right lower extremity: 4+/5 proximal to distal, stable Some increased tone in right hand  Assessment/Plan: 1. Functional deficits secondary to left cerebral infarct which require 3+ hours per day of interdisciplinary therapy in a comprehensive inpatient rehab setting.  Physiatrist is providing close team supervision and 24 hour management of active medical problems listed below.  Physiatrist and rehab team continue to  assess barriers to discharge/monitor patient progress toward functional and medical goals  Care Tool:  Bathing  Bathing activity did not occur: Refused Body parts bathed by patient: Right arm, Abdomen, Chest, Front perineal area, Buttocks, Right upper leg, Left upper leg, Right lower leg, Left lower leg, Face   Body parts bathed by helper: Left arm     Bathing assist Assist Level: Minimal Assistance - Patient > 75%     Upper Body Dressing/Undressing Upper body dressing Upper body dressing/undressing activity did not occur (including orthotics): (she'd already dressed for the day) What is the patient wearing?: Button up shirt Orthosis activity level: (Pt assisted)  Upper body assist Assist Level: Moderate Assistance - Patient 50 - 74%    Lower Body Dressing/Undressing Lower body dressing      What is the patient wearing?: Pants     Lower body assist Assist for lower body dressing: Minimal Assistance - Patient > 75%     Toileting Toileting Toileting Activity did not occur and hygiene only): Refused  Toileting assist Assist for toileting: Supervision/Verbal cueing     Transfers Chair/bed transfer  Transfers assist     Chair/bed transfer assist level: Contact Guard/Touching assist     Locomotion Ambulation   Ambulation assist      Assist level: Contact Guard/Touching assist Assistive device: Walker-rolling Max distance: 134ft   Walk 10 feet activity   Assist     Assist level: Contact Guard/Touching assist Assistive device: Walker-rolling   Walk 50 feet activity   Assist Walk 50 feet with 2 turns activity did not occur: Safety/medical concerns(pt  fatigue)  Assist level: Contact Guard/Touching assist Assistive device: Walker-rolling    Walk 150 feet activity   Assist Walk 150 feet activity did not occur: Safety/medical concerns(pt fatigue)  Assist level: Contact Guard/Touching assist Assistive device: Walker-rolling     Walk 10 feet on uneven surface  activity   Assist     Assist level: Minimal Assistance - Patient > 75%     Wheelchair     Assist Will patient use wheelchair at discharge?: No   Wheelchair activity did not occur: Safety/medical concerns(sternal precautions)         Wheelchair 50 feet with 2 turns activity    Assist            Wheelchair 150 feet activity     Assist            Medical Problem List and Plan: 1.  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  Sternal precautions  Continue CIR  WHO for RUE 2.  Antithrombotics: -DVT/anticoagulation: SCDs.             -antiplatelet therapy: Aspirin 81 mg daily, Plavix 25 mg daily x3 weeks then aspirin alone 3. Pain Management: Neurontin 300 mg twice daily, oxycodone/Ultram as needed 4. Mood: Provide emotional support             -antipsychotic agents: N/A 5. Neuropsych: This patient is not fully capable of making decisions on her own behalf. 6. Skin/Wound Care: Routine skin checks 7. Fluids/Electrolytes/Nutrition: Routine in and outs.   8.  Acute blood loss anemia.    Hemoglobin 9.5 on 11/12  Continue to monitor 9.  Hypertension.  Lopressor 25 mg twice daily, Lasix 40 mg  Daily  Relatively controlled on 11/15  Monitor with increased mobility 10.  Diabetes mellitus with peripheral neuropathy.  Hemoglobin A1c 7.9.  Glucotrol 10 mg twice daily, Levemir 10 units twice daily.  Check blood sugars before meals and at bedtime  Labile on 11/15, need to encourage consistent diet compliance  Monitor with increased mobility 11.  Tobacco abuse.  Continue NicoDerm patch as well as inhalers as directed.  Provide counseling 12.  Hyperlipidemia.  Lipitor 13.  Obesity.  BMI 32.98.  Dietary follow-up.  Encourage weight loss. 14.  Post stroke cognitive deficit             SLP for  higher-level cognition 15.  Slow transit constipation  KUB personally reviewed, unremarkable  Bowel meds increased on 11/12  Additional meds given x1 on 11/13, however patient did not take.  Discussed compliance with bowel meds  Improving 16.  Hyponatremia  Sodium 132 on 11/12, labs ordered for tomorrow  Continue to monitor 17.  Hypoalbuminemia  Supplement initiated on 11/13 18. Sleep disturbance  Melatonin started on 11/15   LOS: 4 days A FACE TO FACE EVALUATION WAS PERFORMED  Trayon Krantz Lorie Phenix 04/03/2019, 9:22 AM

## 2019-04-03 NOTE — Progress Notes (Signed)
Occupational Therapy Session Note  Patient Details  Name: Joan Webb MRN: [id number] Date of Birth: [birth year]  Today's Date: 04/03/2019 OT Individual Time: 1500-1601 OT Individual Time Calculation (min): 61 min   Short Term Goals: Week 1:  OT Short Term Goal 1 (Week 1): Pt will be able to step over tub ledge to shower chair with supervision. OT Short Term Goal 2 (Week 1): Pt will don shirt with set up OT Short Term Goal 3 (Week 1): Pt will navigate around room with supervision to obtain supplies for ADLs  Skilled Therapeutic Interventions/Progress Updates:    Pt greeted EOB with [name] present. Requesting to go outdoors for tx. Started session with ambulatory toilet transfer without AD and steady assist. Pt needed assist for clothing mgt on the Rt side. Able to complete hygiene herself post bladder void. After completing handwashing at the sink, pt was escorted to outdoor courtyard via w/c. Worked on ambulation in community setting, pt navigating over uneven terrain and up/down small inclines using RW with steady assist. Steady assist for transfer on and off of community bench. While seated, she completed A/AROM exercises for R UE as well as gentle stretches to neck. Pt reports pain in Rt shoulder, says that self gentle massages help her manage the pain independently. Unable to tolerate any shoulder abduction due to pain. At end of session pt was returned to her room via w/c. Left her EOB with all needs and [name] still present.    Therapy Documentation Precautions:  Precautions Precautions: Sternal, Fall, Other (comment) Precaution Booklet Issued: No Precaution Comments: R-side hemiparesis Restrictions Weight Bearing Restrictions: No Other Position/Activity Restrictions: sternal precautions Vital Signs: Therapy Vitals Temp: 97.6 F (36.4 C) Pulse Rate: 60 Resp: 19 BP: 113/64 Patient Position (if appropriate): Sitting Oxygen Therapy SpO2: 99 % O2 Device: Room  Air Pain: Pain Assessment Pain Scale: 0-10 Pain Score: 9  Pain Type: Surgical pain Pain Location: Chest Pain Orientation: Mid Pain Descriptors / Indicators: Sore Pain Frequency: Constant Patients Stated Pain Goal: 4 ADL:     Therapy/Group: Individual Therapy  [name] A [name] 04/03/2019, 4:35 PM

## 2019-04-04 ENCOUNTER — Inpatient Hospital Stay (HOSPITAL_COMMUNITY): Payer: Self-pay

## 2019-04-04 ENCOUNTER — Inpatient Hospital Stay (HOSPITAL_COMMUNITY): Payer: Self-pay | Admitting: Speech Pathology

## 2019-04-04 ENCOUNTER — Inpatient Hospital Stay (HOSPITAL_COMMUNITY): Payer: Self-pay | Admitting: Occupational Therapy

## 2019-04-04 ENCOUNTER — Encounter (HOSPITAL_COMMUNITY): Payer: Self-pay | Admitting: Psychology

## 2019-04-04 LAB — BASIC METABOLIC PANEL
Anion gap: 12 (ref 5–15)
BUN: 19 mg/dL (ref 6–20)
CO2: 28 mmol/L (ref 22–32)
Calcium: 9.1 mg/dL (ref 8.9–10.3)
Chloride: 92 mmol/L — ABNORMAL LOW (ref 98–111)
Creatinine, Ser: 0.61 mg/dL (ref 0.44–1.00)
GFR calc Af Amer: >60 mL/min (ref >60)
GFR calc non Af Amer: >60 mL/min (ref >60)
Glucose, Bld: 191 mg/dL — ABNORMAL HIGH (ref 70–99)
Potassium: 3.5 mmol/L (ref 3.5–5.1)
Sodium: 132 mmol/L — ABNORMAL LOW (ref 135–145)

## 2019-04-04 LAB — GLUCOSE, CAPILLARY
Glucose-Capillary: 236 mg/dL — ABNORMAL HIGH (ref 70–99)
Glucose-Capillary: 187 mg/dL — ABNORMAL HIGH (ref 70–99)
Glucose-Capillary: 164 mg/dL — ABNORMAL HIGH (ref 70–99)
Glucose-Capillary: 190 mg/dL — ABNORMAL HIGH (ref 70–99)

## 2019-04-04 NOTE — Progress Notes (Signed)
Speech Language Pathology Daily Session Note  Patient Details  Name: [name] MRN: [id number] Date of Birth: 1971/05/08  Today's Date: 04/04/2019 SLP Individual Time: 0730-0823 SLP Individual Time Calculation (min): 53 min  Short Term Goals: Week 1: SLP Short Term Goal 1 (Week 1): STG=LTG due to ELOS  Skilled Therapeutic Interventions: Pt was seen for skilled ST targeting cognitive skills. Pt requested to use restroom, therefore SLP provided Supervision A verbal cues for safety awareness (reasoning for use of walker) and assistance doffing and donning right side of pants. SLP further facilitated session with Min faded to Supervision A verbal cues for planning, problem solving, and organizing during a complex daily scheduling/time management task. During a novel semi-complex card game, Min A verbal cues were required for short term recall of rules, Supervision A for problem solving. Pt was left sitting at the edge of bed with RN present, all needs within reach. Continue per current plan of care.      Pain Pain Assessment Pain Score: 0-No pain  Therapy/Group: Individual Therapy  [name] 04/04/2019, 9:12 AM

## 2019-04-04 NOTE — Consult Note (Signed)
Neuropsychological Consultation   Patient:   Joan Webb   DOB:   [birth year]  MR Number:  [id number]  Location:  [facility] [address] CENTER [address] [id number] [place] [address] Dept: [phone] Loc: [phone]           Date of Service:   04/04/2019  Start Time:   10 AM End Time:   11 AM  Provider/Observer:  Arley Phenix, Psy.D.       Clinical Neuropsychologist       Billing Code/Service: [phone]  Chief Complaint:    Joan Webb is a 48 year old right-handed female with a history of diabetes and intermittent numbness bilateral hands/neuropathy, tobacco abuse and hyperlipidemia as well as obesity with BMI 32.98.  Patient presented 03/23/2019 with chest pain.  ECG performed showed inferior STEMI.  Code STEMI was activated.  Patient taken to Cath Lab demonstrating severe multivessel coronary artery disease.  Underwent CABG x3 on 03/25/2019.  On 03/27/2019 patient was found to have right arm weakness and numbness.  Cranial CT scan showed small focus of hypodensity within the left centrum semiovale/corona radiata.  MRI was not able to be performed due to 5 surgical staples.  Patient has now progressed and improved to the point of inpatient comprehensive rehabilitation unit placement due to reduced functional capacity.  Reason for Service:  The patient was referred for neuropsychological consultation due to coping and adjustment issues.  Below see HPI for the current admission.  HPI: Joan Webb is a 48 year old right-handed female with history of diabetes mellitus with intermittent numbness in bilateral hands, tobacco abuse and hyperlipidemia as well as obesity with BMI 32.98.  History taken from chart review, ex-husband, and patient.  Patient lives alone.  Independent prior to admission 1 level home with 2 steps to entry.  She does have children in the area who plan to provide assistance on discharge as well as an  ex-husband.  Presented 03/23/2019 with chest pain.  ECG performed showed inferior STEMI. Code STEMI was activated.  Noted sodium 131, glucose 338, troponin 24, WBC 14,300, SARS Covid negative.  Chest x-ray negative.  She was taken to the Cath Lab demonstrating severe multivessel coronary artery disease.  Underwent CABG x3 on 03/25/2019 per Dr. Maren Beach.  Hospital course complicated by pain and acute blood loss anemia of 8.3.  Sternal precautions as indicated.  On 03/27/2019 patient was found to have right arm weakness and numbness.  Cranial CT scan showed small focus of hypodensity within the left centrum semiovale/corona radiata.  CTA of the neck bilateral common carotid, internal carotid and vertebral arteries patent within the neck without significant stenosis.  CTA of head with no large vessel occlusion.  MRI was not able to be performed due to postsurgical staples.  Carotid Dopplers 1 to 39% stenosis.  Echocardiogram showed ejection fraction of 65%.  Patient's diet was slowly advanced to a regular consistency.  Epicardial wires removed 03/30/2019 and follow-up chest x-ray completed showing progressive bilateral pulmonary infiltrates/edema.  Progressive bibasilar atelectasis.  Patient remained on Lasix therapy as directed.. Neurology recommended patient be on ASA 81 and Plavix 75 x 3-week, then ASA alone.  Tolerating a regular consistency diet.  Therapy evaluations completed and patient was admitted for a comprehensive rehab program. Please see preadmission assessment from earlier today as well.   Current Status:  The patient reports that overall she is doing okay as far as coping and adjustment.  The patient reports that she does have  some persistent worries about how she is going to manage her medications and follow-up rehab efforts.  The patient reports that she is now walking but does have some continued limitations with her right leg.  The patient is quite pleased that she has full expressive language  capacity.  The patient reports that she continues to have significant motor deficits for her right arm and hand but does have full tactile sensation.  The patient is able to move all 5 fingers but has difficulty opening her hand back up.  Limited bicep muscle functioning.  The patient reports that she does have anxieties that are intrusive and we have been working on building coping skills and strategies to adjust to some of the intrusive thinking and worries that she has.  Most of this is completely appropriate given the situation.  The patient acknowledges a strong support staff including her ex-husband and children.  Behavioral Observation: [name]  presents as a 48 y.o.-year-old Right Caucasian Female who appeared her stated age. her dress was Appropriate and she was Well Groomed and her manners were Appropriate to the situation.  her participation was indicative of Appropriate and Attentive behaviors.  There were any physical disabilities noted.  she displayed an appropriate level of cooperation and motivation.     Interactions:    Active Appropriate and Attentive  Attention:   within normal limits and attention span and concentration were age appropriate  Memory:   within normal limits; recent and remote memory intact  Visuo-spatial:  not examined  Speech (Volume):  low  Speech:   normal; normal  Thought Process:  Coherent and Relevant  Though Content:  WNL; not suicidal and not homicidal  Orientation:   person, place, time/date and situation  Judgment:   Good  Planning:   Good  Affect:    Anxious  Mood:    Anxious  Insight:   Good  Intelligence:   normal  Medical History:   Past Medical History:  Diagnosis Date  . Anxiety   . Coronary artery disease   . Diabetes (HCC)   . Hyperlipidemia   . Myocardial infarction (HCC)   . Tobacco abuse    Psychiatric History:  Patient does have a history of anxiety and reports that her anxiety has been exacerbated by this  current status.  The patient reports that she has been worried about follow-up, medications, her family, and her ability to return back to functioning life including driving and other activities.  Family Med/Psych History:  Family History  Problem Relation Age of Onset  . Heart attack Father     Risk of Suicide/Violence: virtually non-existent   Impression/DX:  Quinnley Eastland is a 48 year old right-handed female with a history of diabetes and intermittent numbness bilateral hands/neuropathy, tobacco abuse and hyperlipidemia as well as obesity with BMI 32.98.  Patient presented 03/23/2019 with chest pain.  ECG performed showed inferior STEMI.  Code STEMI was activated.  Patient taken to Cath Lab demonstrating severe multivessel coronary artery disease.  Underwent CABG x3 on 03/25/2019.  On 03/27/2019 patient was found to have right arm weakness and numbness.  Cranial CT scan showed small focus of hypodensity within the left centrum semiovale/corona radiata.  MRI was not able to be performed due to 5 surgical staples.  Patient has now progressed and improved to the point of inpatient comprehensive rehabilitation unit placement due to reduced functional capacity.  The patient reports that overall she is doing okay as far as coping and adjustment.  The  patient reports that she does have some persistent worries about how she is going to manage her medications and follow-up rehab efforts.  The patient reports that she is now walking but does have some continued limitations with her right leg.  The patient is quite pleased that she has full expressive language capacity.  The patient reports that she continues to have significant motor deficits for her right arm and hand but does have full tactile sensation.  The patient is able to move all 5 fingers but has difficulty opening her hand back up.  Limited bicep muscle functioning.  The patient reports that she does have anxieties that are intrusive and we have been  working on building coping skills and strategies to adjust to some of the intrusive thinking and worries that she has.  Most of this is completely appropriate given the situation.  The patient acknowledges a strong support staff including her ex-husband and children.  Disposition/Plan:  Worked on coping issues strategies for dealing with her anxiety and adjusting to her post stroke status.  The patient has continued to be motivated to make significant functional gains but does have a lot of worries about how she will manage things particularly with being uninsured and continue to get her medications.  Diagnosis:    Subcortical infarction [facility]) - Plan: Ambulatory referral to Neurology  Constipation - Plan: DG Abd 1 View, DG Abd 1 View         Electronically Signed   _______________________ Ilean Skill, Psy.D.

## 2019-04-04 NOTE — Progress Notes (Signed)
[place] PHYSICAL MEDICINE & REHABILITATION PROGRESS NOTE  Subjective/Complaints: Patient seen sitting up in bed this AM.  She enjoyed going outside with therapies and does not like being in her room most of the day. She is eager to go home. She notes ambulation without RW.  She notes she had a BM after receiving enema two days ago. She is aware of her sternal precautions and reports some chest pain, will be receiving oxycodone for pain relief from her nurse now.   ROS: Denies SOB, N/V/D  Objective: Vital Signs: Blood pressure 110/66, pulse 85, temperature 97.7 F (36.5 C), resp. rate 16, height 5\' 2"  (1.575 m), weight 81.1 kg, SpO2 96 %. No results found. No results for input(s): WBC, HGB, HCT, PLT in the last 72 hours. No results for input(s): NA, K, CL, CO2, GLUCOSE, BUN, CREATININE, CALCIUM in the last 72 hours.  Physical Exam: BP 110/66 (BP Location: Right Arm)   Pulse 85   Temp 97.7 F (36.5 C)   Resp 16   Ht 5\' 2"  (1.575 m)   Wt 81.1 kg   SpO2 96%   BMI 32.70 kg/m  Constitutional: No distress . Vital signs reviewed. Sitting up in bed.  HENT: Normocephalic.  Atraumatic. Eyes: EOMI. No discharge. Cardiovascular: No JVD. Respiratory: Normal effort.  No stridor. GI: Non-distended. Skin: Midline chest incision C/D/I Psych: Normal mood.  Normal behavior. Musc: No edema in extremities.  No tenderness in extremities. Neuro: Alert  Makes good eye contact.   Follows commands.   Motor: Left upper extremity/left lower extremity: 5/5 proximal distal Right upper extremity: Shoulder abduction elbow flexion/extension 3/5, wrist extension 0/5, handgrip 1+ /5, stable Right lower extremity: 4+/5 proximal to distal, stable Some increased tone in right hand  Assessment/Plan: 1. Functional deficits secondary to left cerebral infarct which require 3+ hours per day of interdisciplinary therapy in a comprehensive inpatient rehab setting.  Physiatrist is providing close team supervision  and 24 hour management of active medical problems listed below.  Physiatrist and rehab team continue to assess barriers to discharge/monitor patient progress toward functional and medical goals  Care Tool:  Bathing  Bathing activity did not occur: Refused Body parts bathed by patient: Right arm, Abdomen, Chest, Front perineal area, Buttocks, Right upper leg, Left upper leg, Right lower leg, Left lower leg, Face   Body parts bathed by helper: Left arm     Bathing assist Assist Level: Minimal Assistance - Patient > 75%     Upper Body Dressing/Undressing Upper body dressing Upper body dressing/undressing activity did not occur (including orthotics): (she'd already dressed for the day) What is the patient wearing?: Button up shirt Orthosis activity level: (Pt assisted)  Upper body assist Assist Level: Moderate Assistance - Patient 50 - 74%    Lower Body Dressing/Undressing Lower body dressing      What is the patient wearing?: Pants     Lower body assist Assist for lower body dressing: Minimal Assistance - Patient > 75%     Toileting Toileting Toileting Activity did not occur Landscape architect and hygiene only): Refused  Toileting assist Assist for toileting: Supervision/Verbal cueing     Transfers Chair/bed transfer  Transfers assist     Chair/bed transfer assist level: Contact Guard/Touching assist     Locomotion Ambulation   Ambulation assist      Assist level: Contact Guard/Touching assist Assistive device: Walker-rolling Max distance: 164ft   Walk 10 feet activity   Assist     Assist level: Contact Guard/Touching  assist Assistive device: Walker-rolling   Walk 50 feet activity   Assist Walk 50 feet with 2 turns activity did not occur: Safety/medical concerns(pt fatigue)  Assist level: Contact Guard/Touching assist Assistive device: Walker-rolling    Walk 150 feet activity   Assist Walk 150 feet activity did not occur: Safety/medical  concerns(pt fatigue)  Assist level: Contact Guard/Touching assist Assistive device: Walker-rolling    Walk 10 feet on uneven surface  activity   Assist     Assist level: Minimal Assistance - Patient > 75%     Wheelchair     Assist Will patient use wheelchair at discharge?: No   Wheelchair activity did not occur: Safety/medical concerns(sternal precautions)         Wheelchair 50 feet with 2 turns activity    Assist            Wheelchair 150 feet activity     Assist            Medical Problem List and Plan: 1.  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  Sternal precautions  Continue CIR  WHO for RUE 2.  Antithrombotics: -DVT/anticoagulation: SCDs.             -antiplatelet therapy: Aspirin 81 mg daily, Plavix 25 mg daily x3 weeks then aspirin alone 3. Pain Management: Neurontin 300 mg twice daily, oxycodone/Ultram as needed, continues to use for sternal pain, well controlled with medication.  4. Mood: Provide emotional support             -antipsychotic agents: N/A 5. Neuropsych: This patient is not fully capable of making decisions on her own behalf. 6. Skin/Wound Care: Routine skin checks 7. Fluids/Electrolytes/Nutrition: Routine in and outs.   8.  Acute blood loss anemia.    Hemoglobin 9.5 on 11/12  Continue to monitor 9.  Hypertension.  Lopressor 25 mg twice daily, Lasix 40 mg  Daily  Relatively controlled on 11/15  Monitor with increased mobility 10.  Diabetes mellitus with peripheral neuropathy.  Hemoglobin A1c 7.9.  Glucotrol 10 mg twice daily, Levemir 10 units twice daily.  Check blood sugars before meals and at bedtime  Labile on 11/15, need to encourage consistent diet compliance  Monitor with increased mobility 11.  Tobacco abuse.  Continue NicoDerm patch as well as inhalers as directed.  Provide  counseling 12.  Hyperlipidemia.  Lipitor 13.  Obesity.  BMI 32.98.  Dietary follow-up.  Encourage weight loss. 14.  Post stroke cognitive deficit             SLP for higher-level cognition 15.  Slow transit constipation  KUB personally reviewed, unremarkable  Bowel meds increased on 11/12  Additional meds given x1 on 11/13, however patient did not take.  Discussed compliance with bowel meds  Improving  Had BM with enema this weekend.  16.  Hyponatremia  Sodium 132 on 11/12  Continue to monitor 17.  Hypoalbuminemia  Supplement initiated on 11/13 18. Sleep disturbance  Melatonin started on 11/15 19. Disposition: Patient is eager to go home. Team conference on Wednesday.    LOS: 5 days A FACE TO FACE EVALUATION WAS PERFORMED  Joan Webb 04/04/2019, 8:35 AM

## 2019-04-04 NOTE — Progress Notes (Signed)
  Subjective: Patient feeling better, GI symptoms have resolved No angina or shortness of breath Sternal wound incision healing clean and dry No peripheral edema Some return of right hand motor function, ambulating well  Objective: Vital signs in last 24 hours: Temp:  [97.6 F (36.4 C)-97.7 F (36.5 C)] 97.7 F (36.5 C) (11/16 0430) Pulse Rate:  [60-90] 85 (11/16 0430) Resp:  [16-20] 16 (11/16 0430) BP: (86-113)/(54-66) 110/66 (11/16 0430) SpO2:  [96 %-99 %] 96 % (11/16 0430) Weight:  [81.1 kg] 81.1 kg (11/16 0442)  Hemodynamic parameters for last 24 hours:  Sinus rhythm  Intake/Output from previous day: 11/15 0701 - 11/16 0700 In: 240 [P.O.:240] Out: -  Intake/Output this shift: Total I/O In: 240 [P.O.:240] Out: -   Exam Alert comfortable Lungs clear Heart rhythm regular without murmur or gallop Sternum stable minimal tenderness Leg incision well-healed no peripheral edema Right hand and right arm weakness, slowly proving  Lab Results: No results for input(s): WBC, HGB, HCT, PLT in the last 72 hours. BMET: No results for input(s): NA, K, CL, CO2, GLUCOSE, BUN, CREATININE, CALCIUM in the last 72 hours.  PT/INR: No results for input(s): LABPROT, INR in the last 72 hours. ABG    Component Value Date/Time   PHART 7.358 03/26/2019 0431   HCO3 21.5 03/26/2019 0431   TCO2 23 03/26/2019 0431   ACIDBASEDEF 3.0 (H) 03/26/2019 0431   O2SAT 56.8 03/30/2019 0950   CBG (last 3)  Recent Labs    04/03/19 1726 04/03/19 2032 04/04/19 0625  GLUCAP 164* 223* 236*    Assessment/Plan: S/P Multivessel CABG with perioperative CVA Appreciate multidisciplinary rehab support and therapy. We will follow and arrange outpatient office visit after discharge.   LOS: 5 days    Tharon Aquas Trigt III 04/04/2019

## 2019-04-04 NOTE — Progress Notes (Signed)
Occupational Therapy Session Note  Patient Details  Name: Joan Webb MRN: [id number] Date of Birth: Oct 03, 1970  Today's Date: 04/04/2019 OT Individual Time: 8185-6314 OT Individual Time Calculation (min): 56 min    Short Term Goals: Week 1:  OT Short Term Goal 1 (Week 1): Pt will be able to step over tub ledge to shower chair with supervision. OT Short Term Goal 2 (Week 1): Pt will don shirt with set up OT Short Term Goal 3 (Week 1): Pt will navigate around room with supervision to obtain supplies for ADLs  Skilled Therapeutic Interventions/Progress Updates:    Treatment session with focus on activity tolerance during self-care retraining.  Pt received upright in bed expressing desire to shower.  Pt ambulated to toilet with RW with supervision.  Once approaching toilet, pt left RW aside and backed up to toilet without AD with CGA.  Pt completed toileting tasks with supervision then ambulated to room shower without AD with CGA.  Engaged in bathing at sit > stand level in shower with education on energy conservation strategies and use of shower seat during bathing to allow for rest breaks as needed.  Assisted with washing hair and buttocks due to fatigue and RUE weakness. Pt reports fatigue post shower.  Ambulated to EOB without AD with CGA.  Pt completed LB dressing at sit > stand level with assist to pull shorts over Rt hip.  Pt required assistance when donning binder type top due to inability to pull elastic tight enough to secure and then provided assistance with donning open front sweater.  Pt remained seated EOB with all needs in reach.  Therapy Documentation Precautions:  Precautions Precautions: Sternal, Fall, Other (comment) Precaution Booklet Issued: No Precaution Comments: R-side hemiparesis Restrictions Weight Bearing Restrictions: No Other Position/Activity Restrictions: sternal precautions General:   Vital Signs: Therapy Vitals Temp: 98.6 F (37 C) Temp Source:  Oral Pulse Rate: 86 Resp: 20 BP: (!) 107/59 Patient Position (if appropriate): Sitting Oxygen Therapy SpO2: 97 % O2 Device: Room Air Pain: Pain Assessment Pain Scale: 0-10 Pain Score: 5  Faces Pain Scale: Hurts a little bit Pain Type: Surgical pain Pain Location: Chest Pain Frequency: Intermittent Pain Onset: On-going Pain Intervention(s): Medication (See eMAR);Repositioned   Therapy/Group: Individual Therapy  Simonne Come 04/04/2019, 3:24 PM

## 2019-04-04 NOTE — Progress Notes (Signed)
Physical Therapy Session Note  Patient Details  Name: Joan Webb MRN: [id number] Date of Birth: Sep 30, 1970  Today's Date: 04/04/2019 PT Individual Time: 0920-1000 and 1130-1200 PT Individual Time Calculation (min): 40 min and 30 min  Short Term Goals: Week 1:  PT Short Term Goal 1 (Week 1): = LTGs due to ELOS  Skilled Therapeutic Interventions/Progress Updates:   Treatment Session 1: 0920-1000 40 min Received pt supine in bed, pt stated PICC line was just removed and she was instructed not to get OOB, pt reported pain 8/10 along sternal incision. RN delivered pain medication and confirmed pt restricted to bed activities for next 30 minutes. Pt stated "I don't feel like doing anything anyway". Pt requested going outside and stated she went out twice over the weekend, PT instructed pt that she needed to remain in bed for next 30 minutes due to PICC line removal. PT suggested supine exercises, pt denied and stated "my chest hurts when I lay flat". In semi reclined position pt performed the following exercises: bilateral marching 1x20, bilateral SLR 1x20, bilateral resisted hip extension 2x12, bilateral hip abduction 1x20, and hip adduction pillow squeezes 2x20 with 3 second hold. Pt required verbal cues with all exercises for technique and eccentric control with movement. Pt attempted bridges but stopped due to increased pain in chest. Pt transferred semi reclined<>sitting EOB with supervision to drink coffee. When asked to perform LE strengthening exercises sitting EOB pt declined and requested to recline back in bed. While sitting EOB, pt performed RUE finger flexion 1x5 reps and RUE finger opposition x 1 trial with assist from LUE and verbal cues for technique. Pt remained distracted throughout session and required moderate verbal cues to remain on task and engage in therapy. Concluded session with pt semi reclined in bed, needs within reach, and bed alarm on.   Treatment Session 2: 1130-1200 30  min Received pt semi-reclined in bed, pt agreeable to PT, and stated pain in chest but did not state pain level. Pt requesting to go outside but PT suggested walking around the unit due to 30 minute time limit. Session focused on functional mobility/transfers, ambulation, LE strength, balance/coordiation, and improved tolerance to activity. Pt performed bed mobility with supervision. Pt ambulated 150ft x2 trials to and from therapy gym with RW supervision. Pt navigated 1 curb with RW CGA. Pt required verbal cues for technique, RW safety, and step sequence. Pt performed sit<>stands on Airex 1x10 with bilateral UE support and 1x10 without UE support CGA. Concluded session with pt sitting EOB with ex-husband present, needs within reach, and bed alarm on.    Therapy Documentation Precautions:  Precautions Precautions: Sternal, Fall, Other (comment) Precaution Booklet Issued: No Precaution Comments: R-side hemiparesis Restrictions Weight Bearing Restrictions: No Other Position/Activity Restrictions: sternal precautions  Therapy/Group: Individual Therapy Alfonse Alpers PT, DPT   04/04/2019, 7:41 AM

## 2019-04-04 NOTE — Plan of Care (Signed)
  Problem: Consults Goal: RH STROKE PATIENT EDUCATION Description: See Patient Education module for education specifics  Outcome: Progressing   Problem: Consults Goal: Diabetes Guidelines if Diabetic/Glucose > 140 Description: If diabetic or lab glucose is > 140 mg/dl - Initiate Diabetes/Hyperglycemia Guidelines & Document Interventions  Outcome: Progressing   Problem: RH BOWEL ELIMINATION Goal: RH STG MANAGE BOWEL WITH ASSISTANCE Description: STG Manage Bowel with mod I Assistance. Outcome: Progressing Flowsheets (Taken 04/04/2019 1800) STG: Pt will manage bowels with assistance: 5-Supervision/curing Goal: RH STG MANAGE BOWEL W/MEDICATION W/ASSISTANCE Description: STG Manage Bowel with Medication with mod I Assistance. Outcome: Progressing Flowsheets (Taken 04/04/2019 1800) STG: Pt will manage bowels with medication with assistance: 5-Supervision/cueing   Problem: RH SAFETY Goal: RH STG ADHERE TO SAFETY PRECAUTIONS W/ASSISTANCE/DEVICE Description: STG Adhere to Safety Precautions With cues/reminders Assistance/Device. Outcome: Progressing Flowsheets (Taken 04/04/2019 1800) STG:Pt will adhere to safety precautions with assistance/device: 5-Supervision/cueing   Problem: RH PAIN MANAGEMENT Goal: RH STG PAIN MANAGED AT OR BELOW PT'S PAIN GOAL Description: Less than 4 on 0-10 scale  Outcome: Progressing   Problem: RH KNOWLEDGE DEFICIT Goal: RH STG INCREASE KNOWLEDGE OF DIABETES Description: Pt/family will be able to verbalize management of DM with diet and medication regimen prior to DC using handouts/education teaching.   Outcome: Progressing Goal: RH STG INCREASE KNOWLEDGE OF HYPERTENSION Description: Pt/family will be able to verbalize management of HTN with diet and medication regimen prior to DC using handouts/education teaching.   Outcome: Progressing Goal: RH STG INCREASE KNOWLEGDE OF HYPERLIPIDEMIA Description: Pt/family will be able to verbalize management of HDL  with diet and medication regimen prior to DC using handouts/education teaching.   Outcome: Progressing Goal: RH STG INCREASE KNOWLEDGE OF STROKE PROPHYLAXIS Description: Pt/family will be able to verbalize signs of stroke and ways to prevent reoccurrence with diet and medication regimen prior to DC  Outcome: Progressing

## 2019-04-05 ENCOUNTER — Inpatient Hospital Stay (HOSPITAL_COMMUNITY): Payer: Self-pay | Admitting: Occupational Therapy

## 2019-04-05 ENCOUNTER — Inpatient Hospital Stay (HOSPITAL_COMMUNITY): Payer: Self-pay | Admitting: Speech Pathology

## 2019-04-05 ENCOUNTER — Inpatient Hospital Stay (HOSPITAL_COMMUNITY): Payer: Self-pay

## 2019-04-05 ENCOUNTER — Inpatient Hospital Stay (HOSPITAL_COMMUNITY): Payer: Medicaid Other

## 2019-04-05 DIAGNOSIS — M7989 Other specified soft tissue disorders: Secondary | ICD-10-CM

## 2019-04-05 DIAGNOSIS — R52 Pain, unspecified: Secondary | ICD-10-CM

## 2019-04-05 DIAGNOSIS — G479 Sleep disorder, unspecified: Secondary | ICD-10-CM

## 2019-04-05 DIAGNOSIS — R609 Edema, unspecified: Secondary | ICD-10-CM

## 2019-04-05 DIAGNOSIS — E1165 Type 2 diabetes mellitus with hyperglycemia: Secondary | ICD-10-CM

## 2019-04-05 LAB — GLUCOSE, CAPILLARY
Glucose-Capillary: 207 mg/dL — ABNORMAL HIGH (ref 70–99)
Glucose-Capillary: 189 mg/dL — ABNORMAL HIGH (ref 70–99)
Glucose-Capillary: 177 mg/dL — ABNORMAL HIGH (ref 70–99)
Glucose-Capillary: 203 mg/dL — ABNORMAL HIGH (ref 70–99)

## 2019-04-05 MED ORDER — INSULIN DETEMIR 100 UNIT/ML ~~LOC~~ SOLN
13.0000 [IU] | Freq: Two times a day (BID) | SUBCUTANEOUS | Status: DC
  Administered 2019-04-05 – 2019-04-09 (×8): 13 [IU] via SUBCUTANEOUS
  Filled 2019-04-05 (×9): qty 0.13

## 2019-04-05 MED ORDER — ENOXAPARIN SODIUM 80 MG/0.8ML ~~LOC~~ SOLN
80.0000 mg | Freq: Two times a day (BID) | SUBCUTANEOUS | Status: DC
  Administered 2019-04-05 – 2019-04-06 (×2): 80 mg via SUBCUTANEOUS
  Filled 2019-04-05 (×2): qty 0.8

## 2019-04-05 NOTE — Progress Notes (Signed)
[place] PHYSICAL MEDICINE & REHABILITATION PROGRESS NOTE  Subjective/Complaints: Patient seen ambulating from the restroom to her bed this morning about work with therapies.  Reminded patient regarding safety and to call for assistance.  Patient initially states that she slept well overnight, but then states that she did not sleep well.  She notes left arm pain after her line was removed yesterday.  Noted to be impulsive.  She was seen by CTS yesterday, notes reviewed.  ROS: + Left arm pain, chest wall pain.  Denies SOB, N/V/D  Objective: Vital Signs: Blood pressure 100/60, pulse 78, temperature 98 F (36.7 C), temperature source Oral, resp. rate 17, height 5\' 2"  (1.575 m), weight 81.1 kg, SpO2 100 %. No results found. No results for input(s): WBC, HGB, HCT, PLT in the last 72 hours. Recent Labs    04/04/19 1035  NA 132*  K 3.5  CL 92*  CO2 28  GLUCOSE 191*  BUN 19  CREATININE 0.61  CALCIUM 9.1    Physical Exam: BP 100/60 (BP Location: Right Arm)   Pulse 78   Temp 98 F (36.7 C) (Oral)   Resp 17   Ht 5\' 2"  (1.575 m)   Wt 81.1 kg   SpO2 100%   BMI 32.70 kg/m  Constitutional: No distress . Vital signs reviewed. HENT: Normocephalic.  Atraumatic. Eyes: EOMI. No discharge. Cardiovascular: No JVD. Respiratory: Normal effort.  No stridor. GI: Non-distended. Skin: Midline chest incision C/D/I Psych: Normal mood.  Normal behavior. Musc: Left upper extremity with significant edema and tenderness  Neuro: Alert Makes good eye contact.   Follows commands.   Motor: Left upper extremity/left lower extremity: 5/5 proximal distal Right upper extremity: Shoulder abduction elbow flexion/extension 3/5, wrist extension 3-/5, handgrip 3+ /5, with apraxia Right lower extremity: 4+/5 proximal to distal, unchanged Some increased tone in right hand  Assessment/Plan: 1. Functional deficits secondary to left cerebral infarct which require 3+ hours per day of interdisciplinary therapy  in a comprehensive inpatient rehab setting.  Physiatrist is providing close team supervision and 24 hour management of active medical problems listed below.  Physiatrist and rehab team continue to assess barriers to discharge/monitor patient progress toward functional and medical goals  Care Tool:  Bathing  Bathing activity did not occur: Refused Body parts bathed by patient: Right arm, Abdomen, Chest, Front perineal area, Right upper leg, Left upper leg, Right lower leg, Left lower leg, Face   Body parts bathed by helper: Buttocks     Bathing assist Assist Level: Minimal Assistance - Patient > 75%     Upper Body Dressing/Undressing Upper body dressing Upper body dressing/undressing activity did not occur (including orthotics): (she'd already dressed for the day) What is the patient wearing?: Button up shirt Orthosis activity level: (Pt assisted)  Upper body assist Assist Level: Maximal Assistance - Patient 25 - 49%    Lower Body Dressing/Undressing Lower body dressing      What is the patient wearing?: Pants     Lower body assist Assist for lower body dressing: Minimal Assistance - Patient > 75%     Toileting Toileting Toileting Activity did not occur Landscape architect and hygiene only): Refused  Toileting assist Assist for toileting: Supervision/Verbal cueing     Transfers Chair/bed transfer  Transfers assist     Chair/bed transfer assist level: Supervision/Verbal cueing(RW)     Locomotion Ambulation   Ambulation assist      Assist level: Supervision/Verbal cueing Assistive device: Walker-rolling Max distance: 168ft   Walk 10  feet activity   Assist     Assist level: Supervision/Verbal cueing Assistive device: Walker-rolling   Walk 50 feet activity   Assist Walk 50 feet with 2 turns activity did not occur: Safety/medical concerns(pt fatigue)  Assist level: Supervision/Verbal cueing Assistive device: Walker-rolling    Walk 150 feet  activity   Assist Walk 150 feet activity did not occur: Safety/medical concerns(pt fatigue)  Assist level: Supervision/Verbal cueing Assistive device: Walker-rolling    Walk 10 feet on uneven surface  activity   Assist Walk 10 feet on uneven surfaces activity did not occur: Safety/medical concerns(Fatigue per report )   Assist level: Minimal Assistance - Patient > 75%     Wheelchair     Assist Will patient use wheelchair at discharge?: No   Wheelchair activity did not occur: Safety/medical concerns(sternal precautions)         Wheelchair 50 feet with 2 turns activity    Assist            Wheelchair 150 feet activity     Assist            Medical Problem List and Plan: 1.  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  Sternal precautions  Continue CIR, no range of motion to left upper extremity  WHO for RUE 2.  Antithrombotics: -DVT/anticoagulation: SCDs.  Will order Doppler of left upper extremity             -antiplatelet therapy: Aspirin 81 mg daily, Plavix 25 mg daily x3 weeks then aspirin alone 3. Pain Management: Neurontin 300 mg twice daily, oxycodone/Ultram as needed, continues to use for sternal pain, well controlled with medication.  4. Mood: Provide emotional support             -antipsychotic agents: N/A 5. Neuropsych: This patient is not fully capable of making decisions on her own behalf. 6. Skin/Wound Care: Routine skin checks 7. Fluids/Electrolytes/Nutrition: Routine in and outs.   8.  Acute blood loss anemia.    Hemoglobin 9.5 on 11/12  Continue to monitor 9.  Hypertension.  Lopressor 25 mg twice daily, Lasix 40 mg  Daily  Relatively controlled on 11/17  Monitor with increased mobility 10.  Diabetes mellitus with peripheral neuropathy with hyperglycemia.  Hemoglobin A1c 7.9.    Glucotrol 10 mg  twice daily  Levemir 10 units twice daily, increase to 13 twice daily on 11/17.    Check blood sugars before meals and at bedtime  Elevated on 11/17, need to encourage consistent diet compliance  Monitor with increased mobility 11.  Tobacco abuse.  Continue NicoDerm patch as well as inhalers as directed.  Provide counseling 12.  Hyperlipidemia.  Lipitor 13.  Obesity.  BMI 32.98.  Dietary follow-up.  Encourage weight loss. 14.  Post stroke cognitive deficit             SLP for higher-level cognition 15.  Slow transit constipation  KUB personally reviewed, unremarkable  Bowel meds increased on 11/12  Additional meds given x1 on 11/13, however patient did not take.  Discussed compliance with bowel meds  Improving 16.  Hyponatremia  Sodium 132 on 11/16  Continue to monitor 17.  Hypoalbuminemia  Supplement initiated on 11/13 18. Sleep disturbance  Melatonin started on 11/15  Appears to be improving  LOS: 6 days A FACE TO FACE EVALUATION WAS PERFORMED   Karis Juba 04/05/2019, 8:33 AM

## 2019-04-05 NOTE — Progress Notes (Signed)
Physical Therapy Session Note  Patient Details  Name: Joan Webb MRN: [id number] Date of Birth: 02/14/1971  Today's Date: 04/05/2019 PT Individual Time: 1301-1359 PT Individual Time Calculation (min): 58 min   Short Term Goals: Week 1:  PT Short Term Goal 1 (Week 1): = LTGs due to ELOS  Skilled Therapeutic Interventions/Progress Updates:   Received pt sitting EOB eating lunch with ex-husband, pt denied ay pain, and agreeable to PT. Session focused on functional mobility/transfers, ambulation, stair navigation, family education, LE strength, balance/coordination, and improved activity tolerance. Pt ambulated 50ft with RW to bathroom with supervision. Pt required min A for LE clothing management and performed toilet transfer with supervision. Pt stood at sink and washed hands with supervision. Pt ambulated 16ft x2 trials with RW supervision to and from therapy gym. Pt navigated 12 steps with 1 rail CGA with therapist and navigated 12 steps with 1 rail CGA with pt's ex-husband CGA. Pt ascended and descended with a step through pattern. Pt required verbal cues to slow pace. Inside parallel bars pt performed forwards/backwards tandem walking x2 laps with and without bilateral UE support on rail CGA. Pt performed bilateral SLS x 4 trials for approximately 20 seconds each without UE support CGA. Pt with decreased balance R>L. Pt performed standing wobbleboard static balance without UE support 5x20 second hold CGA. Pt performed alternating wobbleboard taps 3x10 with and without UE support CGA/min A for balance. Pt performed 2x10 squats on Airex with 1x10 bilateral UE support and 1x10 without UE support. Concluded session with pt sitting EOB, husband present in room, and bed alarm not on. Safety plan updated and pt/family verbalized understanding that pt is allowed to ambulate in room only using RW with ex-husband's supervision. RN made aware.   Therapy Documentation Precautions:   Precautions Precautions: Sternal, Fall, Other (comment) Precaution Booklet Issued: No Precaution Comments: R-side hemiparesis Restrictions Weight Bearing Restrictions: No Other Position/Activity Restrictions: sternal precautions  Therapy/Group: Individual Therapy Alfonse Alpers PT, DPT   04/05/2019, 7:36 AM

## 2019-04-05 NOTE — Progress Notes (Signed)
Discussed with Dr Dagoberto Ligas Dr. Doren Custard ( Vascular) and Dr. Rory Percy recommendation. We will discontinue Plavix and order Lovenox treatment dose. This order was given to Abigail Miyamoto RN, she verbalizes understanding.

## 2019-04-05 NOTE — Progress Notes (Signed)
Left upper extremity venous duplex complete.  Please see CV Proc tab for preliminary results.  Critical results given to RN, Caryl Pina @ 17:00, she stated that she will contact MD. Lita Mains- RDMS, RVT 5:01 PM  04/05/2019

## 2019-04-05 NOTE — Progress Notes (Signed)
Placed a call to Dr. Dagoberto Ligas, discussed the doppler results, she asked if this provider would call Vascular.  Plaved a call to Dr. Doren Custard : Vascular he recommended anti-coagulation.  Dr. Posey Pronto note was reviewed Ms. Durflinger had left cerebral infarct. Ths provider placed a call to Dr. Rory Percy: [facility]  regarding anticoagulation. This will be discussed with Dr. Dagoberto Ligas.

## 2019-04-05 NOTE — Progress Notes (Signed)
Speech Language Pathology Daily Session Note  Patient Details  Name: Aamilah Augenstein MRN: [id number] Date of Birth: 1970/08/09  Today's Date: 04/05/2019 SLP Individual Time: 1007-1219 SLP Individual Time Calculation (min): 56 min  Short Term Goals: Week 1: SLP Short Term Goal 1 (Week 1): STG=LTG due to ELOS  Skilled Therapeutic Interventions: Pt was seen for skilled ST targeting cognitive goals. Upon entrance to room pt requested to use restroom; pt ambulated to toilet with RW with Supervision A verbal cues for safety awareness; pt more agreeable to use RW than previous sessions with ST. In functional conversation, pt reported feeling ready to go home, however anxious regarding ability to complete grocery shopping and pick up prescriptions. Supervision to Min A question cues provided for problem solving plan for assistance with those tasks at discharge. Pt also demonstrated improving carryover/recall of new medication functions when provided name (~60% accurate). SLP further facilitated session with Min faded to Supervision A verbal cues for recall and use of compensatory memory strategies (associations) during a novel memory card task. Pt was left laying in bed with alarm set and all needs within reach. Continue per current plan of care.        Pain Pain Assessment Pain Score: 0-No pain  Therapy/Group: Individual Therapy  [name] 04/05/2019, 10:51 AM

## 2019-04-05 NOTE — Progress Notes (Addendum)
ANTICOAGULATION CONSULT NOTE - Initial Consult  Pharmacy Consult for Enoxaparin Indication: Upper Extremity DVT  No Known Allergies  Patient Measurements: Height: 5\' 2"  (157.5 cm) Weight: 178 lb 12.7 oz (81.1 kg) IBW/kg (Calculated) : 50.1   Vital Signs: Temp: 98.8 F (37.1 C) (11/17 1959) Temp Source: Oral (11/17 1959) BP: 101/59 (11/17 1959) Pulse Rate: 76 (11/17 1959)  Labs: Recent Labs    04/04/19 1035  CREATININE 0.61    Estimated Creatinine Clearance: 83.9 mL/min (by C-G formula based on SCr of 0.61 mg/dL).   Medical History: Past Medical History:  Diagnosis Date  . Anxiety   . Coronary artery disease   . Diabetes ([place])   . Hyperlipidemia   . Myocardial infarction ([place])   . Tobacco abuse     Assessment: 48 yr old female with new upper extremity DVT (Doppler findings consistent with acute deep vein thrombosis involving the R subclavian vein and R axillary vein; also, findings consistent with acute superficial vein thrombosis involving the R basilic vein and R cephalic vein).   Pt diagnosed with STEMI on 03/23/19 and underwent cardiac bypass surgery 03/25/19. She has R-sided weakness with numbness secondary to L subcortical lacunar infarction secondary to small vessel disease after CABG. Small lacunar infarct is over a week old and is at very minimal risk of hemorrhagic transformation, per Neurology, who recommended anticoagulation for treating LUE DVT plus aspirin (and stopping clopidogrel).  H/H 9.5/29.1 (stable), platelets 343; Scr 0.61, TBW CrCl >100 ml/min  Goal of Therapy:  Prevention of LUE DVT extension Monitor platelets by anticoagulation protocol: Yes   Plan:  Enoxaparin 1 mg/kg (80 mg) SQ Q 12 hrs Monitor daily CBC Monitor renal function Monitor for signs/symptoms of bleeding  Gillermina Hu, PharmD, BCPS, [facility] Clinical Pharmacist 04/05/2019,9:11 PM

## 2019-04-05 NOTE — Progress Notes (Addendum)
Vascular doppler results called to Danella Sensing on call. Awaiting follow up.

## 2019-04-05 NOTE — Progress Notes (Signed)
Received a call from [name], we discussed [name]  Vascular Doppler results. Dr. Posey Pronto note was reviewed. Placed a call to Dr. Posey Pronto awaiting a return call.

## 2019-04-05 NOTE — Plan of Care (Addendum)
Got a call from E. Marcello Moores, NP from Rehab. Pt will need anticoagulation for DVT. From a neurological stroke standpoint, it is OK to anticoagulate. She had a small lacunar infarction which is atleast over a week old and at very minimal risk of hemorrhagic transformation. Do not use DAPT with anticoagulant. Use ASA with anticoag. Please call stroke team in the morning should you have more questions.  -- Amie Portland, MD [facility] Pager: [phone] If 7pm to 7am, please call on call as listed on AMION.

## 2019-04-05 NOTE — Progress Notes (Signed)
Speech Language Pathology Daily Session Note  Patient Details  Name: Joan Webb MRN: [id number] Date of Birth: 1970-11-27  Today's Date: 04/05/2019 SLP Individual Time: 1420-1503 SLP Individual Time Calculation (min): 43 min  Short Term Goals: Week 1: SLP Short Term Goal 1 (Week 1): STG=LTG due to ELOS  Skilled Therapeutic Interventions: Pt was seen for skilled ST targeting cognitive skills. SLP facilitated session with functional conversation regarding current progress in attempt to gauge pt's intellectual and anticipatory awareness. Pt clearly identified perceived physical and cognitive limitations, and specifically noting bathing and dressing as biggest challenges. She also reported she still feels unorganized and different from baseline in terms of cognitive function. Pt is confident she will have 24/7 supervision between family members and friends. She demonstrated good safety awareness in her identification of how she will have to slow down and modify her behavior around her horses (no grooming/riding/handling at this time), no driving, etc. with Supervision A question cues. Pt was left sitting at the edge of the bed with her ex-husband coming in to visit. Continue per current plan of care.   Pain Pain Assessment Pain Scale: Faces Pain Score: 8  Faces Pain Scale: Hurts little more Pain Type: Acute pain Pain Location: Chest Pain Orientation: Mid Pain Descriptors / Indicators: Sharp Pain Onset: Other (Comment)(when she coughed) Pain Intervention(s): Distraction;Emotional support Multiple Pain Sites: No  Therapy/Group: Individual Therapy  [name] 04/05/2019, 3:16 PM

## 2019-04-05 NOTE — Plan of Care (Signed)
  Problem: Consults Goal: RH STROKE PATIENT EDUCATION Description: See Patient Education module for education specifics  Outcome: Progressing   Problem: Consults Goal: Diabetes Guidelines if Diabetic/Glucose > 140 Description: If diabetic or lab glucose is > 140 mg/dl - Initiate Diabetes/Hyperglycemia Guidelines & Document Interventions  Outcome: Progressing   Problem: RH BOWEL ELIMINATION Goal: RH STG MANAGE BOWEL WITH ASSISTANCE Description: STG Manage Bowel with mod I Assistance. Outcome: Progressing Goal: RH STG MANAGE BOWEL W/MEDICATION W/ASSISTANCE Description: STG Manage Bowel with Medication with mod I Assistance. Outcome: Progressing   Problem: RH SAFETY Goal: RH STG ADHERE TO SAFETY PRECAUTIONS W/ASSISTANCE/DEVICE Description: STG Adhere to Safety Precautions With cues/reminders Assistance/Device. Outcome: Progressing   Problem: RH PAIN MANAGEMENT Goal: RH STG PAIN MANAGED AT OR BELOW PT'S PAIN GOAL Description: Less than 4 on 0-10 scale  Outcome: Progressing   Problem: RH KNOWLEDGE DEFICIT Goal: RH STG INCREASE KNOWLEDGE OF DIABETES Description: Pt/family will be able to verbalize management of DM with diet and medication regimen prior to DC using handouts/education teaching.   Outcome: Progressing Goal: RH STG INCREASE KNOWLEDGE OF HYPERTENSION Description: Pt/family will be able to verbalize management of HTN with diet and medication regimen prior to DC using handouts/education teaching.   Outcome: Progressing Goal: RH STG INCREASE KNOWLEGDE OF HYPERLIPIDEMIA Description: Pt/family will be able to verbalize management of HDL with diet and medication regimen prior to DC using handouts/education teaching.   Outcome: Progressing Goal: RH STG INCREASE KNOWLEDGE OF STROKE PROPHYLAXIS Description: Pt/family will be able to verbalize signs of stroke and ways to prevent reoccurrence with diet and medication regimen prior to DC  Outcome: Progressing

## 2019-04-05 NOTE — Progress Notes (Signed)
Occupational Therapy Session Note  Patient Details  Name: Joan Webb MRN: [id number] Date of Birth: [birth year]  Today's Date: 04/05/2019 OT Individual Time: 9518-8416 OT Individual Time Calculation (min): 42 min    Short Term Goals: Week 1:  OT Short Term Goal 1 (Week 1): Pt will be able to step over tub ledge to shower chair with supervision. OT Short Term Goal 2 (Week 1): Pt will don shirt with set up OT Short Term Goal 3 (Week 1): Pt will navigate around room with supervision to obtain supplies for ADLs  Skilled Therapeutic Interventions/Progress Updates:    Pt received upright exiting bathroom with nurse tech and MD in room.  Pt notes swelling and pain in LUE - MD orders to not complete any ROM in LUE.  Pt tearful during session due to minimal sleep overnight and frustration with RUE weakness and now pain/swelling in LUE.  Pt ambulated to sink without AD with supervision.  Engaged in oral care in standing with increased time due to RUE weakness and attempts to incorporate RUE into self-care tasks.  Educated on [employer] for RUE as pt with tendency to not utilize RUE during self-care tasks.  Engaged in reaching activity with RUE with focus on elbow flexion/extension and gross grasp.  Pt demonstrating difficulty with grasp and easily frustrated.  Max encouragement throughout tasks.  Therapy Documentation Precautions:  Precautions Precautions: Sternal, Fall, Other (comment) Precaution Booklet Issued: No Precaution Comments: R-side hemiparesis Restrictions Weight Bearing Restrictions: No Other Position/Activity Restrictions: sternal precautions General:   Vital Signs: Therapy Vitals Temp: 99.2 F (37.3 C) Pulse Rate: 86 Resp: 18 BP: 116/68 Patient Position (if appropriate): Sitting Oxygen Therapy SpO2: 100 % O2 Device: Room Air Pain: Pain Assessment Pain Scale: 0-10 Pain Score: 8  Pain Type: Acute pain Pain Location: Chest Pain Descriptors / Indicators: Aching Pain Onset:  Gradual Pain Intervention(s): Medication (See eMAR) ADL:   Vision   Perception    Praxis   Exercises:   Other Treatments:     Therapy/Group: Individual Therapy  Simonne Come 04/05/2019, 3:07 PM

## 2019-04-05 NOTE — Plan of Care (Signed)
Problem: RH Balance Goal: LTG Patient will maintain dynamic standing with ADLs (OT) Description: LTG:  Patient will maintain dynamic standing balance with assist during activities of daily living (OT)  Flowsheets (Taken 04/05/2019 1850) LTG: Pt will maintain dynamic standing balance during ADLs with: (downgraded) Supervision/Verbal cueing Note: Downgraded as pt will require supervision during self-care tasks due to impulsivity and decreased safety awareness   Problem: Sit to Stand Goal: LTG:  Patient will perform sit to stand in prep for activites of daily living with assistance level (OT) Description: LTG:  Patient will perform sit to stand in prep for activites of daily living with assistance level (OT) Flowsheets (Taken 04/05/2019 1850) LTG: PT will perform sit to stand in prep for activites of daily living with assistance level: (downgraded) Supervision/Verbal cueing Note: Downgraded as pt will require supervision during self-care tasks due to impulsivity and decreased safety awareness   Problem: RH Grooming Goal: LTG Patient will perform grooming w/assist,cues/equip (OT) Description: LTG: Patient will perform grooming with assist, with/without cues using equipment (OT) Flowsheets (Taken 04/05/2019 1850) LTG: Pt will perform grooming with assistance level of: Independent with assistive device    Problem: RH Bathing Goal: LTG Patient will bathe all body parts with assist levels (OT) Description: LTG: Patient will bathe all body parts with assist levels (OT) Flowsheets (Taken 04/05/2019 1850) LTG: Pt will perform bathing with assistance level/cueing: (downgraded) Supervision/Verbal cueing Note: Downgraded as pt will require supervision during self-care tasks due to impulsivity and decreased safety awareness   Problem: RH Dressing Goal: LTG Patient will perform upper body dressing (OT) Description: LTG Patient will perform upper body dressing with assist, with/without cues  (OT). Flowsheets (Taken 04/05/2019 1850) LTG: Pt will perform upper body dressing with assistance level of: (downgraded) Supervision/Verbal cueing Note: Downgraded as pt will require supervision during self-care tasks due to impulsivity and decreased safety awareness   Problem: RH Dressing Goal: LTG Patient will perform lower body dressing w/assist (OT) Description: LTG: Patient will perform lower body dressing with assist, with/without cues in positioning using equipment (OT) Flowsheets (Taken 04/05/2019 1850) LTG: Pt will perform lower body dressing with assistance level of: (downgraded) Supervision/Verbal cueing Note: Downgraded as pt will require supervision during self-care tasks due to impulsivity and decreased safety awareness   Problem: RH Toileting Goal: LTG Patient will perform toileting task (3/3 steps) with assistance level (OT) Description: LTG: Patient will perform toileting task (3/3 steps) with assistance level (OT)  Flowsheets (Taken 04/05/2019 1850) LTG: Pt will perform toileting task (3/3 steps) with assistance level: (downgraded) Supervision/Verbal cueing Note: Downgraded as pt will require supervision during self-care tasks due to impulsivity and decreased safety awareness   Problem: RH Toileting Goal: LTG Patient will perform toileting task (3/3 steps) with assistance level (OT) Description: LTG: Patient will perform toileting task (3/3 steps) with assistance level (OT)  Flowsheets (Taken 04/05/2019 1850) LTG: Pt will perform toileting task (3/3 steps) with assistance level: (downgraded) Supervision/Verbal cueing Note: Downgraded as pt will require supervision during self-care tasks due to impulsivity and decreased safety awareness   Problem: RH Toilet Transfers Goal: LTG Patient will perform toilet transfers w/assist (OT) Description: LTG: Patient will perform toilet transfers with assist, with/without cues using equipment (OT) Flowsheets (Taken 04/05/2019  1850) LTG: Pt will perform toilet transfers with assistance level of: (downgraded) Supervision/Verbal cueing Note: Downgraded as pt will require supervision during self-care tasks due to impulsivity and decreased safety awareness   Problem: RH Tub/Shower Transfers Goal: LTG Patient will perform tub/shower transfers w/assist (OT)  Description: LTG: Patient will perform tub/shower transfers with assist, with/without cues using equipment (OT) Flowsheets (Taken 04/05/2019 1850) LTG: Pt will perform tub/shower stall transfers with assistance level of: (downgraded) Supervision/Verbal cueing Note: Downgraded as pt will require supervision during self-care tasks due to impulsivity and decreased safety awareness

## 2019-04-06 ENCOUNTER — Inpatient Hospital Stay (HOSPITAL_COMMUNITY): Payer: Self-pay | Admitting: Speech Pathology

## 2019-04-06 ENCOUNTER — Inpatient Hospital Stay (HOSPITAL_COMMUNITY): Payer: Self-pay | Admitting: Occupational Therapy

## 2019-04-06 ENCOUNTER — Inpatient Hospital Stay (HOSPITAL_COMMUNITY): Payer: Self-pay

## 2019-04-06 DIAGNOSIS — I82A12 Acute embolism and thrombosis of left axillary vein: Secondary | ICD-10-CM

## 2019-04-06 LAB — CREATININE, SERUM
Creatinine, Ser: 0.59 mg/dL (ref 0.44–1.00)
GFR calc Af Amer: >60 mL/min (ref >60)
GFR calc non Af Amer: >60 mL/min (ref >60)

## 2019-04-06 LAB — GLUCOSE, CAPILLARY
Glucose-Capillary: 181 mg/dL — ABNORMAL HIGH (ref 70–99)
Glucose-Capillary: 188 mg/dL — ABNORMAL HIGH (ref 70–99)
Glucose-Capillary: 173 mg/dL — ABNORMAL HIGH (ref 70–99)
Glucose-Capillary: 156 mg/dL — ABNORMAL HIGH (ref 70–99)

## 2019-04-06 MED ORDER — APIXABAN 5 MG PO TABS
10.0000 mg | ORAL_TABLET | Freq: Two times a day (BID) | ORAL | Status: DC
  Administered 2019-04-06 – 2019-04-09 (×6): 10 mg via ORAL
  Filled 2019-04-06 (×6): qty 2

## 2019-04-06 MED ORDER — OXYCODONE HCL 5 MG PO TABS
5.0000 mg | ORAL_TABLET | Freq: Four times a day (QID) | ORAL | Status: DC | PRN
  Administered 2019-04-06 – 2019-04-09 (×7): 10 mg via ORAL
  Filled 2019-04-06 (×7): qty 2

## 2019-04-06 MED ORDER — APIXABAN 5 MG PO TABS: 5.0000 mg | ORAL_TABLET | Freq: Two times a day (BID) | ORAL | Status: DC

## 2019-04-06 MED ORDER — APIXABAN 5 MG PO TABS: 10.0000 mg | ORAL_TABLET | Freq: Two times a day (BID) | ORAL | Status: DC

## 2019-04-06 NOTE — Progress Notes (Signed)
Social Work Patient ID: Joan Webb, female   DOB: January 05, 1971, 48 y.o.   MRN: [id number]  Met with pt and ex-husband to discuss team conference goals supervision level and target dsicharge date 11/21. She voices she was doing better until her DVT but will be ok. Ex-husband has eben here daily and participated in therapies with her and knows her level. Will work on discharge needs for Sat.

## 2019-04-06 NOTE — Progress Notes (Signed)
Physical Therapy Session Note  Patient Details  Name: Joan Webb MRN: [id number] Date of Birth: August 19, 1970  Today's Date: 04/06/2019 PT Individual Time: 0915-0955 PT Individual Time Calculation (min): 40 min   Short Term Goals: Week 1:  PT Short Term Goal 1 (Week 1): = LTGs due to ELOS  Skilled Therapeutic Interventions/Progress Updates:   Received pt sitting in recliner, pt agreeable to PT, and stated pain in bilateral arms but did not state number. Pt reported she received pain meds prior to session. Pt ambulated 53ft without AD CGA to bathroom. Pt required min A for LE clothing management and maximal verbal cues to avoid LUE ROM due to DVT. Pt performed hygiene management with supervision. Pt ambulated 21ft to sink CGA without AD. Pt ambulated 252ft x 1 without AD CGA to therapy gym. Upon seated rest break pt reported increased chest pain and bilateral UE arm pain and requested to return to room and put on clothes. Pt ambulated 156ft x1 without AD CGA back to room and sat EOB. Pt and ex-husband educated on LUE ROM precautions and RW safety. RN present and performed family education with injections. Pt handed care over to RN. Concluded session with pt sitting EOB, ex-husband present in room, needs within reach, bed alarm not set.   Therapy Documentation Precautions:  Precautions Precautions: Sternal, Fall, Other (comment) Precaution Booklet Issued: No Precaution Comments: R-side hemiparesis Restrictions Weight Bearing Restrictions: No Other Position/Activity Restrictions: sternal precautions  Therapy/Group: Individual Therapy Alfonse Alpers PT, DPT   04/06/2019, 7:49 AM

## 2019-04-06 NOTE — Progress Notes (Addendum)
  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 Start coumadin, stop plavix, cont asa 81 mg  Objective: Vital signs in last 24 hours: Temp:  [98.1 F (36.7 C)-99.2 F (37.3 C)] 98.1 F (36.7 C) (11/18 0459) Pulse Rate:  [70-86] 76 (11/18 0839) Resp:  [16-18] 16 (11/18 0839) BP: (99-116)/(59-68) 103/65 (11/18 0459) SpO2:  [97 %-100 %] 98 % (11/18 0459) FiO2 (%):  [98 %] 98 % (11/18 0839)  Hemodynamic parameters for last 24 hours:   nsr Intake/Output from previous day: 11/17 0701 - 11/18 0700 In: 480 [P.O.:480] Out: -  Intake/Output this shift: Total I/O In: 120 [P.O.:120] Out: -     Lab Results: No results for input(s): WBC, HGB, HCT, PLT in the last 72 hours. BMET:  Recent Labs    04/04/19 1035 04/06/19 0449  NA 132*  --   K 3.5  --   CL 92*  --   CO2 28  --   GLUCOSE 191*  --   BUN 19  --   CREATININE 0.61 0.59  CALCIUM 9.1  --     PT/INR: No results for input(s): LABPROT, INR in the last 72 hours. ABG    Component Value Date/Time   PHART 7.358 03/26/2019 0431   HCO3 21.5 03/26/2019 0431   TCO2 23 03/26/2019 0431   ACIDBASEDEF 3.0 (H) 03/26/2019 0431   O2SAT 56.8 03/30/2019 0950   CBG (last 3)  Recent Labs    04/05/19 1649 04/05/19 2113 04/06/19 0623  GLUCAP 177* 203* 181*    Assessment/Plan: S/P CABG DVT LUE - ok to start Eliquis, DC plavix    LOS: 7 days    Tharon Aquas Trigt III 04/06/2019

## 2019-04-06 NOTE — Progress Notes (Signed)
Occupational Therapy Session Note  Patient Details  Name: Joan Webb MRN: [id number] Date of Birth: October 22, 1970  Today's Date: 04/06/2019 OT Individual Time: 7026-3785 OT Individual Time Calculation (min): 47 min  and Today's Date: 04/06/2019 OT Missed Time: 20 Minutes Missed Time Reason: Patient fatigue   Short Term Goals: Week 1:  OT Short Term Goal 1 (Week 1): Pt will be able to step over tub ledge to shower chair with supervision. OT Short Term Goal 2 (Week 1): Pt will don shirt with set up OT Short Term Goal 3 (Week 1): Pt will navigate around room with supervision to obtain supplies for ADLs  Skilled Therapeutic Interventions/Progress Updates:    Treatment session with focus on functional mobility in home environment, bathroom transfers, and functional use of RUE during self-care tasks.  Pt received supine in bed reporting having just taken a nap, agreeable to therapy session.  Pt ambulated to ADL apt with RW with supervision.  Pt's ex-husband present for education.  Pt completed tub/shower transfers with shower chair with Min guard when stepping over tub ledge.  Discussed use of tub transfer bench and educated on transfer technique, pt able to complete with supervision.  Both pt and ex-husband report preference for tub transfer bench to increase pt independence and safety - Victor notified.  Pt completed functional mobility in ADL kitchen with RW and supervision.  Educated on energy conservation strategies during meal prep and use of counters and rearranging setup to increase safety when transporting items in kitchen to ensure proper hand placement on RW during mobility.  Pt returned to room and completed toilet transfer and toileting with supervision.  Pt able to pull pants over hips this session with use of RUE.  Discussed community reintegration and energy conservation with community mobility.  Pt reports understanding of recommendations.  Pt requested to return to resting as pt preoccupied  with LUE pain/concern.  Pt left semi-reclined in bed with all needs in reach.  Therapy Documentation Precautions:  Precautions Precautions: Sternal, Fall, Other (comment) Precaution Booklet Issued: No Precaution Comments: R-side hemiparesis Restrictions Weight Bearing Restrictions: No Other Position/Activity Restrictions: sternal precautions General: General OT Amount of Missed Time: 20 Minutes Vital Signs: Therapy Vitals Temp: 98.5 F (36.9 C) Temp Source: Oral Pulse Rate: 74 Resp: 18 BP: 95/61 Patient Position (if appropriate): Lying Oxygen Therapy SpO2: 100 % O2 Device: Room Air Pain: Pain Assessment Pain Scale: 0-10 Pain Score: 8  Pain Type: Acute pain Pain Location: Arm Pain Orientation: Left Pain Frequency: Constant Pain Onset: On-going Pain Intervention(s): Medication (See eMAR)   Therapy/Group: Individual Therapy  Simonne Come 04/06/2019, 3:24 PM

## 2019-04-06 NOTE — Progress Notes (Signed)
Taught patients former spouse to administer lovenox. He gave shot with verbal cues after demonstration.   Educated patient and visitor on need for visitor to wear a mask while visiting.

## 2019-04-06 NOTE — Progress Notes (Signed)
[facility] PHYSICAL MEDICINE & REHABILITATION PROGRESS NOTE  Subjective/Complaints: Patient seen sitting up in her chair this morning working with therapies.  She states she slept overnight. Overnight she was found to have DVT in her LUE.  ROS: + Left arm pain, chest wall pain.  Denies SOB, N/V/D  Objective: Vital Signs: Blood pressure 103/65, pulse 70, temperature 98.1 F (36.7 C), resp. rate 18, height 5\' 2"  (1.575 m), weight 81.1 kg, SpO2 98 %. Vas [place] Upper Extremity Venous Duplex  Result Date: 04/05/2019 UPPER VENOUS STUDY  Indications: Pain, and Swelling Performing Technologist: Joan Webb RDMS, RVT  Examination Guidelines: A complete evaluation includes B-mode imaging, spectral Doppler, color Doppler, and power Doppler as needed of all accessible portions of each vessel. Bilateral testing is considered an integral part of a complete examination. Limited examinations for reoccurring indications may be performed as noted.  Right Findings: +----------+------------+---------+-----------+----------+-------+ RIGHT     CompressiblePhasicitySpontaneousPropertiesSummary +----------+------------+---------+-----------+----------+-------+ Subclavian    Full       Yes       Yes                      +----------+------------+---------+-----------+----------+-------+  Left Findings: +----------+------------+---------+-----------+--------------+----------------+ LEFT      CompressiblePhasicitySpontaneous  Properties      Summary      +----------+------------+---------+-----------+--------------+----------------+ IJV           Full                                                       +----------+------------+---------+-----------+--------------+----------------+ Subclavian  Partial                           softly          Age                                                    echogenic    Indeterminate    +----------+------------+---------+-----------+--------------+----------------+ Axillary      None                                           Acute       +----------+------------+---------+-----------+--------------+----------------+ Brachial      Full                                                       +----------+------------+---------+-----------+--------------+----------------+ Radial        Full                                                       +----------+------------+---------+-----------+--------------+----------------+ Ulnar         Full                                                       +----------+------------+---------+-----------+--------------+----------------+  Cephalic      None                                           Acute       +----------+------------+---------+-----------+--------------+----------------+ Basilic       None                                           Acute       +----------+------------+---------+-----------+--------------+----------------+  Summary:  Right: Findings consistent with acute deep vein thrombosis involving the right subclavian vein and right axillary vein. Findings consistent with acute superficial vein thrombosis involving the right basilic vein and right cephalic vein.  Left: No evidence of thrombosis in the subclavian.  *See table(s) above for measurements and observations.  Diagnosing physician: Joan Ferrari MD Electronically signed by Joan Ferrari MD on 04/05/2019 at 5:02:28 PM.    Final    No results for input(s): WBC, HGB, HCT, PLT in the last 72 hours. Recent Labs    04/04/19 1035 04/06/19 0449  NA 132*  --   K 3.5  --   CL 92*  --   CO2 28  --   GLUCOSE 191*  --   BUN 19  --   CREATININE 0.61 0.59  CALCIUM 9.1  --     Physical Exam: BP 103/65 (BP Location: Right Arm)   Pulse 70   Temp 98.1 F (36.7 C)   Resp 18   Ht 5\' 2"  (1.575 m)   Wt 81.1 kg   SpO2 98%   BMI 32.70  kg/m  Constitutional: No distress . Vital signs reviewed. HENT: Normocephalic.  Atraumatic. Eyes: EOMI. No discharge. Cardiovascular: No JVD. Respiratory: Normal effort.  No stridor. GI: Non-distended. Skin: Midline chest incision C/D/I Psych: Normal mood.  Normal behavior. Musc: Left upper extremity with significant edema and tenderness Neuro: Alert Makes good eye contact.   Follows commands.   Motor: Left upper extremity/left lower extremity: 5/5 proximal distal Right upper extremity: Shoulder abduction elbow flexion/extension 3/5, wrist extension 3/5, handgrip 3+ /5, with apraxia Right lower extremity: 4+/5 proximal to distal, stable  Assessment/Plan: 1. Functional deficits secondary to left cerebral infarct which require 3+ hours per day of interdisciplinary therapy in a comprehensive inpatient rehab setting.  Physiatrist is providing close team supervision and 24 hour management of active medical problems listed below.  Physiatrist and rehab team continue to assess barriers to discharge/monitor patient progress toward functional and medical goals  Care Tool:  Bathing  Bathing activity did not occur: Refused Body parts bathed by patient: Right arm, Abdomen, Chest, Front perineal area, Right upper leg, Left upper leg, Right lower leg, Left lower leg, Face   Body parts bathed by helper: Buttocks     Bathing assist Assist Level: Minimal Assistance - Patient > 75%     Upper Body Dressing/Undressing Upper body dressing Upper body dressing/undressing activity did not occur (including orthotics): (she'd already dressed for the day) What is the patient wearing?: Button up shirt Orthosis activity level: (Pt assisted)  Upper body assist Assist Level: Maximal Assistance - Patient 25 - 49%    Lower Body Dressing/Undressing Lower body dressing      What is the patient wearing?: Pants     Lower body assist Assist for lower  body dressing: Minimal Assistance - Patient > 75%      Toileting Toileting Toileting Activity did not occur Press photographer and hygiene only): Refused  Toileting assist Assist for toileting: Supervision/Verbal cueing     Transfers Chair/bed transfer  Transfers assist     Chair/bed transfer assist level: Supervision/Verbal cueing(RW)     Locomotion Ambulation   Ambulation assist      Assist level: Supervision/Verbal cueing Assistive device: Walker-rolling Max distance: 154ft   Walk 10 feet activity   Assist     Assist level: Supervision/Verbal cueing Assistive device: Walker-rolling   Walk 50 feet activity   Assist Walk 50 feet with 2 turns activity did not occur: Safety/medical concerns(pt fatigue)  Assist level: Supervision/Verbal cueing Assistive device: Walker-rolling    Walk 150 feet activity   Assist Walk 150 feet activity did not occur: Safety/medical concerns(pt fatigue)  Assist level: Supervision/Verbal cueing Assistive device: Walker-rolling    Walk 10 feet on uneven surface  activity   Assist Walk 10 feet on uneven surfaces activity did not occur: Safety/medical concerns(Fatigue per report )   Assist level: Minimal Assistance - Patient > 75%     Wheelchair     Assist Will patient use wheelchair at discharge?: No   Wheelchair activity did not occur: Safety/medical concerns(sternal precautions)         Wheelchair 50 feet with 2 turns activity    Assist            Wheelchair 150 feet activity     Assist            Medical Problem List and Plan: 1.  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  Sternal precautions  Continue CIR, No range of motion to left upper extremity, discussed with therapies  WHO for RUE  Team conference today to discuss current and goals and coordination of care, home and environmental barriers, and  discharge planning with nursing, case manager, and therapies.  2.  Antithrombotics: -DVT/anticoagulation: SCDs.  Acute LUE DVT, U/S personally reviewed, discussed with radiology regarding read.  Started on Lovenox, reached out to CTS, will await recs for transition to oral meds  Cr. WNL on 11/18             -antiplatelet therapy: Aspirin 81 mg daily, Plavix 25 mg daily x3 weeks then aspirin alone 3. Pain Management: Neurontin 300 mg twice daily, oxycodone/Ultram as needed, continues to use for sternal pain, well controlled with medication.  4. Mood: Provide emotional support             -antipsychotic agents: N/A 5. Neuropsych: This patient is not fully capable of making decisions on her own behalf. 6. Skin/Wound Care: Routine skin checks 7. Fluids/Electrolytes/Nutrition: Routine in and outs.   8.  Acute blood loss anemia.    Hemoglobin 9.5 on 11/12, labs ordered for tomorrow  Continue to monitor 9.  Hypertension.  Lopressor 25 mg twice daily, Lasix 40 mg  Daily  Controlled on 11/18  Monitor with increased mobility 10.  Diabetes mellitus with peripheral neuropathy with hyperglycemia.  Hemoglobin A1c 7.9.    Glucotrol 10 mg twice daily  Levemir 10 units twice daily, increase to 13 twice daily on 11/17.    Check blood sugars before meals and at bedtime  Remains elevated on 11/18, need to encourage consistent diet compliance  Consider further adjustments as necessary  Monitor with increased mobility 11.  Tobacco abuse.  Continue NicoDerm patch as well as inhalers as directed.  Provide counseling 12.  Hyperlipidemia.  Lipitor 13.  Obesity.  BMI 32.98.  Dietary follow-up.  Encourage weight loss. 14.  Post stroke cognitive deficit             SLP for higher-level cognition 15.  Slow transit constipation  KUB personally reviewed, unremarkable  Bowel meds increased on 11/12  Additional meds given x1 on 11/13, however patient did not take.  Discussed compliance with bowel meds  Improving  overall 16.  Hyponatremia  Sodium 132 on 11/16, labs ordered for tomorrow  Continue to monitor 17.  Hypoalbuminemia  Supplement initiated on 11/13 18. Sleep disturbance  Melatonin started on 11/15  Improving  LOS: 7 days A FACE TO FACE EVALUATION WAS PERFORMED  Joan Landen Karis JubaAnil Jessamy Torosyan 04/06/2019, 8:16 AM

## 2019-04-06 NOTE — Progress Notes (Signed)
Occupational Therapy Session Note  Patient Details  Name: Joan Webb MRN: [id number] Date of Birth: 06-Nov-1970  Today's Date: 04/06/2019 OT Individual Time: 1107-1130 OT Individual Time Calculation (min): 23 min    Short Term Goals: Week 1:  OT Short Term Goal 1 (Week 1): Pt will be able to step over tub ledge to shower chair with supervision. OT Short Term Goal 2 (Week 1): Pt will don shirt with set up OT Short Term Goal 3 (Week 1): Pt will navigate around room with supervision to obtain supplies for ADLs  Skilled Therapeutic Interventions/Progress Updates:    Treatment session with focus on functional use of RUE during self-care tasks and problem solving recommended DME for home.  Pt received seated EOB reporting pain in chest after ambulation during PT session and reporting that she is not to use either arm during therapy.  Discussed MD order No range of motion to left upper extremity but clear to continue to address mobility in RUE.  Pt declining any mobility or OOB activity.  Discussed home setup and recommendation for seat in shower for energy conservation.  Plan to complete tub/shower transfers in ADL apt during PM session to problem solve best DME for home setup.  Discussed use of RUE with LB dressing, attempted multiple positions to increase success with pulling pants up over hips.  Pt pleased with increased ability.    Therapy Documentation Precautions:  Precautions Precautions: Sternal, Fall, Other (comment) Precaution Booklet Issued: No Precaution Comments: R-side hemiparesis Restrictions Weight Bearing Restrictions: No Other Position/Activity Restrictions: sternal precautions General:   Vital Signs: Therapy Vitals Pulse Rate: 70 Resp: 16 BP: 103/63 Oxygen Therapy O2 Device: Room Air FiO2 (%): 98 % Pain: Pain Assessment Pain Scale: Faces Pain Score: 8  Faces Pain Scale: Hurts a little bit Pain Type: Intractable pain Pain Location: Chest Pain Orientation:  Mid Pain Descriptors / Indicators: Sharp Pain Frequency: Intermittent Pain Onset: With Activity Pain Intervention(s): Emotional support;Repositioned Multiple Pain Sites: No   Therapy/Group: Individual Therapy  Simonne Come 04/06/2019, 12:20 PM

## 2019-04-06 NOTE — Patient Care Conference (Signed)
Inpatient RehabilitationTeam Conference and Plan of Care Update Date: 04/06/2019   Time: 11:40 AM    Patient Name: Joan Webb      Medical Record Number: [id number]  Date of Birth: [birth year] Sex: Female         Room/Bed: 4M05C/4M05C-01 Payor Info: Payor: MEDICAID PENDING / Plan: MEDICAID PENDING / Product Type: *No Product type* /    Admit Date/Time:  03/30/2019  4:29 PM  Primary Diagnosis:  Acute cerebral infarction [facility])  Patient Active Problem List   Diagnosis Date Noted  . Acute deep vein thrombosis (DVT) of axillary vein of left upper extremity (HCC)   . Swelling   . Sleep disturbance   . Uncontrolled type 2 diabetes mellitus with hyperglycemia (HCC)   . Hypoalbuminemia due to protein-calorie malnutrition (HCC)   . Hyponatremia   . Labile blood glucose   . Spasticity   . Constipation   . Subcortical infarction (HCC) 03/30/2019  . Acute cerebral infarction (HCC)   . Acute blood loss anemia   . Class 1 obesity due to excess calories with serious comorbidity and body mass index (BMI) of 32.0 to 32.9 in adult   . Cognitive deficit, post-stroke   . Coronary artery disease involving native heart without angina pectoris   . Diabetic peripheral neuropathy (HCC)   . Essential hypertension   . 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 Acute ST elevation myocardial infarction (STEMI) of inferior wall (HCC) 03/23/2019  . ST elevation myocardial infarction (STEMI) of inferior wall [facility])     Expected Discharge Date: Expected Discharge Date: 04/09/19  Team Members Present: Physician leading conference: Dr. Maryla Morrow Social Worker Present: Dossie Der, LCSW Nurse Present: Regino Schultze, RN Case Manager: Roderic Palau, RN PT Present: Raechel Chute, PT OT Present: Rosalio Loud, OT SLP Present: Suzzette Righter, CF-SLP PPS Coordinator present : Edson Snowball, PT     Current Status/Progress Goal Weekly Team Focus  Bowel/Bladder   Continent of B/B LBM 11/17  remain continent of B/B normal bowel  pattern  assess q shift laxatives prn   Swallow/Nutrition/ Hydration             ADL's   Min A bathing and dressing, Supervision transfers and ambulation  goals were set at Mod I - will downgrade to supervision due to cognition/impulsivity  RUE NMR, ADL retraining, safety awareness, d/c planning   Mobility   bed mobility supervision, transfers with RW supervision, ambulation with RW supervision, stairs CGA  supervision  ambulation, stair navigation, high level balance/motor control, RW safety, LE strength, improved activity tolerance   Communication             Safety/Cognition/ Behavioral Observations  Supervision-Min A  Supervision A  safety awareness, impulsivity, complex problem solving, compensatory memory strategies, education   Pain   pain mid sternal oxy tylenol prn  3/10  assess pain qshift and prn call MD for pain that is not relieved   Skin   midsternal incision skin glue healed stab wounds (chest tube site)  free of infection skin remains without breakdown  assess qshift and prn      *See Care Plan and progress notes for long and short-term goals.     Barriers to Discharge  Current Status/Progress Possible Resolutions Date Resolved   Nursing                  PT  Other (comments)  impulsive              OT  SLP                SW                Discharge Planning/Teaching Needs:  Home with ex-hsuband who is here daily and observes pt in therapies. Working on PCP and will assist with meds at DC      Team Discussion: Diabetes up/down, swelling LUE, DVT, eliquis transition.  RN - lovenox given, ?cost of eliquis, taking oxy q3h for pain.  OT S transfers, min A dressing, downgraded to S due to cognition.  PT S bed/transfers, amb 150' S, 12 stairs with CGa, S walker goals.  SLP S goals, impulsive, needs cues for attention.  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  Sternal precautions Weekly Focus/Goal: Improve mobility, sleep, cognition, ABLA, DM, hyponatremia, LUE DVT     Possible Resolutions to Barriers: Therapies, follow labs, optimize DM meds, sleep improving   Continued Need for Acute Rehabilitation Level of Care: The patient requires daily medical management by a physician with specialized training in physical medicine and rehabilitation for the following reasons: Direction of a multidisciplinary physical rehabilitation program to maximize functional independence : Yes Medical management of patient stability for increased activity during participation in an intensive rehabilitation regime.: Yes Analysis of laboratory values and/or radiology reports with any subsequent need for medication adjustment and/or medical intervention. : Yes   I attest that I was present, lead the team conference, and concur with the assessment and plan of the team.   Retta Diones 04/06/2019, 8:51 PM  Team conference was held via web/ teleconference due to [place] - 19

## 2019-04-06 NOTE — Progress Notes (Addendum)
Transitions of Care Pharmacist Note  Joan Webb is a 48 y.o. female that has been diagnosed with DVT and will be prescribed Eliquis (apixaban) at discharge.   Patient Education: I provided the following education on Eliquis to the patient: How to take the medication Described what the medication is Signs of bleeding Signs/symptoms of VTE and stroke  Answered their questions  Discharge Medications Plan: The patient wants to have their discharge medications filled by the Transitions of Care pharmacy rather than their usual pharmacy.  The discharge orders pharmacy has not been changed to the Transitions of Care pharmacy, as her current discharge pharmacy is set to the [facility] Department. If the patient would like to receive her discharge medications through the [place], please call the [facility] at [phone]  Thank you,   Eddie Candle, PharmD PGY-1 Pharmacy Resident   Please check amion for clinical pharmacist contact number  April 06, 2019

## 2019-04-06 NOTE — Discharge Instructions (Addendum)
Inpatient Rehab Discharge Instructions  Joan Webb Discharge date and time: No discharge date for patient encounter.   Activities/Precautions/ Functional Status: Activity: Sternal precautions Diet: diabetic diet Wound Care: keep wound clean and dry Functional status:  ___ No restrictions     ___ Walk up steps independently ___ 24/7 supervision/assistance   ___ Walk up steps with assistance ___ Intermittent supervision/assistance  ___ Bathe/dress independently ___ Walk with walker     _x__ Bathe/dress with assistance ___ Walk Independently    ___ Shower independently ___ Walk with assistance    ___ Shower with assistance ___ No alcohol     ___ Return to work/school ________  Special Instructions:  No smoking driving or alcohol     COMMUNITY REFERRALS UPON DISCHARGE:    Home Health:   PT & OT    Agency: [facility] Date of last service: 04/09/2019  Medical Equipment/Items Ordered:ROLLING WALKER & TUB BENCH  Agency/Supplier:[facility]   [phone]  [facility] GIVEN FOR PRESCRIPTION COVERAGE AND INFORMATION GIVEN FOR FREE CLINIC IN [place] FOR PT TO PURSUE WHEN HOME HAS APPLIED FOR DISABILITY AND MEDICAID WITH JESSICA Joan Webb  [phone]   STROKE/TIA DISCHARGE INSTRUCTIONS SMOKING Cigarette smoking nearly doubles your risk of having a stroke & is the single most alterable risk factor  If you smoke or have smoked in the last 12 months, you are advised to quit smoking for your health.  Most of the excess cardiovascular risk related to smoking disappears within a year of stopping.  Ask you doctor about anti-smoking medications  [place] Quit Line: 1-800-QUIT NOW  Free Smoking Cessation Classes [phone]  CHOLESTEROL Know your levels; limit fat & cholesterol in your diet  Lipid Panel     Component Value Date/Time   CHOL 112 03/28/2019 0408   TRIG 114 03/28/2019 0408   HDL 21 (L) 03/28/2019 0408   CHOLHDL 5.3 03/28/2019 0408   VLDL  23 03/28/2019 0408   LDLCALC 68 03/28/2019 0408      Many patients benefit from treatment even if their cholesterol is at goal.  Goal: Total Cholesterol (CHOL) less than 160  Goal:  Triglycerides (TRIG) less than 150  Goal:  HDL greater than 40  Goal:  LDL (LDLCALC) less than 100   BLOOD PRESSURE American Stroke Association blood pressure target is less that 120/80 mm/Hg  Your discharge blood pressure is:  BP: 129/67  Monitor your blood pressure  Limit your salt and alcohol intake  Many individuals will require more than one medication for high blood pressure  DIABETES (A1c is a blood sugar average for last 3 months) Goal HGBA1c is under 7% (HBGA1c is blood sugar average for last 3 months)  Diabetes:     Lab Results  Component Value Date   HGBA1C 7.9 (H) 03/28/2019     Your HGBA1c can be lowered with medications, healthy diet, and exercise.  Check your blood sugar as directed by your physician  Call your physician if you experience unexplained or low blood sugars.  PHYSICAL ACTIVITY/REHABILITATION Goal is 30 minutes at least 4 days per week  Activity: Increase activity slowly, Therapies: Physical Therapy: Home Health Return to work:   Activity decreases your risk of heart attack and stroke and makes your heart stronger.  It helps control your weight and blood pressure; helps you relax and can improve your mood.  Participate in a regular exercise program.  Talk with your doctor about the best form of exercise for you (dancing, walking, swimming, cycling).  DIET/WEIGHT Goal is to maintain a healthy weight  Your discharge diet is:  Diet Order            Diet heart healthy/carb modified Room service appropriate? Yes; Fluid consistency: Thin  Diet effective now              liquids Your height is:  Height: 5\' 2"  (157.5 cm) Your current weight is: Weight: 85.8 kg Your Body Mass Index (BMI) is:  BMI (Calculated): 34.59  Following the type of diet specifically  designed for you will help prevent another stroke.  Your goal weight range is:    Your goal Body Mass Index (BMI) is 19-24.  Healthy food habits can help reduce 3 risk factors for stroke:  High cholesterol, hypertension, and excess weight.  RESOURCES Stroke/Support Group:  Call [phone]   STROKE EDUCATION PROVIDED/REVIEWED AND GIVEN TO PATIENT Stroke warning signs and symptoms How to activate emergency medical system (call 911). Medications prescribed at discharge. Need for follow-up after discharge. Personal risk factors for stroke. Pneumonia vaccine given:  Flu vaccine given:  My questions have been answered, the writing is legible, and I understand these instructions.  I will adhere to these goals & educational materials that have been provided to me after my discharge from the hospital.     My questions have been answered and I understand these instructions. I will adhere to these goals and the provided educational materials after my discharge from the hospital.  Patient/Caregiver Signature _______________________________ Date __________  Clinician Signature _______________________________________ Date __________  Please bring this form and your medication list with you to all your follow-up doctor's appointments.   --------------------------- Information on my medicine - ELIQUIS (apixaban)  Why was Eliquis prescribed for you? Eliquis was prescribed to treat blood clots that may have been found in the veins of your legs (deep vein thrombosis) or in your lungs (pulmonary embolism) and to reduce the risk of them occurring again.  What do You need to know about Eliquis ? The starting dose is 10 mg (two 5 mg tablets) taken TWICE daily for the FIRST SEVEN (7) DAYS, then on 04/14/19 the dose is reduced to ONE 5 mg tablet taken TWICE daily.  Eliquis may be taken with or without food.   Try to take the dose about the same time in the morning and in the evening. If you have  difficulty swallowing the tablet whole please discuss with your pharmacist how to take the medication safely.  Take Eliquis exactly as prescribed and DO NOT stop taking Eliquis without talking to the doctor who prescribed the medication.  Stopping may increase your risk of developing a new blood clot.  Refill your prescription before you run out.  After discharge, you should have regular check-up appointments with your healthcare provider that is prescribing your Eliquis.    What do you do if you miss a dose? If a dose of ELIQUIS is not taken at the scheduled time, take it as soon as possible on the same day and twice-daily administration should be resumed. The dose should not be doubled to make up for a missed dose.  Important Safety Information A possible side effect of Eliquis is bleeding. You should call your healthcare provider right away if you experience any of the following: ? Bleeding from an injury or your nose that does not stop. ? Unusual colored urine (red or dark brown) or unusual colored stools (red or black). ? Unusual bruising for unknown reasons. ? A serious  fall or if you hit your head (even if there is no bleeding).  Some medicines may interact with Eliquis and might increase your risk of bleeding or clotting while on Eliquis. To help avoid this, consult your healthcare provider or pharmacist prior to using any new prescription or non-prescription medications, including herbals, vitamins, non-steroidal anti-inflammatory drugs (NSAIDs) and supplements.  This website has more information on Eliquis (apixaban): http://[email or web address].com/eliquis/home

## 2019-04-06 NOTE — Progress Notes (Signed)
ANTICOAGULATION CONSULT NOTE - Initial Consult  Pharmacy Consult for Enoxaparin>> apixaban Indication: Upper Extremity DVT  No Known Allergies  Patient Measurements: Height: 5\' 2"  (157.5 cm) Weight: 178 lb 12.7 oz (81.1 kg) IBW/kg (Calculated) : 50.1   Vital Signs: Temp: 98.1 F (36.7 C) (11/18 0459) BP: 103/63 (11/18 0847) Pulse Rate: 70 (11/18 0847)  Labs: Recent Labs    04/04/19 1035 04/06/19 0449  CREATININE 0.61 0.59    Estimated Creatinine Clearance: 83.9 mL/min (by C-G formula based on SCr of 0.59 mg/dL).   Medical History: Past Medical History:  Diagnosis Date  . Anxiety   . Coronary artery disease   . Diabetes ([place])   . Hyperlipidemia   . Myocardial infarction ([place])   . Tobacco abuse     Assessment: 48 yr old female with new upper extremity DVT on lovenox to start apixaban. -Last dose of lovenox was 11/18 at ~ 10am  She is also noted with STEMI on 03/23/19 and s/p CABG 03/25/19 with perioperative CVA.  Neurology,  recommended anticoagulation for treating LUE DVT plus aspirin (and stopping clopidogrel).   Goal of Therapy:  Monitor platelets by anticoagulation protocol: Yes   Plan:  -Discontinue lovenox -apixaban 10mg  po bid for 7 days then 5mg  po bid -CBC in am  Hildred Laser, PharmD Clinical Pharmacist **Pharmacist phone directory can now be found on [email or web address] ([email or web address]).  Listed under [place].

## 2019-04-06 NOTE — Progress Notes (Signed)
Speech Language Pathology Daily Session Note  Patient Details  Name: Joan Webb MRN: [id number] Date of Birth: 03/27/1971  Today's Date: 04/06/2019 SLP Individual Time: 0730-0829 SLP Individual Time Calculation (min): 59 min  Short Term Goals: Week 1: SLP Short Term Goal 1 (Week 1): STG=LTG due to ELOS  Skilled Therapeutic Interventions: Pt was seen for skilled ST targeting cognitive goals. Pt was received with NT in restroom. Pt successfully voided and was Mod I in her ability to transfer from sitting to standing and ambulate from the restroom back to recliner. Supervision A verbal cues are still required for safety awareness and reduce impulsivity. Pt was slightly more distractible today in comparison to previous sessions, believed due to internal distraction regarding DVT; Min A verbal cues provided for redirection to tasks and topic maitenance. SLP further facilitated session with Min a verbal cue for organization and problem solving during a complex deductive reasoning puzzle (kitchen shelves). Pt was left sitting in recliner with seat alarm in place and all needs within reach. Continue per current plan of care.      Pain Pain Assessment Pain Scale: Faces Faces Pain Scale: Hurts a little bit Pain Location: Chest Pain Orientation: Mid Pain Descriptors / Indicators: Sharp Pain Frequency: Intermittent Pain Onset: With Activity Pain Intervention(s): Emotional support;Repositioned Multiple Pain Sites: No  Therapy/Group: Individual Therapy  [name] 04/06/2019, 9:43 AM

## 2019-04-07 ENCOUNTER — Inpatient Hospital Stay (HOSPITAL_COMMUNITY): Payer: Self-pay

## 2019-04-07 ENCOUNTER — Inpatient Hospital Stay (HOSPITAL_COMMUNITY): Payer: Self-pay | Admitting: Occupational Therapy

## 2019-04-07 ENCOUNTER — Inpatient Hospital Stay (HOSPITAL_COMMUNITY): Payer: Self-pay | Admitting: Speech Pathology

## 2019-04-07 DIAGNOSIS — D696 Thrombocytopenia, unspecified: Secondary | ICD-10-CM

## 2019-04-07 LAB — CBC WITH DIFFERENTIAL/PLATELET
Abs Immature Granulocytes: 0.08 10*3/uL — ABNORMAL HIGH (ref 0.00–0.07)
Basophils Absolute: 0.1 10*3/uL (ref 0.0–0.1)
Basophils Relative: 1 %
Eosinophils Absolute: 0.1 10*3/uL (ref 0.0–0.5)
Eosinophils Relative: 2 %
HCT: 29.3 % — ABNORMAL LOW (ref 36.0–46.0)
Hemoglobin: 9.5 g/dL — ABNORMAL LOW (ref 12.0–15.0)
Immature Granulocytes: 1 %
Lymphocytes Relative: 18 %
Lymphs Abs: 1.5 10*3/uL (ref 0.7–4.0)
MCH: 28.3 pg (ref 26.0–34.0)
MCHC: 32.4 g/dL (ref 30.0–36.0)
MCV: 87.2 fL (ref 80.0–100.0)
Monocytes Absolute: 0.6 10*3/uL (ref 0.1–1.0)
Monocytes Relative: 7 %
Neutro Abs: 6.1 10*3/uL (ref 1.7–7.7)
Neutrophils Relative %: 71 %
Platelets: 138 10*3/uL — ABNORMAL LOW (ref 150–400)
RBC: 3.36 MIL/uL — ABNORMAL LOW (ref 3.87–5.11)
RDW: 13.9 % (ref 11.5–15.5)
WBC: 8.4 10*3/uL (ref 4.0–10.5)
nRBC: 0 % (ref 0.0–0.2)

## 2019-04-07 LAB — BASIC METABOLIC PANEL
Anion gap: 12 (ref 5–15)
BUN: 16 mg/dL (ref 6–20)
CO2: 26 mmol/L (ref 22–32)
Calcium: 9 mg/dL (ref 8.9–10.3)
Chloride: 96 mmol/L — ABNORMAL LOW (ref 98–111)
Creatinine, Ser: 0.61 mg/dL (ref 0.44–1.00)
GFR calc Af Amer: >60 mL/min (ref >60)
GFR calc non Af Amer: >60 mL/min (ref >60)
Glucose, Bld: 164 mg/dL — ABNORMAL HIGH (ref 70–99)
Potassium: 4.1 mmol/L (ref 3.5–5.1)
Sodium: 134 mmol/L — ABNORMAL LOW (ref 135–145)

## 2019-04-07 LAB — GLUCOSE, CAPILLARY
Glucose-Capillary: 114 mg/dL — ABNORMAL HIGH (ref 70–99)
Glucose-Capillary: 258 mg/dL — ABNORMAL HIGH (ref 70–99)
Glucose-Capillary: 191 mg/dL — ABNORMAL HIGH (ref 70–99)
Glucose-Capillary: 191 mg/dL — ABNORMAL HIGH (ref 70–99)

## 2019-04-07 NOTE — Progress Notes (Signed)
[facility] PHYSICAL MEDICINE & REHABILITATION PROGRESS NOTE  Subjective/Complaints: Patient seen sitting up at the edge of her bed working with therapy this morning.  She states she slept well overnight.  She notes she had some pain in the left arm with edema overnight.  Discussed elevating extremity.  She states she would like to go out with her ex-husband today.  ROS: + Left arm pain, persistent.  Denies SOB, N/V/D  Objective: Vital Signs: Blood pressure 133/73, pulse 80, temperature 98.2 F (36.8 C), temperature source Oral, resp. rate 18, height 5\' 2"  (1.575 m), weight 81.1 kg, SpO2 97 %. Vas [place] Upper Extremity Venous Duplex  Result Date: 04/06/2019 UPPER VENOUS STUDY  Indications: Pain, and Swelling Performing Technologist: Antonieta Pert RDMS, RVT  Examination Guidelines: A complete evaluation includes B-mode imaging, spectral Doppler, color Doppler, and power Doppler as needed of all accessible portions of each vessel. Bilateral testing is considered an integral part of a complete examination. Limited examinations for reoccurring indications may be performed as noted.  Right Findings: +----------+------------+---------+-----------+----------+-------+ RIGHT     CompressiblePhasicitySpontaneousPropertiesSummary +----------+------------+---------+-----------+----------+-------+ Subclavian    Full       Yes       Yes                      +----------+------------+---------+-----------+----------+-------+  Left Findings: +----------+------------+---------+-----------+--------------+----------------+ LEFT      CompressiblePhasicitySpontaneous  Properties      Summary      +----------+------------+---------+-----------+--------------+----------------+ IJV           Full                                                       +----------+------------+---------+-----------+--------------+----------------+ Subclavian  Partial                           softly          Age                                                     echogenic    Indeterminate   +----------+------------+---------+-----------+--------------+----------------+ Axillary      None                                           Acute       +----------+------------+---------+-----------+--------------+----------------+ Brachial      Full                                           Acute       +----------+------------+---------+-----------+--------------+----------------+ Radial        Full                                                       +----------+------------+---------+-----------+--------------+----------------+ Ulnar  Full                                                       +----------+------------+---------+-----------+--------------+----------------+ Cephalic      None                                           Acute       +----------+------------+---------+-----------+--------------+----------------+ Basilic       None                                           Acute       +----------+------------+---------+-----------+--------------+----------------+  Summary:  Right: No evidence of thrombosis in the subclavian.  Left: Findings consistent with acute deep vein thrombosis involving the left subclavian vein and left axillary vein. Findings consistent with acute superficial vein thrombosis involving the left basilic vein and left cephalic vein.  *See table(s) above for measurements and observations.  Diagnosing physician: Waverly Ferrari MD Electronically signed by Waverly Ferrari MD on 04/05/2019 at 5:02:28 PM.    Final (Updating)    Recent Labs    04/07/19 0717  WBC 8.4  HGB 9.5*  HCT 29.3*  PLT 138*   Recent Labs    04/04/19 1035 04/06/19 0449 04/07/19 0717  NA 132*  --  134*  K 3.5  --  4.1  CL 92*  --  96*  CO2 28  --  26  GLUCOSE 191*  --  164*  BUN 19  --  16  CREATININE 0.61 0.59 0.61  CALCIUM 9.1  --  9.0    Physical  Exam: BP 133/73 (BP Location: Right Leg)   Pulse 80   Temp 98.2 F (36.8 C) (Oral)   Resp 18   Ht 5\' 2"  (1.575 m)   Wt 81.1 kg   SpO2 97%   BMI 32.70 kg/m  Constitutional: No distress . Vital signs reviewed. HENT: Normocephalic.  Atraumatic. Eyes: EOMI. No discharge. Cardiovascular: No JVD. Respiratory: Normal effort.  No stridor. GI: Non-distended. Skin: Midline chest incision C/D/I Psych: Normal mood.  Normal behavior. Musc: Left upper extremity with persistent significant edema and tenderness Neuro: Alert Makes good eye contact.   Follows commands.   Motor:  Left lower extremity: 5/5 proximal distal Right upper extremity: Shoulder abduction elbow flexion/extension 4/5, wrist extension 3/5, handgrip 3+ /5, with apraxia Right lower extremity: 4+/5 proximal to distal, unchanged  Assessment/Plan: 1. Functional deficits secondary to left cerebral infarct which require 3+ hours per day of interdisciplinary therapy in a comprehensive inpatient rehab setting.  Physiatrist is providing close team supervision and 24 hour management of active medical problems listed below.  Physiatrist and rehab team continue to assess barriers to discharge/monitor patient progress toward functional and medical goals  Care Tool:  Bathing  Bathing activity did not occur: Refused Body parts bathed by patient: Right arm, Abdomen, Chest, Front perineal area, Right upper leg, Left upper leg, Right lower leg, Left lower leg, Face   Body parts bathed by helper: Buttocks     Bathing assist Assist Level: Minimal Assistance - Patient > 75%     Upper  Body Dressing/Undressing Upper body dressing Upper body dressing/undressing activity did not occur (including orthotics): (she'd already dressed for the day) What is the patient wearing?: Button up shirt Orthosis activity level: (Pt assisted)  Upper body assist Assist Level: Maximal Assistance - Patient 25 - 49%    Lower Body Dressing/Undressing Lower  body dressing      What is the patient wearing?: Pants     Lower body assist Assist for lower body dressing: Minimal Assistance - Patient > 75%     Toileting Toileting Toileting Activity did not occur Press photographer and hygiene only): Refused  Toileting assist Assist for toileting: Minimal Assistance - Patient > 75%     Transfers Chair/bed transfer  Transfers assist     Chair/bed transfer assist level: Supervision/Verbal cueing     Locomotion Ambulation   Ambulation assist      Assist level: Contact Guard/Touching assist Assistive device: No Device Max distance: 273ft   Walk 10 feet activity   Assist     Assist level: Contact Guard/Touching assist Assistive device: No Device   Walk 50 feet activity   Assist Walk 50 feet with 2 turns activity did not occur: Safety/medical concerns(pt fatigue)  Assist level: Contact Guard/Touching assist Assistive device: No Device    Walk 150 feet activity   Assist Walk 150 feet activity did not occur: Safety/medical concerns(pt fatigue)  Assist level: Contact Guard/Touching assist Assistive device: No Device    Walk 10 feet on uneven surface  activity   Assist Walk 10 feet on uneven surfaces activity did not occur: Safety/medical concerns(Fatigue per report )   Assist level: Minimal Assistance - Patient > 75%     Wheelchair     Assist Will patient use wheelchair at discharge?: No   Wheelchair activity did not occur: Safety/medical concerns(sternal precautions)         Wheelchair 50 feet with 2 turns activity    Assist            Wheelchair 150 feet activity     Assist            Medical Problem List and Plan: 1.  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  Sternal precautions  Continue CIR  WHO for RUE 2.   Antithrombotics: -DVT/anticoagulation: SCDs.  Acute LUE DVT, U/S personally reviewed, discussed with radiology regarding read.  Discussed with CTS, Eliquis initiated.  Cr. WNL on 11/19             -antiplatelet therapy: Aspirin 81 mg daily, Plavix 25 mg daily x3 weeks then aspirin alone 3. Pain Management: Neurontin 300 mg twice daily, oxycodone/Ultram as needed, continues to use for sternal pain, well controlled with medication.  4. Mood: Provide emotional support             -antipsychotic agents: N/A 5. Neuropsych: This patient is not fully capable of making decisions on her own behalf. 6. Skin/Wound Care: Routine skin checks 7. Fluids/Electrolytes/Nutrition: Routine in and outs.   8.  Acute blood loss anemia.    Hemoglobin 9.5 on 11/19  Continue to monitor 9.  Hypertension.  Lopressor 25 mg twice daily, Lasix 40 mg  Daily  Controlled on 11/19  Monitor with increased mobility 10.  Diabetes mellitus with peripheral neuropathy with hyperglycemia.  Hemoglobin A1c 7.9.    Glucotrol 10 mg twice daily  Levemir 10 units twice daily, increase to 13 twice daily  on 11/17.    Check blood sugars before meals and at bedtime  Labile on 11/19, need to encourage consistent diet compliance  Consider further adjustments as necessary  Monitor with increased mobility 11.  Tobacco abuse.  Continue NicoDerm patch as well as inhalers as directed.  Provide counseling 12.  Hyperlipidemia.  Lipitor 13.  Obesity.  BMI 32.98.  Dietary follow-up.  Encourage weight loss. 14.  Post stroke cognitive deficit             SLP for higher-level cognition 15.  Slow transit constipation  KUB personally reviewed, unremarkable  Bowel meds increased on 11/12  Additional meds given x1 on 11/13, however patient did not take.  Discussed compliance with bowel meds  Improving overall 16.  Hyponatremia  Sodium 134 on 11/19  Continue to monitor 17.  Hypoalbuminemia  Supplement initiated on 11/13 18. Sleep  disturbance  Melatonin started on 11/15  Improving 19.  Thrombocytopenia  Platelets 138 on 11/19  Significant decrease since 11/12, labs ordered for tomorrow  LOS: 8 days A FACE TO FACE EVALUATION WAS PERFORMED  Ankit Karis JubaAnil Patel 04/07/2019, 8:14 AM

## 2019-04-07 NOTE — Progress Notes (Signed)
ANTICOAGULATION CONSULT NOTE - Follow Up Consult  Pharmacy Consult for Eliquis Indication: DVT  No Known Allergies  Patient Measurements: Height: 5\' 2"  (157.5 cm) Weight: 178 lb 12.7 oz (81.1 kg) IBW/kg (Calculated) : 50.1  Vital Signs: Temp: 98.2 F (36.8 C) (11/19 0356) Temp Source: Oral (11/19 0356) BP: 133/73 (11/19 0356) Pulse Rate: 80 (11/19 0826)  Labs: Recent Labs    04/06/19 0449 04/07/19 0717  HGB  --  9.5*  HCT  --  29.3*  PLT  --  138*  CREATININE 0.59 0.61    Estimated Creatinine Clearance: 83.9 mL/min (by C-G formula based on SCr of 0.61 mg/dL).  Assessment: Anticoag:  upper extremity DVT; pt with small lacunar infarct >65 week old, at low risk of hemorrhagic conversion, per neuro, who recommends anticoagulation plus ASA (not DAPT; Plavix d/c'd). Hgb 9.5 stable. Plts 303> 343>138? (LMWH d/c'd 11/18)  Goal of Therapy:  Therapeutic oral anticoagulation   Plan:  -apixaban 10mg  po bid for 7 days then 5mg  po bid (starts 11/26; counting 11/19 as day 1)  Pharmacy will sign off. Please reconsult for further dosing assitance.   Holten Spano S. Alford Highland, PharmD, BCPS Clinical Staff Pharmacist Eilene Ghazi Stillinger 04/07/2019,11:10 AM

## 2019-04-07 NOTE — Discharge Summary (Signed)
Physician Discharge Summary  Patient ID: Joan Webb MRN: [id number] DOB/AGE: 1970/06/25 48 y.o.  Admit date: 03/30/2019 Discharge date: 04/09/2019  Discharge Diagnoses:  Principal Problem:   Acute cerebral infarction [facility]) Active Problems:   Subcortical infarction (HCC)   Constipation   Hypoalbuminemia due to protein-calorie malnutrition (HCC)   Hyponatremia   Labile blood glucose   Spasticity   Swelling   Sleep disturbance   Uncontrolled type 2 diabetes mellitus with hyperglycemia (HCC)   Acute deep vein thrombosis (DVT) of axillary vein of left upper extremity (HCC)   Thrombocytopenia (HCC)   Discharged Condition: Stable  Significant Diagnostic Studies: Ct Angio Head W Or Wo Contrast  Result Date: 03/27/2019 CLINICAL DATA:  Right-sided weakness. EXAM: CT ANGIOGRAPHY HEAD AND NECK TECHNIQUE: Multidetector CT imaging of the head and neck was performed using the standard protocol during bolus administration of intravenous contrast. Multiplanar CT image reconstructions and MIPs were obtained to evaluate the vascular anatomy. Carotid stenosis measurements (when applicable) are obtained utilizing NASCET criteria, using the distal internal carotid diameter as the denominator. CONTRAST:  75mL OMNIPAQUE IOHEXOL 350 MG/ML SOLN COMPARISON:  No pertinent prior studies available for comparison. FINDINGS: CT HEAD FINDINGS Brain: No evidence of acute intracranial hemorrhage. There is a small focus of hypodensity within the left centrum semiovale/corona radiata suspicious for acute/subacute infarct. No cortical infarct is identified. No evidence intracranial mass. No midline shift or extra-axial fluid collection. Cerebral volume is normal for age. Vascular: Reported separately Skull: Normal. Negative for fracture or focal lesion. Sinuses: No significant paranasal sinus disease or mastoid effusion. Orbits: Visualized orbits demonstrate no acute abnormality. Review of the MIP images confirms the above  findings CTA NECK FINDINGS Aortic arch: Standard branching. No significant plaque within the visualized aortic arch or proximal major branch vessels of the neck. Right carotid system: CCA and ICA patent without measurable stenosis. Mild noncalcified plaque within the carotid bifurcation Left carotid system: CCA and ICA patent within the neck without measurable stenosis. No significant atherosclerotic disease. Vertebral arteries: Left vertebral artery dominant. The vertebral arteries are patent within the neck bilaterally without significant stenosis (50% or greater. No significant atherosclerotic disease. Skeleton: Sequela of recent median sternotomy. Otherwise, no acute bony abnormality Other neck: No soft tissue neck mass or pathologically enlarged cervical chain lymph nodes. Subcentimeter calcified nodule within the left thyroid lobe not meeting consensus criteria for ultrasound follow-up. Upper chest: Sequela of recent median sternotomy. Patchy airspace opacities within the imaged lung apices. Partially imaged bilateral pleural effusions. Partially visualized right IJ approach central venous catheter Review of the MIP images confirms the above findings CTA HEAD FINDINGS Anterior circulation: Minimal calcified plaque within the intracranial internal carotid arteries without stenosis. Motion degradation somewhat limits evaluation of the proximal middle cerebral arteries. The bilateral middle and anterior cerebral arteries are patent without significant proximal stenosis identified. No intracranial aneurysm is identified. Posterior circulation: The non dominant intracranial right vertebral artery is diminutive beyond the origin of the right PICA. The bilateral intracranial vertebral arteries are patent without significant stenosis. The basilar artery is patent without significant stenosis. The bilateral posterior cerebral arteries are patent without significant proximal stenosis. Large bilateral posterior  communicating arteries are present. Venous sinuses: Within limitations of contrast timing, no convincing thrombus. Anatomic variants: None significant Review of the MIP images confirms the above findings These results were called by telephone at the time of interpretation on 03/27/2019 at 11:56 am to provider [facility] , who verbally acknowledged these results. IMPRESSION: CT head: Small focus  of hypodensity within the left centrum semiovale/corona radiata suspicious for acute/subacute infarct. CTA neck: 1. The bilateral common carotid, internal carotid and vertebral arteries are patent within the neck without significant stenosis. 2. Sequela of recent median sternotomy. Patchy airspace disease within the bilateral lung apices may reflect postoperative edema. Infection not excluded. Partially imaged bilateral pleural effusions. CTA head: 1. Motion degraded examination which somewhat limits evaluation of the proximal middle cerebral arteries. 2. No intracranial large vessel occlusion or identified high-grade proximal arterial stenosis. Electronically Signed   By: Jackey LogeKyle  Golden DO   On: 03/27/2019 12:12   Dg Abd 1 View  Result Date: 03/31/2019 CLINICAL DATA:  No bowel movement for 6 days. EXAM: ABDOMEN - 1 VIEW COMPARISON:  Radiograph dated 03/25/2019 FINDINGS: The bowel gas pattern is normal. There is stool scattered throughout the colon but there is no evidence of fecal impaction. Bones are normal. No abnormal abdominal calcifications. IMPRESSION: Benign-appearing abdomen. Electronically Signed   By: Francene BoyersJames  Maxwell M.D.   On: 03/31/2019 09:27   Ct Angio Neck W Or Wo Contrast  Result Date: 03/27/2019 CLINICAL DATA:  Right-sided weakness. EXAM: CT ANGIOGRAPHY HEAD AND NECK TECHNIQUE: Multidetector CT imaging of the head and neck was performed using the standard protocol during bolus administration of intravenous contrast. Multiplanar CT image reconstructions and MIPs were obtained to evaluate the vascular  anatomy. Carotid stenosis measurements (when applicable) are obtained utilizing NASCET criteria, using the distal internal carotid diameter as the denominator. CONTRAST:  75mL OMNIPAQUE IOHEXOL 350 MG/ML SOLN COMPARISON:  No pertinent prior studies available for comparison. FINDINGS: CT HEAD FINDINGS Brain: No evidence of acute intracranial hemorrhage. There is a small focus of hypodensity within the left centrum semiovale/corona radiata suspicious for acute/subacute infarct. No cortical infarct is identified. No evidence intracranial mass. No midline shift or extra-axial fluid collection. Cerebral volume is normal for age. Vascular: Reported separately Skull: Normal. Negative for fracture or focal lesion. Sinuses: No significant paranasal sinus disease or mastoid effusion. Orbits: Visualized orbits demonstrate no acute abnormality. Review of the MIP images confirms the above findings CTA NECK FINDINGS Aortic arch: Standard branching. No significant plaque within the visualized aortic arch or proximal major branch vessels of the neck. Right carotid system: CCA and ICA patent without measurable stenosis. Mild noncalcified plaque within the carotid bifurcation Left carotid system: CCA and ICA patent within the neck without measurable stenosis. No significant atherosclerotic disease. Vertebral arteries: Left vertebral artery dominant. The vertebral arteries are patent within the neck bilaterally without significant stenosis (50% or greater. No significant atherosclerotic disease. Skeleton: Sequela of recent median sternotomy. Otherwise, no acute bony abnormality Other neck: No soft tissue neck mass or pathologically enlarged cervical chain lymph nodes. Subcentimeter calcified nodule within the left thyroid lobe not meeting consensus criteria for ultrasound follow-up. Upper chest: Sequela of recent median sternotomy. Patchy airspace opacities within the imaged lung apices. Partially imaged bilateral pleural effusions.  Partially visualized right IJ approach central venous catheter Review of the MIP images confirms the above findings CTA HEAD FINDINGS Anterior circulation: Minimal calcified plaque within the intracranial internal carotid arteries without stenosis. Motion degradation somewhat limits evaluation of the proximal middle cerebral arteries. The bilateral middle and anterior cerebral arteries are patent without significant proximal stenosis identified. No intracranial aneurysm is identified. Posterior circulation: The non dominant intracranial right vertebral artery is diminutive beyond the origin of the right PICA. The bilateral intracranial vertebral arteries are patent without significant stenosis. The basilar artery is patent without significant stenosis. The bilateral  posterior cerebral arteries are patent without significant proximal stenosis. Large bilateral posterior communicating arteries are present. Venous sinuses: Within limitations of contrast timing, no convincing thrombus. Anatomic variants: None significant Review of the MIP images confirms the above findings These results were called by telephone at the time of interpretation on 03/27/2019 at 11:56 am to provider [facility] , who verbally acknowledged these results. IMPRESSION: CT head: Small focus of hypodensity within the left centrum semiovale/corona radiata suspicious for acute/subacute infarct. CTA neck: 1. The bilateral common carotid, internal carotid and vertebral arteries are patent within the neck without significant stenosis. 2. Sequela of recent median sternotomy. Patchy airspace disease within the bilateral lung apices may reflect postoperative edema. Infection not excluded. Partially imaged bilateral pleural effusions. CTA head: 1. Motion degraded examination which somewhat limits evaluation of the proximal middle cerebral arteries. 2. No intracranial large vessel occlusion or identified high-grade proximal arterial stenosis. Electronically  Signed   By: Jackey Loge DO   On: 03/27/2019 12:12   Dg Chest Port 1 View  Result Date: 03/30/2019 CLINICAL DATA:  Status post coronary bypass graft. EXAM: PORTABLE CHEST 1 VIEW COMPARISON:  March 29, 2019. FINDINGS: Stable cardiomegaly. Sternotomy wires are noted. No pneumothorax is noted. Stable bibasilar and perihilar opacities are noted concerning for edema or atelectasis. Small pleural effusions may be present. Bony thorax is unremarkable. IMPRESSION: Stable bilateral lung opacities as described above. Electronically Signed   By: Lupita Raider M.D.   On: 03/30/2019 08:08   Dg Chest Port 1 View  Result Date: 03/29/2019 CLINICAL DATA:  CABG.  Sore chest. EXAM: PORTABLE CHEST 1 VIEW COMPARISON:  03/27/2019. FINDINGS: Interim removal of right IJ sheath. Left PICC line noted with tip over cavoatrial junction. Epicardial pacer wire noted over the left chest base. Prior CABG. Cardiomegaly. Bilateral pulmonary infiltrates/edema progressed from prior exam. Progression of bibasilar atelectasis. Small bilateral pleural effusions cannot be excluded. No pneumothorax. IMPRESSION: 1. Interim removal of right IJ sheath. Left PICC line noted with tip over cavoatrial junction. 2.  Prior CABG.  Cardiomegaly again noted. 3. Progressive bilateral pulmonary infiltrates/edema. Progressive bibasilar atelectasis. Small bilateral pleural effusions cannot be excluded. Electronically Signed   By: Maisie Fus  Register   On: 03/29/2019 06:16   Dg Chest Port 1 View  Result Date: 03/27/2019 CLINICAL DATA:  (coronary artery bypass graft) Update status EXAM: PORTABLE CHEST 1 VIEW COMPARISON:  Chest radiograph 03/26/2019, 03/25/2019 FINDINGS: Stable cardiomediastinal contours status post median sternotomy and CABG. Interval removal of a left chest tube and retraction of the PA catheter. There are increased bilateral diffuse interstitial opacities most likely representing pulmonary edema. There is persistent consolidation at the  left base likely representing atelectasis and small volume pleural fluid. No evidence of pneumothorax. IMPRESSION: 1. Increased bilateral interstitial opacities, most likely representing pulmonary edema. 2. Persistent consolidation at the left base likely a combination of atelectasis and small volume pleural fluid. Electronically Signed   By: Emmaline Kluver M.D.   On: 03/27/2019 13:47   Dg Chest Port 1 View  Result Date: 03/26/2019 CLINICAL DATA:  LEFT HEART CATH AND CORONARY ANGIOGRAPHY on 03/23/19 and CABG x 3 on 03/25/19 EXAM: PORTABLE CHEST 1 VIEW COMPARISON:  Chest radiograph 03/25/2019 FINDINGS: Stable cardiomediastinal contours status post median sternotomy and CABG. Interval extubation. Unchanged position of a right central PA catheter. Left chest tube remains in place. Central venous congestion with be mild perihilar opacities likely mild pulmonary edema. Consolidation at the left base may reflect atelectasis and/or small amount of  pleural fluid. No pneumothorax. IMPRESSION: 1. Central venous congestion with mild perihilar opacities likely mild edema. 2. Consolidation at the left base may reflect atelectasis and/or small amount of pleural fluid. Electronically Signed   By: Emmaline Kluver M.D.   On: 03/26/2019 11:18   Dg Chest Portable 1 View  Result Date: 03/25/2019 CLINICAL DATA:  Evaluate for pneumothorax. EXAM: PORTABLE CHEST 1 VIEW COMPARISON:  Chest radiograph 03/23/2019 FINDINGS: ET tube mid trachea. Mediastinal drain in place. Epicardial pacing wires. Pulmonary arterial catheter projects over the main pulmonary artery. Left chest tube in place. Widening of the mediastinal contours. Patient status post interval median sternotomy and CABG procedure. Low lung volumes. Left mid lower lung consolidative opacities. Bilateral interstitial opacities. No definite pneumothorax visualized. IMPRESSION: No definite pneumothorax visualized on limited portable exam. Support apparatus as above. Patchy  consolidation left mid lower lung may be atelectasis and or postsurgical in etiology. Possible mild interstitial edema. Electronically Signed   By: Annia Belt M.D.   On: 03/25/2019 19:15   Dg Chest Port 1 View  Result Date: 03/23/2019 CLINICAL DATA:  Acute myocardial infarction.  Chest pain. EXAM: PORTABLE CHEST 1 VIEW COMPARISON:  None. FINDINGS: The heart size and mediastinal contours are within normal limits. Both lungs are clear. The visualized skeletal structures are unremarkable. IMPRESSION: Normal exam. Electronically Signed   By: Francene Boyers M.D.   On: 03/23/2019 11:53   Dg Abd Portable 1v  Result Date: 03/25/2019 CLINICAL DATA:  NG tube placement EXAM: PORTABLE ABDOMEN - 1 VIEW COMPARISON:  None. FINDINGS: The tip the NG tube is seen projecting over the mid body of the stomach. There is a left-sided chest tube present. A right-sided PA catheter is noted with the tip the main pulmonary artery. There is overlying median sternotomy wires. IMPRESSION: Tip of NG tube seen projecting over the mid body of the stomach. Electronically Signed   By: Jonna Clark M.D.   On: 03/25/2019 23:16   Vas US Doppler Pre Cabg  Result Date: 03/23/2019 PREOPERATIVE VASCULAR EVALUATION  Indications:      Pre-surgical evaluation. Risk Factors:     Hyperlipidemia, Diabetes. Comparison Study: No prior study Performing Technologist: Gertie Fey MHA, RVT, RDCS, RDMS  Examination Guidelines: A complete evaluation includes B-mode imaging, spectral Doppler, color Doppler, and power Doppler as needed of all accessible portions of each vessel. Bilateral testing is considered an integral part of a complete examination. Limited examinations for reoccurring indications may be performed as noted.  Right Carotid Findings: +----------+--------+--------+--------+----------------------+--------+           PSV cm/sEDV cm/sStenosisDescribe              Comments  +----------+--------+--------+--------+----------------------+--------+ CCA Prox  85      19                                             +----------+--------+--------+--------+----------------------+--------+ CCA Distal56      12              homogeneous and smooth         +----------+--------+--------+--------+----------------------+--------+ ICA Prox  90      23                                             +----------+--------+--------+--------+----------------------+--------+  ICA Distal69      25                                             +----------+--------+--------+--------+----------------------+--------+ ECA       120     14              smooth and homogeneous         +----------+--------+--------+--------+----------------------+--------+ Portions of this table do not appear on this page. +----------+--------+-------+----------------+------------+           PSV cm/sEDV cmsDescribe        Arm Pressure +----------+--------+-------+----------------+------------+ [id number]            Multiphasic, WNL             +----------+--------+-------+----------------+------------+ +---------+--------+--+--------+-+---------+ VertebralPSV cm/s32EDV cm/s5Antegrade +---------+--------+--+--------+-+---------+ Left Carotid Findings: +----------+--------+--------+--------+-----------------------+--------+           PSV cm/sEDV cm/sStenosisDescribe               Comments +----------+--------+--------+--------+-----------------------+--------+ CCA Prox  100     20                                              +----------+--------+--------+--------+-----------------------+--------+ CCA Distal87      20              smooth and homogeneous          +----------+--------+--------+--------+-----------------------+--------+ ICA Prox  73      21                                               +----------+--------+--------+--------+-----------------------+--------+ ICA Distal97      32                                              +----------+--------+--------+--------+-----------------------+--------+ ECA       144     15              smooth and heterogenous         +----------+--------+--------+--------+-----------------------+--------+ +----------+--------+--------+----------------+------------+ SubclavianPSV cm/sEDV cm/sDescribe        Arm Pressure +----------+--------+--------+----------------+------------+           106             Multiphasic, WNL86           +----------+--------+--------+----------------+------------+ +---------+--------+--+--------+--+---------+ VertebralPSV cm/s45EDV cm/s11Antegrade +---------+--------+--+--------+--+---------+  ABI Findings: +--------+------------------+-----+---------+----------------------------------+ Right   Rt Pressure (mmHg)IndexWaveform Comment                            +--------+------------------+-----+---------+----------------------------------+ Brachial                       triphasicUnable to obtain pressure due to                                           TR band                            +--------+------------------+-----+---------+----------------------------------+  PTA     84                0.98 triphasic                                   +--------+------------------+-----+---------+----------------------------------+ DP      92                1.07 triphasic                                   +--------+------------------+-----+---------+----------------------------------+ +--------+------------------+-----+---------+-------+ Left    Lt Pressure (mmHg)IndexWaveform Comment +--------+------------------+-----+---------+-------+ Brachial86                     triphasic        +--------+------------------+-----+---------+-------+ PTA     104               1.21 triphasic         +--------+------------------+-----+---------+-------+ DP      97                1.13 triphasic        +--------+------------------+-----+---------+-------+ +-------+---------------+----------------+ ABI/TBIToday's ABI/TBIPrevious ABI/TBI +-------+---------------+----------------+ Right  1.07                            +-------+---------------+----------------+ Left   1.21                            +-------+---------------+----------------+  Right Doppler Findings: +-----------+--------+-----+---------+----------------------------------------+ Site       PressureIndexDoppler  Comments                                 +-----------+--------+-----+---------+----------------------------------------+ Brachial                triphasicUnable to obtain pressure due to TR band +-----------+--------+-----+---------+----------------------------------------+ Radial                  triphasic                                         +-----------+--------+-----+---------+----------------------------------------+ Ulnar                   triphasic                                         +-----------+--------+-----+---------+----------------------------------------+ Palmar Arch                      Unable to evaluate due to TR band        +-----------+--------+-----+---------+----------------------------------------+  Left Doppler Findings: +-----------+--------+-----+---------+-----------------------------------------+ Site       PressureIndexDoppler  Comments                                  +-----------+--------+-----+---------+-----------------------------------------+ Brachial   86           triphasic                                          +-----------+--------+-----+---------+-----------------------------------------+  Radial                  triphasic                                           +-----------+--------+-----+---------+-----------------------------------------+ Ulnar                   triphasic                                          +-----------+--------+-----+---------+-----------------------------------------+ Palmar Arch                      Signal is unaffected with radial                                           compresion, obliterates with ulnar                                         compression.                              +-----------+--------+-----+---------+-----------------------------------------+  Summary: Right Carotid: Velocities in the right ICA are consistent with a 1-39% stenosis. Left Carotid: Velocities in the left ICA are consistent with a 1-39% stenosis. Vertebrals:  Bilateral vertebral arteries demonstrate antegrade flow. Subclavians: Normal flow hemodynamics were seen in bilateral subclavian              arteries. Right ABI: Resting right ankle-brachial index is within normal range. No evidence of significant right lower extremity arterial disease. Left ABI: Resting left ankle-brachial index is within normal range. No evidence of significant left lower extremity arterial disease. Right Upper Extremity: No significant arterial obstruction detected in the right upper extremity. Left Upper Extremity: No significant arterial obstruction detected in the left upper extremity.  Electronically signed by Gretta Began MD on 03/23/2019 at 6:27:07 PM.    Final    Vas [place] Upper Extremity Venous Duplex  Result Date: 04/06/2019 UPPER VENOUS STUDY  Indications: Pain, and Swelling Performing Technologist: Levada Schilling RDMS, RVT  Examination Guidelines: A complete evaluation includes B-mode imaging, spectral Doppler, color Doppler, and power Doppler as needed of all accessible portions of each vessel. Bilateral testing is considered an integral part of a complete examination. Limited examinations for reoccurring indications may be performed as noted.   Right Findings: +----------+------------+---------+-----------+----------+-------+ RIGHT     CompressiblePhasicitySpontaneousPropertiesSummary +----------+------------+---------+-----------+----------+-------+ Subclavian    Full       Yes       Yes                      +----------+------------+---------+-----------+----------+-------+  Left Findings: +----------+------------+---------+-----------+--------------+----------------+ LEFT      CompressiblePhasicitySpontaneous  Properties      Summary      +----------+------------+---------+-----------+--------------+----------------+ IJV           Full                                                       +----------+------------+---------+-----------+--------------+----------------+  Subclavian  Partial                           softly          Age                                                    echogenic    Indeterminate   +----------+------------+---------+-----------+--------------+----------------+ Axillary      None                                           Acute       +----------+------------+---------+-----------+--------------+----------------+ Brachial      Full                                           Acute       +----------+------------+---------+-----------+--------------+----------------+ Radial        Full                                                       +----------+------------+---------+-----------+--------------+----------------+ Ulnar         Full                                                       +----------+------------+---------+-----------+--------------+----------------+ Cephalic      None                                           Acute       +----------+------------+---------+-----------+--------------+----------------+ Basilic       None                                           Acute        +----------+------------+---------+-----------+--------------+----------------+  Summary:  Right: No evidence of thrombosis in the subclavian.  Left: Findings consistent with acute deep vein thrombosis involving the left subclavian vein and left axillary vein. Findings consistent with acute superficial vein thrombosis involving the left basilic vein and left cephalic vein.  *See table(s) above for measurements and observations.  Diagnosing physician: Waverly Ferrari MD Electronically signed by Waverly Ferrari MD on 04/05/2019 at 5:02:28 PM.    Final (Updating)    [place] Ekg Site Rite  Result Date: 03/28/2019 If Site Rite image not attached, placement could not be confirmed due to current cardiac rhythm.   Labs:  Basic Metabolic Panel: Recent Labs  Lab 04/04/19 1035 04/06/19 0449 04/07/19 0717  NA 132*  --  134*  K 3.5  --  4.1  CL 92*  --  96*  CO2  28  --  26  GLUCOSE 191*  --  164*  BUN 19  --  16  CREATININE 0.61 0.59 0.61  CALCIUM 9.1  --  9.0    CBC: Recent Labs  Lab 04/07/19 0717  WBC 8.4  NEUTROABS 6.1  HGB 9.5*  HCT 29.3*  MCV 87.2  PLT 138*    CBG: Recent Labs  Lab 04/06/19 2128 04/07/19 0628 04/07/19 1146 04/07/19 1629 04/07/19 2103  GLUCAP 156* 114* 258* 191* 191*   Family history.  Father with CAD and myocardial infarction as well as hypertension.  Denies diabetes mellitus or colon cancer  Brief HPI:   Joan Webb is a 48 y.o. right-handed female with history of diabetes mellitus with intermittent numbness in bilateral hands, tobacco abuse and hyperlipidemia as well as obesity with BMI of 32.98.  Patient lives alone independent prior to admission she does have children in the area and an ex-husband.  Presented 03/23/2019 with chest pain.  EKG performed showed inferior STEMI.  Code STEMI was activated.  Noted sodium 131, glucose 338, troponin XX 4, WBC 14,300, SARS Covid negative.  Chest x-ray negative.  She was taken to the Cath Lab demonstrating severe  multivessel coronary artery disease.  Underwent CABG x3 on 03/25/2019 per Dr. Maren Beach.  Hospital course complicated by acute blood loss anemia 8.3.  Sternal precautions as indicated.  On 03/27/2019 patient found to have right arm weakness and numbness.  Cranial CT scan showed small focus of hypodensity within the left centrum semiovale corona radiata.  CTA of the neck bilateral common carotid, internal carotid and vertebral arteries patent within the neck without significant stenosis.  CTA of head with no large vessel occlusion.  MRI was not performed due to postsurgical staples.  Carotid Dopplers 1 to 39% stenosis.  Echocardiogram with ejection fraction 65%.  Epicardial wires removed 03/30/2019 follow-up chest x-ray showed progressive bilateral pulmonary infiltrates and edema progressive bibasilar atelectasis.  Patient remained on Lasix therapy as directed.  Neurology recommended aspirin and Plavix x3 weeks then aspirin alone.  Tolerating a regular diet.  Patient was admitted for a comprehensive rehab program   Hospital Course: Katheren Jimmerson was admitted to rehab 03/30/2019 for inpatient therapies to consist of PT, ST and OT at least three hours five days a week. Past admission physiatrist, therapy team and rehab RN have worked together to provide customized collaborative inpatient rehab.  Pertaining to patient left centrum semiovale corona radiata after cardiac bypass 03/25/2019 sternal precautions as indicated she would follow-up with cardiothoracic surgery.  Surgical site clean and dry.  Patient initially maintained on aspirin Plavix therapy for CVA prophylaxis noted venous Dopplers of left upper extremity showed acute DVT left subclavian vein and left axillary vein.  Findings consistent with acute superficial vein thrombosis involving the left basilic vein and left cephalic vein.  Patient's Plavix was discontinued started on Eliquis she remained on low-dose aspirin after discussing with cardiothoracic surgery.   No bleeding episodes noted.  Pain managed with use of Neurontin 300 mg twice daily as well as oxycodone Ultram as needed for pain.  Acute blood loss anemia stable 9.5.  Monitoring of platelet count 172,000.  Blood pressure control Lopressor as well as Lasix with no signs of fluid overload and lasix latter completed after five days per CVTS.  Diabetes mellitus peripheral neuropathy hemoglobin A1c 7.9 Glucotrol 10 mg twice daily as well as Levemir 13 units twice daily changed to janumet on discharge with full diabetic teaching completed.  Patient did have  a history of tobacco abuse NicoDerm patch as indicated she received counts regards to cessation of nicotine products.  Lipitor ongoing for hyperlipidemia.  Obesity BMI 32.98 dietary follow-up encouraging weight loss.  Bouts of constipation KUB unremarkable bowel program adjusted.   Blood pressures were monitored on TID basis and stable  Diabetes has been monitored with ac/hs CBG checks and SSI was use prn for tighter BS control.   She is continent of bowel and bladder.  Joan Webb has made gains during rehab stay and is attending therapies  Joan Webb will continue to receive follow up therapies   after discharge  Rehab course: During patient's stay in rehab weekly team conferences were held to monitor patient's progress, set goals and discuss barriers to discharge. At admission, patient required minimal assist ambulate 40 feet rolling walker, moderate assist stand pivot transfers, moderate assist side-lying to sitting, minimal assist for rolling.  Max assist upper body dressing max is lower body dressing moderate assist toilet transfers  Physical exam.  Blood pressure 110/58 pulse 74 temperature 98 respirations 13 oxygen saturations 96% room air Constitutional.  Well-developed no distress obese HEENT Eyes.  Pupils round and reactive to light no discharge without nystagmus Neck.  Supple nontender no JVD without thyromegaly Cardiac regular rate rhythm without  murmur extra sounds or rub Respiratory effort normal no respiratory distress GI.  Exhibits no distention nontender positive bowel sounds Neurological alert and oriented left upper left lower extremity 5 out of 5 proximal to distal.  Right upper extremity shoulder abduction elbow flexion extension 4 out of 5 wrist extension handgrip 0 out of 5.  Right lower extremity 4 out of 5 proximal to distal   /She  has had improvement in activity tolerance, balance, postural control as well as ability to compensate for deficits. Joan Webb has had improvement in functional use RUE/LUE  and RLE/LLE as well as improvement in awareness.  Working with energy conservation techniques.  Ambulates up to 150 feet contact-guard assist without assistive device.  Perform bed mobility with supervision ambulates to the sink for ADLs contact-guard assist.  Perform oral hygiene brushing her hair supervision.  He can gather her belongings for activities the living homemaking.  Full family teaching completed plan discharge to home       Disposition: Discharge disposition: 01-Home or Self Care     Discharge to home   Diet: Diabetic diet  Special Instructions: No driving smoking or alcohol  Sternal precautions  Medications at discharge 1.  Tylenol as needed 2.  Eliquis 10 mg twice daily until 04/14/2019 then 5 mg twice daily 3.  Aspirin 81 mg p.o. daily 4.  Lipitor 80 mg p.o. daily 5.  Colace 200 mg p.o. daily 6.  Iron daily 7.  Neurontin 300 mg p.o. twice daily 8.  Glucotrol 10 mg p.o. twice daily 9.  Mucinex 600 mg p.o. twice daily 10.  janumet 50-1000 mg BID 11.  Melatonin 1.5 mg p.o. nightly 12.  Lopressor 25 mg p.o. twice daily 13.  Dulera 2 puffs twice daily 14.  NicoDerm patch taper as directed 15.  Oxycodone 5 to 10 mg every 6 hours as needed pain 16.  Protonix 40 mg p.o. daily 17.  MiraLAX twice daily hold for loose stools  Discharge Instructions    Ambulatory referral to Neurology   Complete by: As  directed    An appointment is requested in approximately 4 weeks left subcortical lacunar infarction   Ambulatory referral to Physical Medicine Rehab   Complete by: As  directed    Moderate complexity follow up 1-2 weeks left subcortical/lacunar infarction      Follow-up Information    Marcello FennelPatel, Ankit Anil, MD Follow up.   Specialty: Physical Medicine and Rehabilitation Why: Office to call for appointment Contact information: [address] [place] [address] [phone]        Kerin PernaVan Trigt, Peter, MD Follow up.   Specialty: Cardiothoracic Surgery Why: Call for appointment Contact information: [address] [place] [address] [phone]        [facility] Follow up.   Contact information: [address] [place] [id number] [phone]          Signed: Mcarthur RossettiDaniel J Meredyth Hornung 04/08/2019, 5:22 AM

## 2019-04-07 NOTE — Progress Notes (Signed)
Speech Language Pathology Discharge Summary  Patient Details  Name: Joan Webb MRN: [id number] Date of Birth: 12-03-1970  Today's Date: 04/08/2019 SLP Individual Time: 0815-0900 SLP Individual Time Calculation (min): 45 min   Skilled Therapeutic Interventions: Skilled treatment session focused on cognitive goals. Upon arrival, patient was standing up at the sink performing grooming tasks. Patient knew she was not to be alone while standing and reported that is why she had her wheelchair placed behind her. SLP facilitated session by providing continued education in regards to need for supervision when ambulating and strategies to utilize at home to maximize her recall of functional information and overall safety. She verbalized understanding of all information. Patient left upright in bed with alarm on and all needs within reach. Continue with current plan of care.     Patient has met 4 of 4 long term goals.  Patient to discharge at overall Supervision level.   Reasons goals not met: n/a   Clinical Impression/Discharge Summary:   Pt made functional gains and met 4 out of 4 long term goals this admission. Pt currently requires Supervision assist for complex cognitive tasks and will require 24/7 supervision. Pt has demonstrated improved semi-complex to complex problem solving, organization, awareness and compliance with safety precautions, and sustained attention to tasks. Although pt should have 24/7 supervision upon her return home, her cognitive function is believed to be at baseline and no follow up ST is indicated. Pt and family education has been ongoing and is complete at this time.   Care Partner:  Caregiver Able to Provide Assistance: Yes  Type of Caregiver Assistance: Cognitive  Recommendation:  24 hour supervision/assistance  Rationale for SLP Follow Up: Other (comment)(no follow up ST indicated)   Equipment: none   Reasons for discharge: Discharged from hospital    Patient/Family Agrees with Progress Made and Goals Achieved: Yes    [name] 04/07/2019, 12:26 PM

## 2019-04-07 NOTE — Progress Notes (Signed)
Physical Therapy Session Note  Patient Details  Name: Joan Webb MRN: [id number] Date of Birth: 05/13/1971  Today's Date: 04/07/2019 PT Individual Time: 1655-3748 PT Individual Time Calculation (min): 73 min   Short Term Goals: Week 1:  PT Short Term Goal 1 (Week 1): = LTGs due to ELOS  Skilled Therapeutic Interventions/Progress Updates:   Received pt sitting EOB facetiming mother. Pt agreeable to therapy and denied any pain, stating she received pain medication 10 minutes prior. Session focused on functional mobility/transfers, ambulation, education on LUE ROM restrictions, balance/coordination, LE strength, and improved activity tolerance. Pt performed bed mobility with supervision and ambulated 59ft to sink without AD CGA. Pt performed oral hygiene and brushed hair with supervision. Pt frustrated with fine motor control when attempting to manipulate toothpaste and lid. Pt continues to require max cues to remain on task and tends to get distracted telling stories in the middle of tasks. Pt ambulated 19ft without AD CGA to therapy gym and performed car transfer without AD and with supervision. Pt ambulated 52ft over uneven surfaces (ramp), over mulch, and navigated 1 curb CGA without AD. Pt ambulated 66ft CGA without AD to rehab apartment and performed furniture transfer from couch and from recliner without AD with supervision. Pt ambulated 69ft to therapy gym without AD CGA. On agility ladder, pt performed forward high knees x 2 laps, side stepping x 2 laps, and in/out variations x 2 laps without AD CGA. Pt reported minor dizziness when turning and was instructed to take standing rest breaks when necessary. Inside parallel bars pt performed 1x12 bilateral heel raises with bilateral UE support CGA, Pt performed 1x10 R single leg heel raises with bilateral UE support CGA. Pt reported her LLE was too swollen to attempt heel raises. Pt performed 1x12 bilateral hip abduction with bilateral UE support on  parallel bars CGA, Pt ambulated 169ft without AD CGA back to room. Concluded session with pt sitting in recliner, needs within reach, and chair pad alarm on.   Therapy Documentation Precautions:  Precautions Precautions: Sternal, Fall, Other (comment) Precaution Booklet Issued: No Precaution Comments: R-side hemiparesis Restrictions Weight Bearing Restrictions: No Other Position/Activity Restrictions: sternal precautions  Therapy/Group: Individual Therapy Alfonse Alpers PT, DPT   04/07/2019, 7:45 AM

## 2019-04-07 NOTE — Progress Notes (Signed)
Occupational Therapy Session Note  Patient Details  Name: Joan Webb MRN: [id number] Date of Birth: Nov 15, 1970  Today's Date: 04/07/2019 OT Individual Time: 1300-1400 OT Individual Time Calculation (min): 60 min   Skilled Therapeutic Interventions/Progress Updates:    Pt greeted seated in recliner and agreeable to OT treatment session. Pt requesting to go outside this afternoon. Pt's ex-husband present for session. Pt sat in recliner and donned pants and jacket with supervision and verbal cues to sit down and not try to step into pants for safety. Pt than ambulated with RW to elevators and was able to push elevator button. Educated on community reintegration and safety within community environment. Pt ambulated in/out of elevator and on uneven surfaces outside with supervision. Pt sat in wc for rest and completed R UE exercises, grasp/release, pronation supination, elbow flex/ext and shoulder flex/ext. Pt with some increased swelling in R hand. Pt ambulated back up to room in similar fashion without rest break. OT applied kinesiotape to R hand for edema management. Pt left seated in recliner with ex-husband present and needs met.   Therapy Documentation Precautions:  Precautions Precautions: Sternal, Fall, Other (comment) Precaution Booklet Issued: No Precaution Comments: R-side hemiparesis Restrictions Weight Bearing Restrictions: No Other Position/Activity Restrictions: sternal precautions Pain: Pain Assessment Pain Scale: Faces Pain Score: 0-No pain Faces Pain Scale: No hurt   Therapy/Group: Individual Therapy  Valma Cava 04/07/2019, 2:03 PM

## 2019-04-07 NOTE — Progress Notes (Signed)
Pt c/o left  arm pain especially in elbow area arm is warm with positive radial and brachial pulses. Upper arm measures 33 cm  Mid arm (elbow area) 30.5 cm and wrist measures 18 cm. Capillary refill is less than three seconds. Pt medicated for pain.

## 2019-04-07 NOTE — Progress Notes (Signed)
Speech Language Pathology Daily Session Note  Patient Details  Name: Cambrey Lupi MRN: [id number] Date of Birth: [birth year]  Today's Date: 04/07/2019 SLP Individual Time: 0730-0825 SLP Individual Time Calculation (min): 55 min  Short Term Goals: Week 1: SLP Short Term Goal 1 (Week 1): STG=LTG due to ELOS  Skilled Therapeutic Interventions: Pt was seen for skilled ST targeting cognitive goals. SLP facilitated session with Min faded to Supervision A verbal cues for planning, problem solving, and organization during a personally relevant and functional medication price comparison activity. Pt used list of current medications to identify which would be Rx versus over the counter Mod I 100% accuracy. Pt then initiated research of pharmacies in her area with Supervision A question cues for problem solving. Modifications were made to assist pt with legible handwriting throughout activity (due to right and left hand ROM/strength limitations), however pt verbally instructed SLP throughout list making process. She sustained attention throughout tasks during ~60 minute session with only Supervision A verbal cues for redirection. Pt was left laying in bed with alarm set and all needs within reach. Continue per current plan of care.       Pain Pain Assessment Pain Scale: Faces Faces Pain Scale: No hurt  Therapy/Group: Individual Therapy  [name] 04/07/2019, 10:13 AM

## 2019-04-08 ENCOUNTER — Inpatient Hospital Stay (HOSPITAL_COMMUNITY): Payer: Self-pay | Admitting: Physical Therapy

## 2019-04-08 ENCOUNTER — Inpatient Hospital Stay (HOSPITAL_COMMUNITY): Payer: Self-pay | Admitting: Speech Pathology

## 2019-04-08 ENCOUNTER — Inpatient Hospital Stay (HOSPITAL_COMMUNITY): Payer: Self-pay | Admitting: Occupational Therapy

## 2019-04-08 LAB — CBC WITH DIFFERENTIAL/PLATELET
Abs Immature Granulocytes: 0.05 10*3/uL (ref 0.00–0.07)
Basophils Absolute: 0.1 10*3/uL (ref 0.0–0.1)
Basophils Relative: 1 %
Eosinophils Absolute: 0.1 10*3/uL (ref 0.0–0.5)
Eosinophils Relative: 1 %
HCT: 31.7 % — ABNORMAL LOW (ref 36.0–46.0)
Hemoglobin: 10.2 g/dL — ABNORMAL LOW (ref 12.0–15.0)
Immature Granulocytes: 1 %
Lymphocytes Relative: 18 %
Lymphs Abs: 1.6 10*3/uL (ref 0.7–4.0)
MCH: 27.9 pg (ref 26.0–34.0)
MCHC: 32.2 g/dL (ref 30.0–36.0)
MCV: 86.8 fL (ref 80.0–100.0)
Monocytes Absolute: 0.5 10*3/uL (ref 0.1–1.0)
Monocytes Relative: 6 %
Neutro Abs: 6.8 10*3/uL (ref 1.7–7.7)
Neutrophils Relative %: 73 %
Platelets: 172 10*3/uL (ref 150–400)
RBC: 3.65 MIL/uL — ABNORMAL LOW (ref 3.87–5.11)
RDW: 13.9 % (ref 11.5–15.5)
WBC: 9.2 10*3/uL (ref 4.0–10.5)
nRBC: 0 % (ref 0.0–0.2)

## 2019-04-08 LAB — GLUCOSE, CAPILLARY
Glucose-Capillary: 102 mg/dL — ABNORMAL HIGH (ref 70–99)
Glucose-Capillary: 193 mg/dL — ABNORMAL HIGH (ref 70–99)
Glucose-Capillary: 263 mg/dL — ABNORMAL HIGH (ref 70–99)
Glucose-Capillary: 149 mg/dL — ABNORMAL HIGH (ref 70–99)

## 2019-04-08 MED ORDER — MELATONIN 3 MG PO TABS: 1.5000 mg | ORAL_TABLET | Freq: Every day | ORAL | 0 refills | Status: DC

## 2019-04-08 MED ORDER — QC VITAMIN D3 125 MCG (5000 UT) PO TABS: 1.0000 | ORAL_TABLET | Freq: Every day | ORAL | 1 refills | Status: DC

## 2019-04-08 MED ORDER — NICOTINE 14 MG/24HR TD PT24: MEDICATED_PATCH | TRANSDERMAL | 0 refills | Status: DC

## 2019-04-08 MED ORDER — ATORVASTATIN CALCIUM 80 MG PO TABS: 80.0000 mg | ORAL_TABLET | Freq: Every day | ORAL | 1 refills | Status: DC

## 2019-04-08 MED ORDER — GUAIFENESIN ER 600 MG PO TB12: 600.0000 mg | ORAL_TABLET | Freq: Two times a day (BID) | ORAL | 0 refills | Status: DC

## 2019-04-08 MED ORDER — GLIPIZIDE 10 MG PO TABS: 10.0000 mg | ORAL_TABLET | Freq: Two times a day (BID) | ORAL | 1 refills | Status: DC

## 2019-04-08 MED ORDER — DOCUSATE SODIUM 100 MG PO CAPS: 200.0000 mg | ORAL_CAPSULE | Freq: Every day | ORAL | 0 refills | Status: DC

## 2019-04-08 MED ORDER — APIXABAN 5 MG PO TABS: 10.0000 mg | ORAL_TABLET | Freq: Two times a day (BID) | ORAL | 0 refills | Status: DC

## 2019-04-08 MED ORDER — FUROSEMIDE 40 MG PO TABS: 40.0000 mg | ORAL_TABLET | Freq: Every day | ORAL | 0 refills | Status: DC

## 2019-04-08 MED ORDER — METOPROLOL TARTRATE 25 MG PO TABS: 25.0000 mg | ORAL_TABLET | Freq: Two times a day (BID) | ORAL | 0 refills | Status: DC

## 2019-04-08 MED ORDER — APIXABAN 5 MG PO TABS: 5.0000 mg | ORAL_TABLET | Freq: Two times a day (BID) | ORAL | 1 refills | Status: DC

## 2019-04-08 MED ORDER — MOMETASONE FURO-FORMOTEROL FUM 100-5 MCG/ACT IN AERO: 2.0000 | INHALATION_SPRAY | Freq: Two times a day (BID) | RESPIRATORY_TRACT | 1 refills | Status: DC

## 2019-04-08 MED ORDER — SITAGLIPTIN PHOS-METFORMIN HCL 50-1000 MG PO TABS: 1.0000 | ORAL_TABLET | Freq: Two times a day (BID) | ORAL | 0 refills | Status: DC

## 2019-04-08 MED ORDER — OXYCODONE HCL 5 MG PO TABS: 5.0000 mg | ORAL_TABLET | Freq: Four times a day (QID) | ORAL | 0 refills | Status: DC | PRN

## 2019-04-08 MED ORDER — PANTOPRAZOLE SODIUM 40 MG PO TBEC: 40.0000 mg | DELAYED_RELEASE_TABLET | Freq: Every day | ORAL | 0 refills | Status: DC

## 2019-04-08 MED ORDER — GABAPENTIN 300 MG PO CAPS: 300.0000 mg | ORAL_CAPSULE | Freq: Two times a day (BID) | ORAL | 1 refills | Status: DC

## 2019-04-08 MED ORDER — POLYETHYLENE GLYCOL 3350 17 G PO PACK: 17.0000 g | PACK | Freq: Two times a day (BID) | ORAL | 0 refills | Status: DC

## 2019-04-08 MED ORDER — ACETAMINOPHEN 325 MG PO TABS: 325.0000 mg | ORAL_TABLET | ORAL | Status: AC | PRN

## 2019-04-08 MED ORDER — FERROUS SULFATE 325 (65 FE) MG PO TABS: 325.0000 mg | ORAL_TABLET | Freq: Every day | ORAL | 0 refills | Status: DC

## 2019-04-08 NOTE — Progress Notes (Signed)
Social Work Discharge Note   The overall goal for the admission was met for: DC SAT 11/21  Discharge location: [place] [facility]  Length of Stay: Yes-10 DAYS  Discharge activity level: Yes-SUPERVISION LEVEL  Home/community participation: Yes  Services provided included: MD, RD, PT, OT, SLP, RN, CM, TR, Pharmacy, Neuropsych and SW  Financial Services: Other: MEDICAID PENDING  Follow-up services arranged: Home Health: [facility], DME: [facility] and Patient/Family has no preference for HH/DME agencies  Comments (or additional information)JOEL WAS HERE DAILY AND ATTENDED THERAPIES WITH PT [place] SAT.: MEDICAID AND DISABILITY APPLICATIONS ARE PENDING  Patient/Family verbalized understanding of follow-up arrangements: Yes  Individual responsible for coordination of the follow-up plan: SELF & JOEL-EX-HUSBAND  Confirmed correct DME delivered: [name] 04/08/2019    [name]

## 2019-04-08 NOTE — Progress Notes (Signed)
Occupational Therapy Session Note  Patient Details  Name: Joan Webb MRN: [id number] Date of Birth: 1970-10-01  Today's Date: 04/08/2019 OT Individual Time: 1105-1200 OT Individual Time Calculation (min): 55 min    Short Term Goals: Week 1:  OT Short Term Goal 1 (Week 1): Pt will be able to step over tub ledge to shower chair with supervision. OT Short Term Goal 2 (Week 1): Pt will don shirt with set up OT Short Term Goal 3 (Week 1): Pt will navigate around room with supervision to obtain supplies for ADLs  Skilled Therapeutic Interventions/Progress Updates:    Completed ADL retraining at overall Supervision level.  Pt completed toilet and shower transfers without AD at overall distant supervision level. Bathing completed at sit > stand level in room shower with min cues for safe placement of and incorporation of RUE.  Cues provided throughout session to sustain attention to task as pt extremely distracted both internally and externally.  Pt able to complete LB dressing with ability to pull pants over hips with increased time.  Completed hair brushing with use of LUE over RUE to incorporate RUE in to functional task.  Provided pt with Fine motor control HEP and educated on exercises to complete at home.  Pt pleased with progress and anxious to go home.    Therapy Documentation Precautions:  Precautions Precautions: Sternal, Fall, Other (comment) Precaution Booklet Issued: No Precaution Comments: R-side hemiparesis Restrictions Weight Bearing Restrictions: No Other Position/Activity Restrictions: sternal precautions Pain: Pain Assessment Pain Scale: 0-10 Pain Score: 5  Pain Type: Acute pain Pain Location: Chest Pain Orientation: Anterior;Mid Pain Descriptors / Indicators: Aching Pain Onset: On-going Pain Intervention(s): Shower;Repositioned ADL: ADL Eating: Set up Grooming: Supervision/safety Upper Body Bathing: Supervision/safety Where Assessed-Upper Body Bathing:  Shower Lower Body Bathing: Supervision/safety Where Assessed-Lower Body Bathing: Shower Upper Body Dressing: Supervision/safety, Setup Lower Body Dressing: Supervision/safety Toileting: Supervision/safety Where Assessed-Toileting: Glass blower/designer: Distant supervision Armed forces technical officer Method: Ambulating Tub/Shower Transfer: Distant supervision Tub/Shower Transfer Method: Ambulating Tub/Shower Equipment: Transfer tub bench   Therapy/Group: Individual Therapy  Simonne Come 04/08/2019, 12:41 PM

## 2019-04-08 NOTE — Progress Notes (Signed)
[facility] PHYSICAL MEDICINE & REHABILITATION PROGRESS NOTE  Subjective/Complaints: Patient seen sitting up at EOB this AM.  She states she slept very well overnight. She notes improvement in LUE edema and pain. She also notes improvement in right hand function.   ROS: + Left arm pain, improving.  Denies SOB, N/V/D  Objective: Vital Signs: Blood pressure 105/61, pulse 82, temperature 98.3 F (36.8 C), temperature source Oral, resp. rate 20, height 5\' 2"  (1.575 m), weight 81.1 kg, SpO2 96 %. No results found. Recent Labs    04/07/19 0717  WBC 8.4  HGB 9.5*  HCT 29.3*  PLT 138*   Recent Labs    04/06/19 0449 04/07/19 0717  NA  --  134*  K  --  4.1  CL  --  96*  CO2  --  26  GLUCOSE  --  164*  BUN  --  16  CREATININE 0.59 0.61  CALCIUM  --  9.0    Physical Exam: BP 105/61 (BP Location: Right Arm)   Pulse 82   Temp 98.3 F (36.8 C) (Oral)   Resp 20   Ht 5\' 2"  (1.575 m)   Wt 81.1 kg   SpO2 96%   BMI 32.70 kg/m  Constitutional: No distress . Vital signs reviewed. HENT: Normocephalic.  Atraumatic. Eyes: EOMI. No discharge. Cardiovascular: No JVD. Respiratory: Normal effort.  No stridor. GI: Non-distended. Skin: Midline chest incision C/D/I. Psych: Normal mood.  Normal behavior. Musc: Left upper extremity with edema and tenderness, slight improvement Neuro: Alert Makes good eye contact.   Follows commands. Motor:  Left lower extremity: 5/5 proximal distal Right upper extremity: Shoulder abduction elbow flexion/extension 4-4+/5, wrist extension 3-3+/5, handgrip 3+/5, with apraxia, improving Right lower extremity: 4+/5 proximal to distal, stable  Assessment/Plan: 1. Functional deficits secondary to left cerebral infarct which require 3+ hours per day of interdisciplinary therapy in a comprehensive inpatient rehab setting.  Physiatrist is providing close team supervision and 24 hour management of active medical problems listed below.  Physiatrist and rehab  team continue to assess barriers to discharge/monitor patient progress toward functional and medical goals  Care Tool:  Bathing  Bathing activity did not occur: Refused Body parts bathed by patient: Right arm, Abdomen, Chest, Front perineal area, Right upper leg, Left upper leg, Right lower leg, Left lower leg, Face   Body parts bathed by helper: Buttocks     Bathing assist Assist Level: Minimal Assistance - Patient > 75%     Upper Body Dressing/Undressing Upper body dressing Upper body dressing/undressing activity did not occur (including orthotics): (she'd already dressed for the day) What is the patient wearing?: Button up shirt Orthosis activity level: (Pt assisted)  Upper body assist Assist Level: Maximal Assistance - Patient 25 - 49%    Lower Body Dressing/Undressing Lower body dressing      What is the patient wearing?: Pants     Lower body assist Assist for lower body dressing: Minimal Assistance - Patient > 75%     Toileting Toileting Toileting Activity did not occur Press photographer and hygiene only): Refused  Toileting assist Assist for toileting: Minimal Assistance - Patient > 75%     Transfers Chair/bed transfer  Transfers assist     Chair/bed transfer assist level: Supervision/Verbal cueing     Locomotion Ambulation   Ambulation assist      Assist level: Contact Guard/Touching assist Assistive device: No Device Max distance: 173ft   Walk 10 feet activity   Assist  Assist level: Contact Guard/Touching assist Assistive device: No Device   Walk 50 feet activity   Assist Walk 50 feet with 2 turns activity did not occur: Safety/medical concerns(pt fatigue)  Assist level: Contact Guard/Touching assist Assistive device: No Device    Walk 150 feet activity   Assist Walk 150 feet activity did not occur: Safety/medical concerns(pt fatigue)  Assist level: Contact Guard/Touching assist Assistive device: No Device    Walk 10  feet on uneven surface  activity   Assist Walk 10 feet on uneven surfaces activity did not occur: Safety/medical concerns(Fatigue per report )   Assist level: Contact Guard/Touching assist     Wheelchair     Assist Will patient use wheelchair at discharge?: No   Wheelchair activity did not occur: Safety/medical concerns(sternal precautions)         Wheelchair 50 feet with 2 turns activity    Assist            Wheelchair 150 feet activity     Assist            Medical Problem List and Plan: 1.  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  Sternal precautions  Continue CIR, plan for d/c tomorrow  Will see patient for transitional care management in 1-2 weeks post-discharge  WHO for RUE 2.  Antithrombotics: -DVT/anticoagulation: SCDs.  Acute LUE DVT, U/S personally reviewed, discussed with radiology regarding read.  Discussed with CTS, cont Eliquis   Cr. WNL on 11/19             -antiplatelet therapy: Aspirin 81 mg daily, Plavix 25 mg daily x3 weeks then aspirin alone 3. Pain Management: Neurontin 300 mg twice daily, oxycodone/Ultram as needed, continues to use for sternal pain, well controlled with medication.  4. Mood: Provide emotional support             -antipsychotic agents: N/A 5. Neuropsych: This patient is not fully capable of making decisions on her own behalf. 6. Skin/Wound Care: Routine skin checks 7. Fluids/Electrolytes/Nutrition: Routine in and outs.   8.  Acute blood loss anemia.    Hemoglobin 9.5 on 11/19  Continue to monitor 9.  Hypertension.  Lopressor 25 mg twice daily, Lasix 40 mg  Daily  Controlled on 11/20  Monitor with increased mobility 10.  Diabetes mellitus with peripheral neuropathy with hyperglycemia.  Hemoglobin A1c 7.9.    Glucotrol 10 mg twice daily  Levemir 10 units twice daily, increase  to 13 twice daily on 11/17.    Check blood sugars before meals and at bedtime  Labile on 11/20, need to encourage consistent diet compliance  Consider further adjustments as necessary  Monitor with increased mobility 11.  Tobacco abuse.  Continue NicoDerm patch as well as inhalers as directed.  Provide counseling 12.  Hyperlipidemia.  Lipitor 13.  Obesity.  BMI 32.98.  Dietary follow-up.  Encourage weight loss. 14.  Post stroke cognitive deficit             SLP for higher-level cognition 15.  Slow transit constipation  KUB personally reviewed, unremarkable  Bowel meds increased on 11/12  Additional meds given x1 on 11/13, however patient did not take.  Discussed compliance with bowel meds  Improving overall 16.  Hyponatremia  Sodium 134 on 11/19  Continue to monitor 17.  Hypoalbuminemia  Supplement initiated on 11/13 18. Sleep disturbance  Melatonin started on 11/15  Improving 19.  Thrombocytopenia  Platelets 138 on 11/19  Significant decrease since 11/12, labs pending  LOS: 9 days A FACE TO FACE EVALUATION WAS PERFORMED  Ankit Karis Juba 04/08/2019, 8:02 AM

## 2019-04-08 NOTE — Progress Notes (Signed)
  Subjective: Breathing well  excited about DC tomorrow nsr Incisions healing Objective: Vital signs in last 24 hours: Temp:  [98.3 F (36.8 C)-98.4 F (36.9 C)] 98.3 F (36.8 C) (11/19 1940) Pulse Rate:  [78-82] 82 (11/19 2005) Resp:  [17-20] 20 (11/19 2005) BP: (101-105)/(61-62) 105/61 (11/19 1940) SpO2:  [96 %-100 %] 96 % (11/20 0720)  Hemodynamic parameters for last 24 hours:  nsr  Intake/Output from previous day: 11/19 0701 - 11/20 0700 In: 680 [P.O.:680] Out: -  Intake/Output this shift: Total I/O In: 240 [P.O.:240] Out: -        Exam    General- alert and comfortable    Neck- no JVD, no cervical adenopathy palpable, no carotid bruit   Lungs- clear without rales, wheezes   Cor- regular rate and rhythm, no murmur , gallop   Abdomen- soft, non-tender   Extremities - warm, non-tender, minimal edema   Neuro- oriented, appropriate,improved function R arm, hand   Lab Results: Recent Labs    04/07/19 0717  WBC 8.4  HGB 9.5*  HCT 29.3*  PLT 138*   BMET:  Recent Labs    04/06/19 0449 04/07/19 0717  NA  --  134*  K  --  4.1  CL  --  96*  CO2  --  26  GLUCOSE  --  164*  BUN  --  16  CREATININE 0.59 0.61  CALCIUM  --  9.0    PT/INR: No results for input(s): LABPROT, INR in the last 72 hours. ABG    Component Value Date/Time   PHART 7.358 03/26/2019 0431   HCO3 21.5 03/26/2019 0431   TCO2 23 03/26/2019 0431   ACIDBASEDEF 3.0 (H) 03/26/2019 0431   O2SAT 56.8 03/30/2019 0950   CBG (last 3)  Recent Labs    04/07/19 1629 04/07/19 2103 04/08/19 0645  GLUCAP 191* 191* 102*    Assessment/Plan: S/P  post MI then  CABG Postop CVA Postop L axillary DVT- on Eliquis, 81 ASA Will follow in office Appreciate excellent CIR care/therapies  LOS: 9 days    Joan Webb 04/08/2019

## 2019-04-08 NOTE — Progress Notes (Signed)
Social Work Patient ID: Joan Webb, female   DOB: [birth year], 48 y.o.   MRN: [id number]  Met with pt and ex-husband who both feel she is ready to go home tomorrow. Equipment to come up today and home health arranged via [facility]. Gave information on [facility] in [place] for a PCP followed.

## 2019-04-08 NOTE — Progress Notes (Signed)
Physical Therapy Session Note  Patient Details  Name: Joan Webb MRN: [id number] Date of Birth: 04-15-1971  Today's Date: 04/08/2019 PT Individual Time: 8022-1798 PT Individual Time Calculation (min): 56 min   Short Term Goals: Week 1:  PT Short Term Goal 1 (Week 1): = LTGs due to ELOS  Skilled Therapeutic Interventions/Progress Updates:   Pt received supine in bed and agreeable to PT. Supine>sit transfer without assist or cues. PT treatment focused on functional gait training, forced of the RUE and dynamic balance training. Pt performed gait training with RW x [id number] with distand supervision assist from PT and RUE in hand orthotic. Also performed dynamic gait training without AD through day room and distant supervision assist. Dynamic balanc training while engaged in fine motor task of wii bowling and wii boxing. Use of RUE with boxing with multiple cues for attention to sustained grasp. Pt had no noted LOB and required only distant supervision assist for safety intermittently. Pt returned to room and performed stand pivot transfer to bed with no AD and distant supervision assist from PT. Sit>supine completed without assist, and left supine in bed with call bell in reach and all needs met.        Therapy Documentation Precautions:  Precautions Precautions: Sternal, Fall, Other (comment) Precaution Booklet Issued: No Precaution Comments: R-side hemiparesis Restrictions Weight Bearing Restrictions: No Other Position/Activity Restrictions: sternal precautions    Pain: denies   Therapy/Group: Individual Therapy  ALorie Phenix11/20/2020, 5:22 PM

## 2019-04-08 NOTE — Progress Notes (Signed)
Physical Therapy Discharge Summary  Patient Details  Name: Joan Webb MRN: [id number] Date of Birth: 1970-10-05  Today's Date: 04/08/2019 PT Individual Time: 1330-1425 PT Individual Time Calculation (min): 55 min   Pt received toileting and agreeable to therapy, no c/o pain. Ambulated to/from outside of hospital w/ RW and supervision, pt declined rest break. Moderate increase in work of breathing, pt did c/o chest "pressure", denied chest pain. Educated on energy conservation strategies and continuing to gradually build up endurance. Ambulated over uneven surfaces w/ RW and up/down 16 steps w/ supervision w/o UE support on rails. Self-selected step-to pattern on stairs for safety. Intermittent seated rest breaks 2/2 fatigue. Returned to unit and performed bed mobility, independently. Assisted pt w/ picking up items in room, verbal reminders for sternal precautions. Pt able to recall 3/3 sternal precautions, but does not appear to implement these functionally w/o cues. Ended session in recliner and in care of RN, all needs in reach.   Patient has met 10 of 10 long term goals due to improved activity tolerance, improved balance and functional use of  right lower extremity.  Patient to discharge at an ambulatory level Supervision.   Patient's care partner is independent to provide the necessary physical assistance at discharge. Pt's ex-husband (primary caregiver) has been present throughout pt's stay and participates in therapy session. Husband feels confident in providing 24/7 supervision for pt at d/c. He is very supportive of pt and her recovery.   Reasons goals not met: n/a  Recommendation:  Patient will benefit from ongoing skilled PT services in home health setting to continue to advance safe functional mobility, address ongoing impairments in functional balance, RLE strength, and endurance, and minimize fall risk.  Equipment: RW w/ RUE orthosis  Reasons for discharge: treatment goals met  and discharge from hospital  Patient/family agrees with progress made and goals achieved: Yes  PT Discharge Precautions/Restrictions Precautions Precautions: Sternal;Fall;Other (comment) Precaution Comments: R-side hemiparesis Restrictions Weight Bearing Restrictions: No Pain Pain Assessment Pain Scale: 0-10 Pain Score: 5  Pain Type: Acute pain Pain Location: Chest Pain Orientation: Mid;Anterior Pain Descriptors / Indicators: Aching Pain Onset: On-going Pain Intervention(s): Medication (See eMAR) Vision/Perception  Perception Perception: Within Functional Limits Praxis Praxis: Impaired Praxis Impairment Details: Motor planning  Cognition Overall Cognitive Status: Impaired/Different from baseline Arousal/Alertness: Awake/alert Orientation Level: Oriented X4 Sustained Attention: Appears intact Memory: Impaired Memory Impairment: Decreased short term memory Immediate Memory Recall: Sock;Blue;Bed Memory Recall Sock: Without Cue Memory Recall Blue: Without Cue Memory Recall Bed: Without Cue Awareness: Impaired Awareness Impairment: Emergent impairment Problem Solving: Appears intact Behaviors: Impulsive Safety/Judgment: Appears intact Sensation Sensation Light Touch: Impaired Detail Light Touch Impaired Details: Impaired RLE;Impaired LLE Proprioception: Impaired Detail Proprioception Impaired Details: Absent RLE;Impaired RUE Coordination Gross Motor Movements are Fluid and Coordinated: No Fine Motor Movements are Fluid and Coordinated: No Coordination and Movement Description: Right hand decr coordination (especially distally) Motor  Motor Motor: Hemiplegia Motor - Discharge Observations: Mild R hemi, UE>LE  Mobility Bed Mobility Bed Mobility: Rolling Right;Rolling Left;Sit to Supine;Supine to Sit Rolling Right: Independent Rolling Left: Independent Supine to Sit: Independent Sit to Supine: Independent Transfers Transfers: Stand to Sit;Sit to Stand;Stand  Pivot Transfers Sit to Stand: Supervision/Verbal cueing Stand to Sit: Supervision/Verbal cueing Stand Pivot Transfers: Supervision/Verbal cueing Transfer (Assistive device): None Locomotion  Gait Ambulation: Yes Gait Assistance: Supervision/Verbal cueing (CGA w/o AD)  Gait Distance (Feet): 150 Feet Assistive device: Rolling walker Stairs / Additional Locomotion Stairs: Yes Stairs Assistance: Supervision/Verbal cueing Stair Management Technique: No rails;Step  to pattern Number of Stairs: 12 Height of Stairs: 4 Wheelchair Mobility Wheelchair Mobility: No  Trunk/Postural Assessment  Cervical Assessment Cervical Assessment: Within Functional Limits Thoracic Assessment Thoracic Assessment: Within Functional Limits Lumbar Assessment Lumbar Assessment: Within Functional Limits Postural Control Postural Control: Within Functional Limits  Balance Balance Balance Assessed: Yes Static Sitting Balance Static Sitting - Level of Assistance: 7: Independent Dynamic Sitting Balance Dynamic Sitting - Level of Assistance: 7: Independent Static Standing Balance Static Standing - Level of Assistance: 5: Stand by assistance Dynamic Standing Balance Dynamic Standing - Level of Assistance: 5: Stand by assistance Extremity Assessment  RLE Assessment RLE Assessment: Exceptions to [facility] Passive Range of Motion (PROM) Comments: [facility] General Strength Comments: Globally 4/5 LLE Assessment LLE Assessment: Within Functional Limits    Ngai Parcell K Elius Etheredge 04/08/2019, 2:30 PM

## 2019-04-08 NOTE — Progress Notes (Signed)
Occupational Therapy Discharge Summary  Patient Details  Name: Joan Webb MRN: [id number] Date of Birth: Mar 03, 1971  Patient has met 69 of 10 long term goals due to improved activity tolerance, ability to compensate for deficits, functional use of  RIGHT upper extremity, improved attention and improved awareness.  Patient to discharge at overall Supervision level.  Patient's care partner is independent to provide the necessary cognitive assistance at discharge.  Continue to recommend supervision at home due to decreased safety awareness.  Reasons goals not met: N/A  Recommendation:  Patient will benefit from ongoing skilled OT services in home health setting to continue to advance functional skills in the area of BADL and Reduce care partner burden.  Equipment: tub bench  Reasons for discharge: treatment goals met and discharge from hospital  Patient/family agrees with progress made and goals achieved: Yes  OT Discharge Precautions/Restrictions  Precautions Precautions: Sternal;Fall;Other (comment) Precaution Comments: R-side hemiparesis Restrictions Weight Bearing Restrictions: No Pain Pain Assessment Pain Scale: 0-10 Pain Score: 5  Pain Type: Acute pain Pain Location: Chest Pain Orientation: Anterior;Mid Pain Descriptors / Indicators: Aching Pain Onset: On-going Pain Intervention(s): Shower;Repositioned ADL ADL Eating: Set up Grooming: Supervision/safety Upper Body Bathing: Supervision/safety Where Assessed-Upper Body Bathing: Shower Lower Body Bathing: Supervision/safety Where Assessed-Lower Body Bathing: Shower Upper Body Dressing: Supervision/safety, Setup Lower Body Dressing: Supervision/safety Toileting: Supervision/safety Where Assessed-Toileting: Glass blower/designer: Distant supervision Armed forces technical officer Method: Ambulating Tub/Shower Transfer: Distant supervision Tub/Shower Transfer Method: Ambulating Tub/Shower Equipment: Actor Vision Baseline Vision/History: Wears glasses Wears Glasses: At all times Patient Visual Report: No change from baseline Vision Assessment?: No apparent visual deficits Perception  Perception: Within Functional Limits Praxis Praxis: Impaired Praxis Impairment Details: Motor planning Cognition Overall Cognitive Status: Impaired/Different from baseline Arousal/Alertness: Awake/alert Orientation Level: Oriented X4 Sustained Attention: Appears intact Memory: Impaired Memory Impairment: Decreased short term memory Immediate Memory Recall: Sock;Blue;Bed Memory Recall Sock: Without Cue Memory Recall Blue: Without Cue Memory Recall Bed: Without Cue Awareness: Impaired Awareness Impairment: Emergent impairment Problem Solving: Appears intact Behaviors: Impulsive Safety/Judgment: Appears intact Sensation Sensation Light Touch: Impaired Detail Light Touch Impaired Details: Impaired RLE;Impaired LLE Proprioception: Impaired Detail Proprioception Impaired Details: Absent RLE;Impaired RUE Coordination Gross Motor Movements are Fluid and Coordinated: No Fine Motor Movements are Fluid and Coordinated: No Coordination and Movement Description: Right hand decr coordination (especially distally) Extremity/Trunk Assessment RUE Assessment RUE Assessment: Exceptions to [facility] Active Range of Motion (AROM) Comments: 0-90 degrees General Strength Comments: 3/5 - improved wrist and finger flexion/extension, loose gross grasp RUE Body System: Neuro Brunstrum levels for arm and hand: Arm;Hand Brunstrum level for arm: Stage IV Movement is deviating from synergy Brunstrum level for hand: Stage III Synergies performed voluntarily RUE Tone RUE Tone: Within Functional Limits LUE Assessment LUE Assessment: Within Functional Limits   Sherron Mapp 04/08/2019, 12:42 PM

## 2019-04-09 LAB — GLUCOSE, CAPILLARY: Glucose-Capillary: 163 mg/dL — ABNORMAL HIGH (ref 70–99)

## 2019-04-09 NOTE — Progress Notes (Signed)
[place] PHYSICAL MEDICINE & REHABILITATION PROGRESS NOTE  Subjective/Complaints: Up in bed excited to go home!. Pleased with progress. Had questions about her dc meds. Left shoulder still tender  ROS: Patient denies fever, rash, sore throat, blurred vision, nausea, vomiting, diarrhea, cough, shortness of breath or chest pain,  headache, or mood change.    Objective: Vital Signs: Blood pressure 113/64, pulse 70, temperature 97.9 F (36.6 C), resp. rate 18, height 5\' 2"  (1.575 m), weight 81.1 kg, SpO2 98 %. No results found. Recent Labs    04/07/19 0717 04/08/19 1014  WBC 8.4 9.2  HGB 9.5* 10.2*  HCT 29.3* 31.7*  PLT 138* 172   Recent Labs    04/07/19 0717  NA 134*  K 4.1  CL 96*  CO2 26  GLUCOSE 164*  BUN 16  CREATININE 0.61  CALCIUM 9.0    Physical Exam: BP 113/64 (BP Location: Right Leg)   Pulse 70   Temp 97.9 F (36.6 C)   Resp 18   Ht 5\' 2"  (1.575 m)   Wt 81.1 kg   SpO2 98%   BMI 32.70 kg/m  Constitutional: No distress . Vital signs reviewed. HEENT: EOMI, oral membranes moist Neck: supple Cardiovascular: RRR without murmur. No JVD    Respiratory: CTA Bilaterally without wheezes or rales. Normal effort    GI: BS +, non-tender, non-distended  Skin: Midline chest incision C/D/I. Psych: Normal mood.  Normal behavior. Musc: Left upper extremity with edema and tenderness during PROM Neuro: Alert Makes good eye contact.   Follows commands. occ word finding deficits Motor:  Left lower extremity: 5/5 proximal distal Right upper extremity: Shoulder abduction elbow flexion/extension 4-4+/5, wrist extension 3-3+/5, handgrip 3+/5, with apraxia, improving Right lower extremity: 4+/5 proximal to distal, stable  Assessment/Plan: 1. Functional deficits secondary to left cerebral infarct which require 3+ hours per day of interdisciplinary therapy in a comprehensive inpatient rehab setting.  Physiatrist is providing close team supervision and 24 hour management  of active medical problems listed below.  Physiatrist and rehab team continue to assess barriers to discharge/monitor patient progress toward functional and medical goals  Care Tool:  Bathing  Bathing activity did not occur: Refused Body parts bathed by patient: Right arm, Abdomen, Chest, Front perineal area, Right upper leg, Left upper leg, Right lower leg, Left lower leg, Face, Left arm, Buttocks   Body parts bathed by helper: Buttocks     Bathing assist Assist Level: Supervision/Verbal cueing     Upper Body Dressing/Undressing Upper body dressing Upper body dressing/undressing activity did not occur (including orthotics): (she'd already dressed for the day) What is the patient wearing?: Pull over shirt Orthosis activity level: (Pt assisted)  Upper body assist Assist Level: Supervision/Verbal cueing    Lower Body Dressing/Undressing Lower body dressing      What is the patient wearing?: Pants     Lower body assist Assist for lower body dressing: Supervision/Verbal cueing     Toileting Toileting Toileting Activity did not occur (Clothing management and hygiene only): Refused  Toileting assist Assist for toileting: Supervision/Verbal cueing     Transfers Chair/bed transfer  Transfers assist     Chair/bed transfer assist level: Supervision/Verbal cueing     Locomotion Ambulation   Ambulation assist      Assist level: Supervision/Verbal cueing Assistive device: Walker-rolling Max distance: >150'   Walk 10 feet activity   Assist     Assist level: Supervision/Verbal cueing Assistive device: Walker-rolling   Walk 50 feet activity   Assist  Walk 50 feet with 2 turns activity did not occur: Safety/medical concerns(pt fatigue)  Assist level: Supervision/Verbal cueing Assistive device: Walker-rolling    Walk 150 feet activity   Assist Walk 150 feet activity did not occur: Safety/medical concerns(pt fatigue)  Assist level: Supervision/Verbal  cueing Assistive device: Walker-rolling    Walk 10 feet on uneven surface  activity   Assist Walk 10 feet on uneven surfaces activity did not occur: Safety/medical concerns(Fatigue per report )   Assist level: Supervision/Verbal cueing Assistive device: Photographer Will patient use wheelchair at discharge?: No   Wheelchair activity did not occur: N/A         Wheelchair 50 feet with 2 turns activity    Assist    Wheelchair 50 feet with 2 turns activity did not occur: N/A       Wheelchair 150 feet activity     Assist Wheelchair 150 feet activity did not occur: N/A          Medical Problem List and Plan: 1.  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  Sternal precautions  Dc home today  Will see patient for transitional care management in 1-2 weeks post-discharge  WHO for RUE 2.  Antithrombotics: -DVT/anticoagulation: SCDs.  Acute LUE DVT, U/S personally reviewed, discussed with radiology regarding read.  Discussed with CTS, cont Eliquis   Cr. WNL on 11/19             -antiplatelet therapy: Aspirin 81 mg daily, Plavix 25 mg daily x3 weeks then aspirin alone 3. Pain Management: Neurontin 300 mg twice daily, oxycodone/Ultram as needed, continues to use for sternal pain and shoulder pain  -discussed appropriate rom, stretching for RUE. Needs to be careful how she uses arm not overstress her RTC  -encouraged use of heat  -send home with oxycodone prn, suggested use of tylenol also  4. Mood: Provide emotional support             -antipsychotic agents: N/A 5. Neuropsych: This patient is not fully capable of making decisions on her own behalf. 6. Skin/Wound Care: Routine skin checks 7. Fluids/Electrolytes/Nutrition: Routine in and outs.   8.  Acute blood loss anemia.    Hemoglobin 9.5 on  11/19  Continue to monitor 9.  Hypertension.  Lopressor 25 mg twice daily, Lasix 40 mg  Daily  Controlled on 11/21  Monitor with increased mobility 10.  Diabetes mellitus with peripheral neuropathy with hyperglycemia.  Hemoglobin A1c 7.9.    Glucotrol 10 mg twice daily  Levemir 10 units twice daily, increase to 13 twice daily on 11/17.    Check blood sugars before meals and at bedtime  Improving control. Follow up as outpt 11.  Tobacco abuse.  Continue NicoDerm patch as well as inhalers as directed.  Provide counseling 12.  Hyperlipidemia.  Lipitor 13.  Obesity.  BMI 32.98.  Dietary follow-up.  Encourage weight loss. 14.  Post stroke cognitive deficit             SLP for higher-level cognition 15.  Slow transit constipation  KUB personally reviewed, unremarkable  Bowel meds increased on 11/12  Additional meds given x1 on 11/13, however patient did not take.  Discussed compliance with bowel meds  Improved 16.  Hyponatremia  Sodium 134 on 11/19  Continue to monitor 17.  Hypoalbuminemia  Supplement initiated on 11/13 18. Sleep disturbance  Melatonin started on 11/15  Improved 19.  Thrombocytopenia  Platelets 172 11/20  LOS: 10 days A FACE TO FACE EVALUATION WAS PERFORMED  Meredith Staggers 04/09/2019, 8:40 AM

## 2019-04-09 NOTE — Progress Notes (Signed)
Patient discharged per wheelchair accompanied by NT and husband.. Meds given . Discharge instructions given yesterday no further questions noted.

## 2019-04-12 ENCOUNTER — Telehealth: Payer: Self-pay

## 2019-04-12 NOTE — Telephone Encounter (Signed)
  First attempt at a [place] made today, no answer, left voicemail to return call  Patient Name:  Joan Webb ,DOB: 11/10/1970 Appointment Date and Time: 04-21-2019 / 140 pm With: Dr. Posey Pronto

## 2019-04-21 ENCOUNTER — Other Ambulatory Visit: Payer: Self-pay

## 2019-04-21 ENCOUNTER — Telehealth: Payer: Self-pay | Admitting: *Deleted

## 2019-04-21 ENCOUNTER — Encounter: Payer: Self-pay | Admitting: Physical Medicine & Rehabilitation

## 2019-04-21 ENCOUNTER — Encounter: Payer: Medicaid Other | Attending: Physical Medicine & Rehabilitation | Admitting: Physical Medicine & Rehabilitation

## 2019-04-21 VITALS — BP 110/74 | HR 90 | Temp 97.7°F | Ht 62.0 in | Wt 179.0 lb

## 2019-04-21 DIAGNOSIS — I82A12 Acute embolism and thrombosis of left axillary vein: Secondary | ICD-10-CM

## 2019-04-21 DIAGNOSIS — E1142 Type 2 diabetes mellitus with diabetic polyneuropathy: Secondary | ICD-10-CM | POA: Diagnosis present

## 2019-04-21 DIAGNOSIS — R269 Unspecified abnormalities of gait and mobility: Secondary | ICD-10-CM

## 2019-04-21 DIAGNOSIS — I10 Essential (primary) hypertension: Secondary | ICD-10-CM | POA: Diagnosis present

## 2019-04-21 DIAGNOSIS — E1165 Type 2 diabetes mellitus with hyperglycemia: Secondary | ICD-10-CM

## 2019-04-21 DIAGNOSIS — I639 Cerebral infarction, unspecified: Secondary | ICD-10-CM | POA: Diagnosis present

## 2019-04-21 DIAGNOSIS — Z951 Presence of aortocoronary bypass graft: Secondary | ICD-10-CM | POA: Diagnosis not present

## 2019-04-21 MED ORDER — METHOCARBAMOL 500 MG PO TABS: 500.0000 mg | ORAL_TABLET | Freq: Two times a day (BID) | ORAL | 1 refills | Status: DC | PRN

## 2019-04-21 MED ORDER — FLUOXETINE HCL 10 MG PO CAPS: 10.0000 mg | ORAL_CAPSULE | Freq: Every day | ORAL | 1 refills | Status: DC

## 2019-04-21 NOTE — Telephone Encounter (Signed)
Hoyle Sauer PT from [facility] called to request VO for SN for med management and diabetic teaching and OT for right hand assessment and ADL training.  Approval given.

## 2019-04-21 NOTE — Progress Notes (Signed)
Subjective:    Patient ID: Joan Webb, female    DOB: [birth year], 48 y.o.   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  Admit date: 03/30/2019 Discharge date: 04/09/2019  At discharge, she was instructed to follow up with CTS, with whom she has an appointment. She states she tried to get into free clinic in [facility] and was told to go to [facility]" and states she needs to call. She has an appointment with Neuro. She is taking Eliquis. She is tylenol for pain.  BP is controlled. CBGs have been around ~150. She is having frequent bowel movements. She notes crying spells.  Therapies: 2 more visits Mobility: Walker for long distances DME: Shower chair  Pain Inventory Average Pain 8 Pain Right Now 6 My pain is sharp, tingling and aching  In the last 24 hours, has pain interfered with the following? General activity 5 Relation with others 8 Enjoyment of life 8 What TIME of day is your pain at its worst? night Sleep (in general) Poor  Pain is worse with: walking, bending, sitting, standing and some activites Pain improves with: rest, heat/ice and medication Relief from Meds: 5  Mobility use a walker ability to climb steps?  yes do you drive?  no  Function not employed: date last employed 12-19 I need assistance with the following:  dressing, bathing, toileting, meal prep, household duties and shopping  Neuro/Psych bowel control problems weakness numbness tingling confusion depression anxiety  Prior Studies Any changes since last visit?  no  Physicians involved in your care Any changes since last visit?  no   Family History  Problem Relation Age of Onset  . Heart  attack Father    Social History   Socioeconomic History  . Marital status: Unknown    Spouse name: Not on file  . Number of children: Not on file  . Years of education: Not on file  . Highest education level: Not on file  Occupational History  . Not on file  Social Needs  . Financial resource strain: Not on file  . Food insecurity    Worry: Not on file    Inability: Not on file  . Transportation needs    Medical: Not on file    Non-medical: Not on file  Tobacco Use  . Smoking status: Current Every Day Smoker    Types: Cigarettes  . Smokeless tobacco: Never Used  Substance and Sexual Activity  . Alcohol use: Not Currently  . Drug use: Not Currently  . Sexual activity: Not on file  Lifestyle  . Physical activity    Days per week: Not on file    Minutes per session: Not on file  . Stress: Not on file  Relationships  . Social Musician on phone: Not on file    Gets together: Not on file    Attends religious service: Not on file    Active member of club or organization: Not on file    Attends meetings of clubs or organizations: Not on file    Relationship status: Not on file  Other Topics Concern  . Not on file  Social History Narrative  . Not on file   Past Surgical History:  Procedure Laterality Date  .  CARDIAC CATHETERIZATION    . CORONARY ARTERY BYPASS GRAFT N/A 03/25/2019   Procedure: CORONARY ARTERY BYPASS GRAFTING (CABG) X THREE, ON PUMP, USING LEFT INTERNAL MAMMARY ARTERY AND LEFT GREATER SAPHENOUS VEIN HARVESTED ENDOSCOPICALLY;  Surgeon: Ivin Poot, MD;  Location: [place];  Service: Open Heart Surgery;  Laterality: N/A;  . LEFT HEART CATH AND CORONARY ANGIOGRAPHY N/A 03/23/2019   Procedure: LEFT HEART CATH AND CORONARY ANGIOGRAPHY;  Surgeon: Burnell Blanks, MD;  Location: [place] CV LAB;  Service: Cardiovascular;  Laterality: N/A;  . None    . TEE WITHOUT CARDIOVERSION N/A 03/25/2019   Procedure: TRANSESOPHAGEAL ECHOCARDIOGRAM (TEE);   Surgeon: Prescott Gum, Collier Salina, MD;  Location: [place];  Service: Open Heart Surgery;  Laterality: N/A;   Past Medical History:  Diagnosis Date  . Anxiety   . Coronary artery disease   . Diabetes ([place])   . Hyperlipidemia   . Myocardial infarction ([place])   . Tobacco abuse    BP 110/74   Pulse 90   Temp 97.7 F (36.5 C)   Ht 5\' 2"  (1.575 m)   Wt 179 lb (81.2 kg)   SpO2 97%   BMI 32.74 kg/m   Opioid Risk Score:   Fall Risk Score:  `1  Depression screen PHQ 2/9  No flowsheet data found.   Review of Systems  Constitutional: Positive for unexpected weight change.  HENT: Negative.   Eyes: Negative.   Respiratory: Positive for shortness of breath.   Cardiovascular: Positive for leg swelling.  Gastrointestinal: Positive for diarrhea.  Endocrine: Negative.   Genitourinary: Negative.   Musculoskeletal: Positive for arthralgias and myalgias.  Skin: Negative.   Allergic/Immunologic: Negative.   Neurological: Positive for weakness and numbness.  Hematological: Negative.   Psychiatric/Behavioral: Positive for confusion and dysphoric mood. The patient is nervous/anxious.        Objective:   Physical Exam Constitutional: No distress . Vital signs reviewed. HENT: Normocephalic.  Atraumatic. Eyes: EOMI. No discharge. Cardiovascular: No JVD. Respiratory: Normal effort.  No stridor. GI: Non-distended. Skin: Warm and dry.  Intact. Psych: Normal mood.  Normal behavior. Musc: No edema in extremities.  No tenderness in extremities. 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  1.  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  Sternal precautions             Cont therapies  Follow up with CTS  Follow up with Neuro  Sutures removed  2.  LUE DVT Cont Elquis  3. Antiplatelet therapy:   Cont ASA  4. Pain Management:   Cont meds  Wean Oxycodone  Will order Robaxin 500 BID PRN  5. Reactive depression  Will order Prozaac  6.  Hypertension.    Cont meds  Controlled today  7.  Diabetes mellitus with peripheral neuropathy with hyperglycemia.   Cont meds  Slightly elevated  Follow up with PCP - needs appointment  8.  Tobacco abuse.    Quit tobacco, weaning patch  9.  Slow transit constipation, improved, now with frequent stools  D/c Miralax  10. Sleep disturbance  Cont Melatonin   Improving  11. Gait abnormality  Cont therapies  Cont walker for therapies  Meds reviewed Referrals reviewed - needs appointment with PCP All questions answered

## 2019-04-26 ENCOUNTER — Other Ambulatory Visit: Payer: Self-pay | Admitting: Cardiothoracic Surgery

## 2019-04-26 DIAGNOSIS — Z951 Presence of aortocoronary bypass graft: Secondary | ICD-10-CM

## 2019-04-27 ENCOUNTER — Ambulatory Visit (INDEPENDENT_AMBULATORY_CARE_PROVIDER_SITE_OTHER): Payer: Self-pay | Admitting: Cardiothoracic Surgery

## 2019-04-27 ENCOUNTER — Encounter: Payer: Self-pay | Admitting: Cardiothoracic Surgery

## 2019-04-27 ENCOUNTER — Other Ambulatory Visit: Payer: Self-pay

## 2019-04-27 ENCOUNTER — Ambulatory Visit
Admission: RE | Admit: 2019-04-27 | Discharge: 2019-04-27 | Disposition: A | Discharge status: Home / Self Care | Payer: No Typology Code available for payment source | Source: Ambulatory Visit | Attending: Cardiothoracic Surgery | Admitting: Cardiothoracic Surgery

## 2019-04-27 DIAGNOSIS — I639 Cerebral infarction, unspecified: Secondary | ICD-10-CM | POA: Insufficient documentation

## 2019-04-27 DIAGNOSIS — I63512 Cerebral infarction due to unspecified occlusion or stenosis of left middle cerebral artery: Secondary | ICD-10-CM

## 2019-04-27 DIAGNOSIS — Z951 Presence of aortocoronary bypass graft: Secondary | ICD-10-CM

## 2019-04-27 MED ORDER — GABAPENTIN 300 MG PO CAPS: 300.0000 mg | ORAL_CAPSULE | Freq: Two times a day (BID) | ORAL | 1 refills | Status: DC

## 2019-04-27 MED ORDER — ATORVASTATIN CALCIUM 80 MG PO TABS: 80.0000 mg | ORAL_TABLET | Freq: Every day | ORAL | 1 refills | Status: AC

## 2019-04-27 MED ORDER — FLUOXETINE HCL 10 MG PO CAPS: 10.0000 mg | ORAL_CAPSULE | Freq: Every day | ORAL | 1 refills | Status: DC

## 2019-04-27 MED ORDER — NICOTINE 14 MG/24HR TD PT24: MEDICATED_PATCH | TRANSDERMAL | 0 refills | Status: DC

## 2019-04-27 MED ORDER — GLIPIZIDE 10 MG PO TABS: 10.0000 mg | ORAL_TABLET | Freq: Two times a day (BID) | ORAL | 1 refills | Status: AC

## 2019-04-27 MED ORDER — SITAGLIPTIN PHOS-METFORMIN HCL 50-1000 MG PO TABS: 1.0000 | ORAL_TABLET | Freq: Two times a day (BID) | ORAL | 0 refills | Status: DC

## 2019-04-27 MED ORDER — QC VITAMIN D3 125 MCG (5000 UT) PO TABS: 1.0000 | ORAL_TABLET | Freq: Every day | ORAL | 1 refills | Status: AC

## 2019-04-27 MED ORDER — OXYCODONE HCL 5 MG PO TABS: 5.0000 mg | ORAL_TABLET | Freq: Four times a day (QID) | ORAL | 0 refills | Status: AC | PRN

## 2019-04-27 MED ORDER — APIXABAN 5 MG PO TABS: 5.0000 mg | ORAL_TABLET | Freq: Two times a day (BID) | ORAL | 1 refills | Status: DC

## 2019-04-27 MED ORDER — METOPROLOL TARTRATE 25 MG PO TABS: 25.0000 mg | ORAL_TABLET | Freq: Two times a day (BID) | ORAL | 0 refills | Status: DC

## 2019-04-27 NOTE — Progress Notes (Signed)
PCP is Default, Provider, MD Referring Provider is Burnell Blanks*  Chief Complaint  Patient presents with  . Routine Post Op    s/p CABG x 3 03/25/19    HPI: Scheduled visit 1 month after urgent multivessel CABG. Patient had uncontrolled diabetes, smoking, and positive family history preop. Perioperative left subcortical stroke with weakness of the right hand and arm. Improving with home physical therapy Maintaining sinus rhythm, no symptoms of CHF. Surgical incisions well-healed. No symptoms of recurrent angina.  Some intermittent click in the upper sternal area while doing exertional activity  Patient does not have primary care physician yet waiting for Medicare Medicaid approval. Refills for her discharge medications were sent to [facility] in the   Past Medical History:  Diagnosis Date  . Anxiety   . Coronary artery disease   . Diabetes ([place])   . Hyperlipidemia   . Myocardial infarction ([place])   . Tobacco abuse     Past Surgical History:  Procedure Laterality Date  . CARDIAC CATHETERIZATION    . CORONARY ARTERY BYPASS GRAFT N/A 03/25/2019   Procedure: CORONARY ARTERY BYPASS GRAFTING (CABG) X THREE, ON PUMP, USING LEFT INTERNAL MAMMARY ARTERY AND LEFT GREATER SAPHENOUS VEIN HARVESTED ENDOSCOPICALLY;  Surgeon: Ivin Poot, MD;  Location: [facility];  Service: Open Heart Surgery;  Laterality: N/A;  . LEFT HEART CATH AND CORONARY ANGIOGRAPHY N/A 03/23/2019   Procedure: LEFT HEART CATH AND CORONARY ANGIOGRAPHY;  Surgeon: Burnell Blanks, MD;  Location: [place] [facility];  Service: Cardiovascular;  Laterality: N/A;  . None    . TEE WITHOUT CARDIOVERSION N/A 03/25/2019   Procedure: TRANSESOPHAGEAL ECHOCARDIOGRAM (TEE);  Surgeon: Prescott Gum, Collier Salina, MD;  Location: [place];  Service: Open Heart Surgery;  Laterality: N/A;    Family History  Problem Relation Age of Onset  . Heart attack Father     Social History Social History   Tobacco Use  . Smoking  status: Current Every Day Smoker    Types: Cigarettes  . Smokeless tobacco: Never Used  Substance Use Topics  . Alcohol use: Not Currently  . Drug use: Not Currently    Current Outpatient Medications  Medication Sig Dispense Refill  . acetaminophen (TYLENOL) 325 MG tablet Take 1-2 tablets (325-650 mg total) by mouth every 4 (four) hours as needed for mild pain.    Marland Kitchen apixaban (ELIQUIS) 5 MG TABS tablet Take 1 tablet (5 mg total) by mouth 2 (two) times daily. 60 tablet 1  . aspirin EC 81 MG EC tablet Take 1 tablet (81 mg total) by mouth daily.    Marland Kitchen atorvastatin (LIPITOR) 80 MG tablet Take 1 tablet (80 mg total) by mouth daily at 6 PM. 30 tablet 1  . Cholecalciferol (QC VITAMIN D3) 125 MCG (5000 UT) TABS Take 1 tablet (5,000 Units total) by mouth daily. 30 tablet 1  . FLUoxetine (PROZAC) 10 MG capsule Take 1 capsule (10 mg total) by mouth daily. 30 capsule 1  . gabapentin (NEURONTIN) 300 MG capsule Take 1 capsule (300 mg total) by mouth 2 (two) times daily. 60 capsule 1  . glipiZIDE (GLUCOTROL) 10 MG tablet Take 1 tablet (10 mg total) by mouth 2 (two) times daily before a meal. 60 tablet 1  . metoprolol tartrate (LOPRESSOR) 25 MG tablet Take 1 tablet (25 mg total) by mouth 2 (two) times daily. 60 tablet 0  . nicotine (NICODERM CQ - DOSED IN MG/24 HOURS) 14 mg/24hr patch 14 mg patch daily for 2 weeks then 7  mg patch daily for 3 weeks and stop 28 patch 0  . oxyCODONE (OXY IR/ROXICODONE) 5 MG immediate release tablet Take 1 tablet (5 mg total) by mouth every 6 (six) hours as needed for up to 7 days for severe pain. 12 tablet 0  . sitaGLIPtin-metformin (JANUMET) 50-1000 MG tablet Take 1 tablet by mouth 2 (two) times daily with a meal. 60 tablet 0   No current facility-administered medications for this visit.     No Known Allergies  Review of Systems  Appetite good Sleeping improving Still easily fatigued but improving with time No fever Weight stable Patient is not smoking BP 118/81 (BP  Location: Right Arm, Patient Position: Sitting, Cuff Size: Normal)   Pulse 91   Temp (!) 97.5 F (36.4 C) (Skin)   Resp 20   Ht 5\' 2"  (1.575 m)   Wt 182 lb 4.8 oz (82.7 kg)   SpO2 97% Comment: RA  BMI 33.34 kg/m  Physical Exam      Exam    General- alert and comfortable    Neck- no JVD, no cervical adenopathy palpable, no carotid bruit   Lungs- clear without rales, wheezes   Cor- regular rate and rhythm, no murmur , gallop   Abdomen- soft, non-tender   Extremities - warm, non-tender, minimal edema   Neuro- oriented, appropriate, mild focal weakness of right hand grip. This should improve with time. She needs continued physical therapy.   Diagnostic Tests: Chest x-ray today reviewed and is clear.  Impression: Good but slow early recovery after urgent CABG x3. Main limitation is pain in right arm and weakness in right hand. Continue Neurontin and physical therapy and Eliquis for her perioperative stroke.  Plan: Return with chest x-ray in 6 weeks for review of progress. All discharge medications refilled at her [facility] pharmacy. Patient can lift up to 10 pounds but no more. Patient can drive short distances but  only if necessary.   [facility], MD [facility] [phone]

## 2019-05-05 ENCOUNTER — Telehealth: Payer: Self-pay | Admitting: *Deleted

## 2019-05-05 ENCOUNTER — Telehealth: Payer: Self-pay | Admitting: Cardiovascular Disease

## 2019-05-05 NOTE — Telephone Encounter (Signed)
Pt c/o medication issue:  1. Name of Medication: apixaban (ELIQUIS) 5 MG TABS tablet sitaGLIPtin-metformin (JANUMET) 50-1000 MG tablet 2. How are you currently taking this medication (dosage and times per day)? 1 tablet by mouth twice daily   3. Are you having a reaction (difficulty breathing--STAT)? No  4. What is your medication issue? Patient is calling wanting to know if there's anyway for her to get these two medications at a cheaper price. She is currently not working due to her condition and states she does not have health insurance. She is scheduled for a overdue hospital f/u on 05/25/18 from seeing Dr. Angelena Form in th hospital in November.

## 2019-05-05 NOTE — Telephone Encounter (Signed)
Don OT with [facility] called and said Ms Kloeppel had a missed visit this week and he is asking for a visit x1 next week to make up for it.  Approval given.

## 2019-05-09 NOTE — Telephone Encounter (Signed)
Patient following up on getting assistance with apixaban (ELIQUIS) 5 MG TABS tablet. States that she was advised to reach out to Dr. Angelena Form to see if she can fill out a patient assistance application. Please advise.

## 2019-05-09 NOTE — Telephone Encounter (Signed)
Follow up    Patient following up regarding assistance with Eliquis   Also, requesting samples  Patient calling the office for samples of medication:   1.  What medication and dosage are you requesting samples for? Eliquis  2.  Are you currently out of this medication?

## 2019-05-09 NOTE — Telephone Encounter (Signed)
**Note De-Identified Joan Webb Obfuscation** The pt is almost out of Eliquis and is requesting samples.  I have advised her to contact [employer] pt asst program to ask questions concerning her eligibility to receive asst from them and to ask them to mail her an application.  She is aware to complete the application, obtain the needed documents required by [employer] and to bring,mail or fax all to the office and we will take care of the provide part and fax all to to [employer] pt asst program.  Also, she is advised that we are leaving her 2 bottles of Eliquis 5 mg samples downstairs at the [facility] office located at [facility] in [place] at our Covid screening table for her to pick up.  She verbalized understanding to all of the above.

## 2019-05-10 NOTE — Telephone Encounter (Signed)
Pt was given 2 bottles of Eliquis 5 mg samples, per Jeani Hawking Via, LPN.

## 2019-05-19 ENCOUNTER — Telehealth: Payer: Self-pay | Admitting: *Deleted

## 2019-05-19 NOTE — Telephone Encounter (Signed)
Joan Webb, HHOT, [place] left a message reporting 2 missed visits Dec 29th and Dec 31st due to Covid exposure.  Requests verbal order for 1 more visit next week.  Patient will be discharged as they have met all their goals.  Medical record reviewed. Social work note reviewed.  Verbal orders given per office protocol.

## 2019-05-25 NOTE — Progress Notes (Signed)
Chief Complaint  Patient presents with  . Follow-up    CAD   History of Present Illness: 49 yo female with history of CAD, HLD, DM, postoperative CVA and tobacco abuse here today for cardiac follow up. She was admitted to [facility] 03/23/19 with an acute inferior ST elevation MI. Emergent cardiac cath 03/23/19 with multi-vessel CAD. She underwent 3V CABG (LIMA to LAD, SVG to Diagonal, SVG to PDA) on 03/25/19. Echo 03/24/19 with LVEF=60-65% without significant valve disease. She unfortunately suffered a CVA during the post-operative period and was discharged to inpatient rehab. 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 Plavix was stopped  and she was started on Eliquis. She was discharged home on 04/08/19. She was seen in follow up in the CT surgery office 04/27/19 by Dr. Donata Clay and was doing well.   She is here today for follow up. The patient denies any chest pain, dyspnea, palpitations, lower extremity edema, orthopnea, PND, dizziness, near syncope or syncope. Overall feeling well. Still having occasional discomfort in her chest wall. She still has weakness in her right arm. She has cut back to 1-2 cigarettes per day.   Primary Care Physician: Default, Provider, MD  Past Medical History:  Diagnosis Date  . Anxiety   . Coronary artery disease   . Diabetes (HCC)   . Hyperlipidemia   . Myocardial infarction (HCC)   . Tobacco abuse     Past Surgical History:  Procedure Laterality Date  . CARDIAC CATHETERIZATION    . CORONARY ARTERY BYPASS GRAFT N/A 03/25/2019   Procedure: CORONARY ARTERY BYPASS GRAFTING (CABG) X THREE, ON PUMP, USING LEFT INTERNAL MAMMARY ARTERY AND LEFT GREATER SAPHENOUS VEIN HARVESTED ENDOSCOPICALLY;  Surgeon: Kerin Perna, MD;  Location: [facility] OR;  Service: Open Heart Surgery;  Laterality: N/A;  . LEFT HEART CATH AND CORONARY ANGIOGRAPHY N/A 03/23/2019   Procedure: LEFT HEART CATH AND CORONARY ANGIOGRAPHY;   Surgeon: Kathleene Hazel, MD;  Location: [facility] INVASIVE CV LAB;  Service: Cardiovascular;  Laterality: N/A;  . None    . TEE WITHOUT CARDIOVERSION N/A 03/25/2019   Procedure: TRANSESOPHAGEAL ECHOCARDIOGRAM (TEE);  Surgeon: Donata Clay, Theron Arista, MD;  Location: [facility] OR;  Service: Open Heart Surgery;  Laterality: N/A;    Current Outpatient Medications  Medication Sig Dispense Refill  . acetaminophen (TYLENOL) 325 MG tablet Take 1-2 tablets (325-650 mg total) by mouth every 4 (four) hours as needed for mild pain.    Marland Kitchen apixaban (ELIQUIS) 5 MG TABS tablet Take 1 tablet (5 mg total) by mouth 2 (two) times daily. 60 tablet 1  . aspirin EC 81 MG EC tablet Take 1 tablet (81 mg total) by mouth daily.    Marland Kitchen atorvastatin (LIPITOR) 80 MG tablet Take 1 tablet (80 mg total) by mouth daily at 6 PM. 30 tablet 1  . Cholecalciferol (QC VITAMIN D3) 125 MCG (5000 UT) TABS Take 1 tablet (5,000 Units total) by mouth daily. 30 tablet 1  . FLUoxetine (PROZAC) 10 MG capsule Take 1 capsule (10 mg total) by mouth daily. 30 capsule 1  . gabapentin (NEURONTIN) 300 MG capsule Take 1 capsule (300 mg total) by mouth 2 (two) times daily. 60 capsule 1  . glipiZIDE (GLUCOTROL) 10 MG tablet Take 1 tablet (10 mg total) by mouth 2 (two) times daily before a meal. 60 tablet 1  . metoprolol tartrate (LOPRESSOR) 25 MG tablet Take 1 tablet (25 mg total) by  mouth 2 (two) times daily. 60 tablet 0  . nicotine (NICODERM CQ - DOSED IN MG/24 HOURS) 14 mg/24hr patch 14 mg patch daily for 2 weeks then 7 mg patch daily for 3 weeks and stop 28 patch 0  . sitaGLIPtin-metformin (JANUMET) 50-1000 MG tablet Take 1 tablet by mouth 2 (two) times daily with a meal. 60 tablet 0   No current facility-administered medications for this visit.    No Known Allergies  Social History   Socioeconomic History  . Marital status: Unknown    Spouse name: Not on file  . Number of children: Not on file  . Years of education: Not on file  . Highest education  level: Not on file  Occupational History  . Not on file  Tobacco Use  . Smoking status: Current Every Day Smoker    Types: Cigarettes  . Smokeless tobacco: Never Used  Substance and Sexual Activity  . Alcohol use: Not Currently  . Drug use: Not Currently  . Sexual activity: Not on file  Other Topics Concern  . Not on file  Social History Narrative  . Not on file   Social Determinants of Health   Financial Resource Strain:   . Difficulty of Paying Living Expenses: Not on file  Food Insecurity:   . Worried About Charity fundraiser in the Last Year: Not on file  . Ran Out of Food in the Last Year: Not on file  Transportation Needs:   . Lack of Transportation (Medical): Not on file  . Lack of Transportation (Non-Medical): Not on file  Physical Activity:   . Days of Exercise per Week: Not on file  . Minutes of Exercise per Session: Not on file  Stress:   . Feeling of Stress : Not on file  Social Connections:   . Frequency of Communication with Friends and Family: Not on file  . Frequency of Social Gatherings with Friends and Family: Not on file  . Attends Religious Services: Not on file  . Active Member of Clubs or Organizations: Not on file  . Attends Archivist Meetings: Not on file  . Marital Status: Not on file  Intimate Partner Violence:   . Fear of Current or Ex-Partner: Not on file  . Emotionally Abused: Not on file  . Physically Abused: Not on file  . Sexually Abused: Not on file    Family History  Problem Relation Age of Onset  . Heart attack Father     Review of Systems:  As stated in the HPI and otherwise negative.   BP 118/68   Pulse 82   Ht 5\' 2"  (1.575 m)   Wt 181 lb (82.1 kg)   SpO2 96%   BMI 33.11 kg/m   Physical Examination: General: Well developed, well nourished, NAD  HEENT: OP clear, mucus membranes moist  SKIN: warm, dry. No rashes. Neuro: No focal deficits  Musculoskeletal: Muscle strength 5/5 all ext  Psychiatric: Mood and  affect normal  Neck: No JVD, no carotid bruits, no thyromegaly, no lymphadenopathy.  Lungs:Clear bilaterally, no wheezes, rhonci, crackles Cardiovascular: Regular rate and rhythm. No murmurs, gallops or rubs. Abdomen:Soft. Bowel sounds present. Non-tender.  Extremities: No lower extremity edema. Pulses are 2 + in the bilateral DP/PT.  EKG:  EKG is not ordered today. The ekg ordered today demonstrates   Echo 05/23/18: 1. Left ventricular ejection fraction, by visual estimation, is 60 to 65%. The left ventricle has normal function. There is no left ventricular hypertrophy.  2. Left ventricular diastolic parameters are indeterminate. 3. Global right ventricle has normal systolic function.The right ventricular size is normal. No increase in right ventricular wall thickness. 4. Left atrial size was normal. 5. Right atrial size was normal. 6. The mitral valve is normal in structure. No evidence of mitral valve regurgitation. No evidence of mitral stenosis. 7. The tricuspid valve is normal in structure. Tricuspid valve regurgitation is not demonstrated. 8. The aortic valve is tricuspid. Aortic valve regurgitation is not visualized. Mild aortic valve sclerosis without stenosis. 9. The pulmonic valve was normal in structure. Pulmonic valve regurgitation is not visualized. 10. TR signal is inadequate for assessing pulmonary artery systolic pressure. 11. The inferior vena cava is normal in size with <50% respiratory variability, suggesting right atrial pressure of 8 mmHg.  Cardiac cath 03/23/19: Cath 11/5   Prox RCA lesion is 99% stenosed.  Mid RCA lesion is 95% stenosed.  Dist RCA lesion is 60% stenosed.  1st RPL lesion is 100% stenosed.  1st Mrg lesion is 99% stenosed.  Mid Cx lesion is 70% stenosed.  Mid LAD lesion is 90% stenosed.  1st Diag lesion is 90% stenosed.  There is mild left ventricular systolic dysfunction.  LV end diastolic pressure is normal.  There is no  mitral valve regurgitation.  The left ventricular ejection fraction is 35-45% by visual estimate.  1. Acute inferior STEMI secondary to severe disease in the mid and distal RCA and acute occlusion of a small posterolateral artery.  2. Severe stenosis mid LAD 3. Severe stenosis moderate caliber diagonal branch 4. Severe stenosis first obtuse marginal branch 5. Severe stenosis mid Circumflex 6. Mild to moderate LV systolic dysfunction with inferior wall motion abnormality.   Recent Labs: 03/23/2019: TSH 1.094 03/26/2019: Magnesium 1.8 03/31/2019: ALT 40 04/07/2019: BUN 16; Creatinine, Ser 0.61; Potassium 4.1; Sodium 134 04/08/2019: Hemoglobin 10.2; Platelets 172   Lipid Panel    Component Value Date/Time   CHOL 112 03/28/2019 0408   TRIG 114 03/28/2019 0408   HDL 21 (L) 03/28/2019 0408   CHOLHDL 5.3 03/28/2019 0408   VLDL 23 03/28/2019 0408   LDLCALC 68 03/28/2019 0408     Wt Readings from Last 3 Encounters:  05/26/19 181 lb (82.1 kg)  04/27/19 182 lb 4.8 oz (82.7 kg)  04/21/19 179 lb (81.2 kg)     Other studies Reviewed: Additional studies/ records that were reviewed today include: cath images, echo images, hospital notes   Assessment and Plan:   1. CAD s/p CABG without angina: No chest pain. Continue ASA, statin and beta blocker. She is not on Plavix since being started on Eliquis.   2. DM: She is trying to get onto Medicaid to establish with a primary care doctor. Continue Janumet.   3. Tobacco abuse: She has cut back to 2 cigarettes per day. I have encouraged her to keep working on complete cessation.  She will need to discuss length of Eliquis for upper extremity DVT with Dr. Donata Clay. She will also need follow up in the neurology office.   Current medicines are reviewed at length with the patient today.  The patient does not have concerns regarding medicines.  The following changes have been made:  no change  Labs/ tests ordered today include:  No orders of  the defined types were placed in this encounter.    Disposition:   FU with me in 6 months   Signed, Verne Carrow, MD 05/26/2019 12:51 PM     [facility]  [address], [place], [place]  [address] Phone: [phone]; Fax: [phone]    

## 2019-05-26 ENCOUNTER — Ambulatory Visit (INDEPENDENT_AMBULATORY_CARE_PROVIDER_SITE_OTHER): Payer: Medicaid Other | Admitting: Cardiovascular Disease

## 2019-05-26 ENCOUNTER — Encounter: Payer: Self-pay | Admitting: Physical Medicine & Rehabilitation

## 2019-05-26 ENCOUNTER — Encounter: Payer: Self-pay | Admitting: Cardiovascular Disease

## 2019-05-26 ENCOUNTER — Encounter: Payer: Self-pay | Admitting: Adult Health

## 2019-05-26 ENCOUNTER — Other Ambulatory Visit: Payer: Self-pay

## 2019-05-26 ENCOUNTER — Encounter: Payer: Medicaid Other | Attending: Physical Medicine & Rehabilitation | Admitting: Physical Medicine & Rehabilitation

## 2019-05-26 ENCOUNTER — Ambulatory Visit: Payer: Self-pay | Admitting: Adult Health

## 2019-05-26 VITALS — BP 105/72 | HR 92 | Temp 97.5°F | Ht 62.0 in | Wt 182.2 lb

## 2019-05-26 VITALS — BP 102/70 | HR 80 | Temp 97.5°F | Ht 62.0 in | Wt 179.0 lb

## 2019-05-26 VITALS — BP 118/68 | HR 82 | Ht 62.0 in | Wt 181.0 lb

## 2019-05-26 DIAGNOSIS — Z951 Presence of aortocoronary bypass graft: Secondary | ICD-10-CM | POA: Diagnosis not present

## 2019-05-26 DIAGNOSIS — F329 Major depressive disorder, single episode, unspecified: Secondary | ICD-10-CM

## 2019-05-26 DIAGNOSIS — E1165 Type 2 diabetes mellitus with hyperglycemia: Secondary | ICD-10-CM

## 2019-05-26 DIAGNOSIS — I10 Essential (primary) hypertension: Secondary | ICD-10-CM

## 2019-05-26 DIAGNOSIS — I2510 Atherosclerotic heart disease of native coronary artery without angina pectoris: Secondary | ICD-10-CM

## 2019-05-26 DIAGNOSIS — R269 Unspecified abnormalities of gait and mobility: Secondary | ICD-10-CM | POA: Diagnosis present

## 2019-05-26 DIAGNOSIS — E785 Hyperlipidemia, unspecified: Secondary | ICD-10-CM

## 2019-05-26 DIAGNOSIS — I82A12 Acute embolism and thrombosis of left axillary vein: Secondary | ICD-10-CM

## 2019-05-26 DIAGNOSIS — I639 Cerebral infarction, unspecified: Secondary | ICD-10-CM | POA: Diagnosis present

## 2019-05-26 DIAGNOSIS — R29898 Other symptoms and signs involving the musculoskeletal system: Secondary | ICD-10-CM

## 2019-05-26 DIAGNOSIS — E1142 Type 2 diabetes mellitus with diabetic polyneuropathy: Secondary | ICD-10-CM

## 2019-05-26 DIAGNOSIS — R29818 Other symptoms and signs involving the nervous system: Secondary | ICD-10-CM

## 2019-05-26 MED ORDER — GABAPENTIN 600 MG PO TABS: 600.0000 mg | ORAL_TABLET | Freq: Three times a day (TID) | ORAL | 1 refills | Status: DC

## 2019-05-26 MED ORDER — FLUOXETINE HCL 20 MG PO CAPS: 20.0000 mg | ORAL_CAPSULE | Freq: Every day | ORAL | 3 refills | Status: DC

## 2019-05-26 MED ORDER — BUPROPION HCL ER (XL) 150 MG PO TB24: 150.0000 mg | ORAL_TABLET | Freq: Every day | ORAL | 3 refills | Status: DC

## 2019-05-26 NOTE — Progress Notes (Signed)
Subjective:    Patient ID: Joan Webb, female    DOB: Sep 18, 1970, 49 y.o.   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  Last clinic visit on 04/21/2019.  Since that time, pt saw CTS, notes reviewed - continues with WB restrictions. She saw Cards as well, notes reviewed - on Eliquis. She sees Neuro today. She noticed benefit with Robaxin, but states it is temporary. BP is controlled. She is on Prozaac. She still has not established with PCP and therefore, DM is unchecked. She is wearing the patch, but states she has smoked a couple cigarettes. Bowel movements have improved off of laxatives. Denies falls.   Pain Inventory Average Pain 5 Pain Right Now 6 My pain is dull and aching  In the last 24 hours, has pain interfered with the following? General activity 6 Relation with others 6 Enjoyment of life 8 What TIME of day is your pain at its worst? evening, night  Sleep (in general) Poor  Pain is worse with: walking and standing Pain improves with: rest and medication Relief from Meds: 5  Mobility walk without assistance walk with assistance use a walker ability to climb steps?  yes do you drive?  no  Function not employed: date last employed 12-19 I need assistance with the following:  feeding, dressing, bathing, meal prep, household duties and shopping  Neuro/Psych bowel control problems numbness tingling confusion depression anxiety  Prior Studies Any changes since last visit?  no  Physicians involved in your care Any changes since last visit?  no   Family History  Problem Relation Age of Onset  . Heart attack Father    Social History   Socioeconomic History  . Marital status: Unknown    Spouse name: Not on  file  . Number of children: Not on file  . Years of education: Not on file  . Highest education level: Not on file  Occupational History  . Not on file  Tobacco Use  . Smoking status: Current Every Day Smoker    Types: Cigarettes  . Smokeless tobacco: Never Used  Substance and Sexual Activity  . Alcohol use: Not Currently  . Drug use: Not Currently  . Sexual activity: Not on file  Other Topics Concern  . Not on file  Social History Narrative  . Not on file   Social Determinants of Health   Financial Resource Strain:   . Difficulty of Paying Living Expenses: Not on file  Food Insecurity:   . Worried About Programme researcher, broadcasting/film/video in the Last Year: Not on file  . Ran Out of Food in the Last Year: Not on file  Transportation Needs:   . Lack of Transportation (Medical): Not on file  . Lack of Transportation (Non-Medical): Not on file  Physical Activity:   . Days of Exercise per Week: Not on file  . Minutes of Exercise per Session: Not on file  Stress:   . Feeling of Stress : Not on file  Social Connections:   . Frequency of Communication with Friends and Family: Not on file  . Frequency of Social Gatherings with Friends and Family: Not on file  . Attends Religious Services: Not on file  . Active Member of Clubs or Organizations: Not on file  . Attends Banker Meetings:  Not on file  . Marital Status: Not on file   Past Surgical History:  Procedure Laterality Date  . CARDIAC CATHETERIZATION    . CORONARY ARTERY BYPASS GRAFT N/A 03/25/2019   Procedure: CORONARY ARTERY BYPASS GRAFTING (CABG) X THREE, ON PUMP, USING LEFT INTERNAL MAMMARY ARTERY AND LEFT GREATER SAPHENOUS VEIN HARVESTED ENDOSCOPICALLY;  Surgeon: Ivin Poot, MD;  Location: [facility];  Service: Open Heart Surgery;  Laterality: N/A;  . LEFT HEART CATH AND CORONARY ANGIOGRAPHY N/A 03/23/2019   Procedure: LEFT HEART CATH AND CORONARY ANGIOGRAPHY;  Surgeon: Burnell Blanks, MD;  Location: [place]  CV LAB;  Service: Cardiovascular;  Laterality: N/A;  . None    . TEE WITHOUT CARDIOVERSION N/A 03/25/2019   Procedure: TRANSESOPHAGEAL ECHOCARDIOGRAM (TEE);  Surgeon: Prescott Gum, Collier Salina, MD;  Location: [place];  Service: Open Heart Surgery;  Laterality: N/A;   Past Medical History:  Diagnosis Date  . Anxiety   . Coronary artery disease   . Diabetes ([place])   . Hyperlipidemia   . Myocardial infarction ([place])   . Tobacco abuse    BP 102/70   Pulse 80   Temp (!) 97.5 F (36.4 C)   Ht 5\' 2"  (1.575 m)   Wt 179 lb (81.2 kg)   SpO2 97%   BMI 32.74 kg/m   Opioid Risk Score:   Fall Risk Score:  `1  Depression screen PHQ 2/9  No flowsheet data found.   Review of Systems  Constitutional: Positive for diaphoresis and unexpected weight change.  HENT: Negative.   Eyes: Negative.   Respiratory: Positive for cough and shortness of breath.   Cardiovascular: Positive for leg swelling.  Gastrointestinal: Positive for diarrhea.  Endocrine: Negative.   Genitourinary: Negative.   Musculoskeletal: Positive for arthralgias and myalgias.  Skin: Negative.   Allergic/Immunologic: Negative.   Neurological: Positive for numbness.  Hematological: Negative.   Psychiatric/Behavioral: Positive for confusion and dysphoric mood. The patient is nervous/anxious.        Objective:   Physical Exam Constitutional: No distress . Vital signs reviewed. HENT: Normocephalic.  Atraumatic. Eyes: EOMI. No discharge. Cardiovascular: No JVD. Respiratory: Normal effort.  No stridor. GI: Non-distended. Skin: Warm and dry.  Intact. Psych: Normal mood.  Normal behavior. Musc: No edema in extremities.  No tenderness in extremities. 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  1.  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  Sternal precautions             Continue therapies  Continue to follow up with CTS  Follow up with Neuro, appointment today  2.  LUE DVT Cont Elquis  3. Pain Management:   Cont meds  Wean Oxycodone  Cont Robaxin 500 BID PRN  Will increase Gabapentin to 600 TID for RUE neuropathic pain  4. Reactive depression/PBA  Will increase Prozaac  5.  Diabetes mellitus with peripheral neuropathy with hyperglycemia.   Cont meds  Still has not  established with PCP  6.  Tobacco abuse.    Weaning patch, intermittently smoking  Educated  7. Sleep disturbance  Cont Melatonin   Improving  See #1  Will consider Elavil  8. Gait abnormality  Cont therapies  No assistive device required anymore

## 2019-05-26 NOTE — Patient Instructions (Signed)
Medication Instructions:  No changes *If you need a refill on your cardiac medications before your next appointment, please call your pharmacy*  Lab Work: none If you have labs (blood work) drawn today and your tests are completely normal, you will receive your results only by: . MyChart Message (if you have MyChart) OR . A paper copy in the mail If you have any lab test that is abnormal or we need to change your treatment, we will call you to review the results.  Testing/Procedures: none  Follow-Up: At [facility], you and your health needs are our priority.  As part of our continuing mission to provide you with exceptional heart care, we have created designated Provider Care Teams.  These Care Teams include your primary Cardiologist (physician) and Advanced Practice Providers (APPs -  Physician Assistants and Nurse Practitioners) who all work together to provide you with the care you need, when you need it.  Your next appointment:   12 month(s)  The format for your next appointment:   Either In Person or Virtual  Provider:   You may see Dr. Christopher McAlhany or one of the following Advanced Practice Providers on your designated Care Team:    Dayna Dunn, PA-C  Michele Lenze, PA-C   Other Instructions   

## 2019-05-26 NOTE — Patient Instructions (Addendum)
Continue to follow with Dr. Allena Katz in regards to ongoing depression and medication management -recently increased Prozac and you will follow-up in 4 weeks for further evaluation.  May also consider benefit of therapy/counseling  Referral will be placed to you [facility] sleep clinic for sleep apnea evaluation -you will be called to schedule initial visit  Recommend repeating upper extremity ultrasound in 1 month and if resolved, will consider discontinuing Eliquis at that time  Referral will be placed to outpatient PT/OT -you will be called for initial evaluation  Continue aspirin 81 mg daily and Eliquis (apixaban) daily  and Lipitor for secondary stroke prevention  Continue to follow up with PCP regarding cholesterol and diabetes management   Continue to monitor blood pressure at home  Maintain strict control of hypertension with blood pressure goal below 130/90, diabetes with hemoglobin A1c goal below 6.5% and cholesterol with LDL cholesterol (bad cholesterol) goal below 70 mg/dL. I also advised the patient to eat a healthy diet with plenty of whole grains, cereals, fruits and vegetables, exercise regularly and maintain ideal body weight.  Followup in the future with me in 3 months or call earlier if needed       Thank you for coming to see [place] at [facility]. I hope we have been able to provide you high quality care today.  You may receive a patient satisfaction survey over the next few weeks. We would appreciate your feedback and comments so that we may continue to improve ourselves and the health of our patients.

## 2019-05-26 NOTE — Progress Notes (Signed)
[facility] [address] [place]. [place] [address] [phone]       HOSPITAL FOLLOW UP NOTE  [name] Date of Birth:  [birth year] Medical Record Number:  [id number]   Reason for Referral:  hospital stroke follow up    CHIEF COMPLAINT:  Chief Complaint  Patient presents with  . Hospitalization Follow-up    Alone. Rm 9. Patient mentioned that she is a little forgetful. She also mentioned that she gets very short of breath easily. She would like to know how long she will be on blood thinners.    HPI: Joan Webb being seen today for in office hospital follow-up regarding left subcortical lacunar infarct secondary to small vessel disease on 03/27/2019.  History obtained from patient and chart review. Reviewed all radiology images and labs personally.  Ms. Joan Webb is a 49 y.o. female with history of coronary artery disease status post coronary artery bypass graft postop day 2, anxiety, diabetes, hyperlipidemia, tobacco abuse, admitted in the cardiac ICU after coronary artery bypass grafting on 03/25/2019.  She started complaining of right arm weakness and numbness in both lower extremities 2 days post op.  She was evaluated by Dr. Pearlean Brownie and stroke team with stroke work-up revealing left subcortical lacunar infarct within centrum semiovale/CR as evidenced on CT head secondary to small vessel disease status post cardiac bypass surgery.  Unable to obtain MRI due to postsurgical staples.  CTA neck showed bilateral common carotid, internal carotid and vertebral arteries without significant stenosis and no large vessel occlusion noted on CTA head.  Carotid Dopplers performed on 03/23/2019 showed bilateral ICA 1 to 39% stenosis and VAs antegrade.  2D echo obtained on 03/23/2019 showed EF of 60 to 65% without evidence of PFO.  Recommended DAPT for 3 weeks and aspirin alone once cleared with cardiac surgery.  History of HTN with use of lisinopril 5 mg daily stable with  long-term BP goal normotensive range.  LDL 68 and initiated atorvastatin 80 mg daily.  Uncontrolled DM with A1c 7.9 and recommend close PCP follow-up.  Other stroke risk factors include tobacco use.  EtOH use, obesity, CAD status post CABG this admission but no prior history of stroke.  She was discharged to [facility] for ongoing therapy on 03/30/2019.  During CIR admission, found to have left upper extremity acute DVT after evidence of RUE swelling and pain with findings of left subclavian vein and left axillary vein consistent with acute superficial vein thrombosis involving the left basal vein and left cephalic vein therefore Plavix discontinued and was initiated on Eliquis in addition to aspirin 81 mg daily.  She was eventually discharged home in stable condition with recommendation of home health therapy.  Ms. Joan Webb is a 49 year old female who is being seen today for hospital follow up.  Residual stroke deficits of RUE weakness but does endorse improvement.  She will be completing home health PT next week and is questioning ongoing participation in outpatient therapy.  She states she needs assistance with bathing but is able to now dress herself and use the bathroom by herself.  She is also concerned regarding increased depression and anxiety since hospitalization with PHQ-9 score 22 and GAD-7 score 13.  She was recently seen by Dr. Allena Katz today who increased duloxetine dosage from 10 mg to 20 mg.  She is also in the process of quitting smoking and continued use of nicotine patch.  She does endorse insomnia, napping during the day, snoring and increased fear of not  waking up.  She has not previously underwent sleep evaluation.  She has continued on aspirin 81 mg and apixaban 5 mg twice daily without bleeding or bruising.  Unable to determine if any different provider will be managing Eliquis and DVT but patient declines is being mentioned during prior visits.  Blood pressure today 105/72.  She did have follow-up  with cardiology and cardiothoracic since discharge.   she is questioning possible return to driving as well as intercourse.  Denies new or worsening stroke/TIA symptoms.    ROS:   14 system review of systems performed and negative with exception of depression, anxiety, insomnia, fatigue, pain, weakness  PMH:  Past Medical History:  Diagnosis Date  . Anxiety   . Coronary artery disease   . Diabetes (HCC)   . Hyperlipidemia   . Myocardial infarction (HCC)   . Tobacco abuse     PSH:  Past Surgical History:  Procedure Laterality Date  . CARDIAC CATHETERIZATION    . CORONARY ARTERY BYPASS GRAFT N/A 03/25/2019   Procedure: CORONARY ARTERY BYPASS GRAFTING (CABG) X THREE, ON PUMP, USING LEFT INTERNAL MAMMARY ARTERY AND LEFT GREATER SAPHENOUS VEIN HARVESTED ENDOSCOPICALLY;  Surgeon: Kerin Perna, MD;  Location: [facility] OR;  Service: Open Heart Surgery;  Laterality: N/A;  . LEFT HEART CATH AND CORONARY ANGIOGRAPHY N/A 03/23/2019   Procedure: LEFT HEART CATH AND CORONARY ANGIOGRAPHY;  Surgeon: Kathleene Hazel, MD;  Location: [facility] INVASIVE CV LAB;  Service: Cardiovascular;  Laterality: N/A;  . None    . TEE WITHOUT CARDIOVERSION N/A 03/25/2019   Procedure: TRANSESOPHAGEAL ECHOCARDIOGRAM (TEE);  Surgeon: Donata Clay, Theron Arista, MD;  Location: [facility] OR;  Service: Open Heart Surgery;  Laterality: N/A;    Social History:  Social History   Socioeconomic History  . Marital status: Unknown    Spouse name: Not on file  . Number of children: Not on file  . Years of education: Not on file  . Highest education level: Not on file  Occupational History  . Not on file  Tobacco Use  . Smoking status: Current Every Day Smoker    Types: Cigarettes  . Smokeless tobacco: Never Used  Substance and Sexual Activity  . Alcohol use: Not Currently  . Drug use: Not Currently  . Sexual activity: Not on file  Other Topics Concern  . Not on file  Social History Narrative  . Not on file   Social Determinants  of Health   Financial Resource Strain:   . Difficulty of Paying Living Expenses: Not on file  Food Insecurity:   . Worried About Programme researcher, broadcasting/film/video in the Last Year: Not on file  . Ran Out of Food in the Last Year: Not on file  Transportation Needs:   . Lack of Transportation (Medical): Not on file  . Lack of Transportation (Non-Medical): Not on file  Physical Activity:   . Days of Exercise per Week: Not on file  . Minutes of Exercise per Session: Not on file  Stress:   . Feeling of Stress : Not on file  Social Connections:   . Frequency of Communication with Friends and Family: Not on file  . Frequency of Social Gatherings with Friends and Family: Not on file  . Attends Religious Services: Not on file  . Active Member of Clubs or Organizations: Not on file  . Attends Banker Meetings: Not on file  . Marital Status: Not on file  Intimate Partner Violence:   . Fear of Current  or Ex-Partner: Not on file  . Emotionally Abused: Not on file  . Physically Abused: Not on file  . Sexually Abused: Not on file    Family History:  Family History  Problem Relation Age of Onset  . Heart attack Father     Medications:   Current Outpatient Medications on File Prior to Visit  Medication Sig Dispense Refill  . acetaminophen (TYLENOL) 325 MG tablet Take 1-2 tablets (325-650 mg total) by mouth every 4 (four) hours as needed for mild pain.    Marland Kitchen apixaban (ELIQUIS) 5 MG TABS tablet Take 1 tablet (5 mg total) by mouth 2 (two) times daily. 60 tablet 1  . aspirin EC 81 MG EC tablet Take 1 tablet (81 mg total) by mouth daily.    Marland Kitchen atorvastatin (LIPITOR) 80 MG tablet Take 1 tablet (80 mg total) by mouth daily at 6 PM. 30 tablet 1  . Cholecalciferol (QC VITAMIN D3) 125 MCG (5000 UT) TABS Take 1 tablet (5,000 Units total) by mouth daily. 30 tablet 1  . glipiZIDE (GLUCOTROL) 10 MG tablet Take 1 tablet (10 mg total) by mouth 2 (two) times daily before a meal. 60 tablet 1  . metoprolol  tartrate (LOPRESSOR) 25 MG tablet Take 1 tablet (25 mg total) by mouth 2 (two) times daily. 60 tablet 0  . nicotine (NICODERM CQ - DOSED IN MG/24 HOURS) 14 mg/24hr patch 14 mg patch daily for 2 weeks then 7 mg patch daily for 3 weeks and stop 28 patch 0  . sitaGLIPtin-metformin (JANUMET) 50-1000 MG tablet Take 1 tablet by mouth 2 (two) times daily with a meal. 60 tablet 0   No current facility-administered medications on file prior to visit.    Allergies:  No Known Allergies   Physical Exam  Vitals:   05/26/19 1506  BP: 105/72  Pulse: 92  Temp: (!) 97.5 F (36.4 C)  TempSrc: Oral  Weight: 182 lb 3.2 oz (82.6 kg)  Height: 5\' 2"  (1.575 m)   Body mass index is 33.32 kg/m. No exam data present  Depression screen [facility] 2/9 05/26/2019  Decreased Interest 3  Down, Depressed, Hopeless 2  PHQ - 2 Score 5  Altered sleeping 3  Tired, decreased energy 3  Change in appetite 2  Feeling bad or failure about yourself  2  Trouble concentrating 3  Moving slowly or fidgety/restless 3  Suicidal thoughts 1  PHQ-9 Score 22  Difficult doing work/chores Extremely dIfficult    General: well developed, well nourished,  pleasant middle-age Caucasian female, seated, in no evident distress Head: head normocephalic and atraumatic.   Neck: supple with no carotid or supraclavicular bruits Cardiovascular: regular rate and rhythm, no murmurs Musculoskeletal: no deformity Skin:  no rash/petichiae; surgical scar mid chest healing well without evidence of infection Vascular:  Normal pulses all extremities   Neurologic Exam Mental Status: Awake and fully alert.   Normal speech and language.  Oriented to place and time. Recent and remote memory intact. Attention span, concentration and fund of knowledge appropriate. Mood and affect appropriate.  Cranial Nerves: Fundoscopic exam reveals sharp disc margins. Pupils equal, briskly reactive to light. Extraocular movements full without nystagmus. Visual fields full  to confrontation. Hearing intact. Facial sensation intact. Face, tongue, palate moves normally and symmetrically.  Motor:  RUE: 4/5 deltoid and tricep; weak handgrip and increase tone with difficulty with extension Full strength in all other tested extremities Sensory.: intact to touch , pinprick , position and vibratory sensation.  Coordination: Rapid alternating  movements normal in all extremities except right hand. Finger-to-nose and heel-to-shin performed accurately in all extremities except RUE. Gait and Station: Arises from chair without difficulty. Stance is normal. Gait demonstrates normal stride length and balance Reflexes: 1+ and symmetric. Toes downgoing.     NIHSS  0 Modified Rankin  2    Diagnostic Data (Labs, Imaging, Testing)  CT HEAD WO CONTRAST CT ANGIO HEAD W OR WO CONTRAST CT ANGIO NECK W OR WO CONTRAST 03/27/2019 IMPRESSION: CT head:  Small focus of hypodensity within the left centrum semiovale/corona radiata suspicious for acute/subacute infarct.  CTA neck:  1. The bilateral common carotid, internal carotid and vertebral arteries are patent within the neck without significant stenosis. 2. Sequela of recent median sternotomy. Patchy airspace disease within the bilateral lung apices may reflect postoperative edema. Infection not excluded. Partially imaged bilateral pleural effusions.  CTA head:  1. Motion degraded examination which somewhat limits evaluation of the proximal middle cerebral arteries. 2. No intracranial large vessel occlusion or identified high-grade proximal arterial stenosis.    ECHOCARDIOGRAM 03/23/2019 IMPRESSIONS    1. Left ventricular ejection fraction, by visual estimation, is 60 to 65%. The left ventricle has normal function. There is no left ventricular hypertrophy.  2. Left ventricular diastolic parameters are indeterminate.  3. Global right ventricle has normal systolic function.The right ventricular size is  normal. No increase in right ventricular wall thickness.  4. Left atrial size was normal.  5. Right atrial size was normal.  6. The mitral valve is normal in structure. No evidence of mitral valve regurgitation. No evidence of mitral stenosis.  7. The tricuspid valve is normal in structure. Tricuspid valve regurgitation is not demonstrated.  8. The aortic valve is tricuspid. Aortic valve regurgitation is not visualized. Mild aortic valve sclerosis without stenosis.  9. The pulmonic valve was normal in structure. Pulmonic valve regurgitation is not visualized. 10. TR signal is inadequate for assessing pulmonary artery systolic pressure. 11. The inferior vena cava is normal in size with <50% respiratory variability, suggesting right atrial pressure of 8 mmHg.  VAS [place] UPPER EXTREMITY VENOUS DUPLEX BILATERAL 04/05/2019 Summary: Right: No evidence of thrombosis in the subclavian. Left: Findings consistent with acute deep vein thrombosis involving the left subclavian vein and left axillary vein. Findings consistent with acute superficial vein thrombosis involving the left basilic vein and left cephalic vein.      ASSESSMENT: Joan Webb is a 49 y.o. year old female presented with right arm weakness 2 days postop from CABG procedure on 03/27/2019 with stroke work-up revealing left centrum semiovale/corona radiata acute/subacute infarct secondary to small vessel disease status post cardiac bypass surgery.  She was discharged to CIR for therapy and during admission was found to have LUE DVT and placed on Eliquis.  Vascular risk factors include HTN, HLD, DM, CAD, and tobacco use.  Residual deficits of RUE weakness but does endorse improvement    PLAN:  1. Left centrum semiovale/CR infarct: Continue aspirin 81 mg daily and Eliquis (apixaban) daily  and Lipitor for secondary stroke prevention. Maintain strict control of hypertension with blood pressure goal below 130/90, diabetes with hemoglobin  A1c goal below 6.5% and cholesterol with LDL cholesterol (bad cholesterol) goal below 70 mg/dL.  I also advised the patient to eat a healthy diet with plenty of whole grains, cereals, fruits and vegetables, exercise regularly with at least 30 minutes of continuous activity daily and maintain ideal body weight. 2. HLD: Advised to continue current treatment regimen along with continued follow-up  with PCP for future prescribing and monitoring of lipid panel 3. DMII: Advised to continue to monitor glucose levels at home along with continued follow-up with PCP for management and monitoring 4. Acute LUE DVT: Continue Eliquis and recommend repeating ultrasound in 1 month.  If resolved, will discuss possible discontinuation with cardiology and cardiothoracic for further input 5. Depression, post stroke: Currently being managed by physical medicine rehab.  Question possible PBA as well as depression and anxiety.  Recent increase of Prozac to 20 mg daily.  Discussion regarding possible benefit with use of bupropion as well as therapy which patient is interested in and will discuss this further with Dr. Posey Pronto at follow-up. 6. RUE weakness, post stroke: Referral will be placed to outpatient PT/OT for ongoing treatment and improvement 7. ?  Sleep apnea: Referral placed to [place] [facility] for sleep apnea evaluation 8. In regards to driving and intercourse, she is cleared from a neurological standpoint but advised to follow-up with cardiology and cardiothoracic surgeon for further clearance    Follow up in 3 months or call earlier if needed   Greater than 50% of time during this 45 minute visit was spent on counseling, explanation of diagnosis of left centrum semiovale/CR infarct, reviewing risk factor management of HLD, DM, acute LUE DVT and questionable sleep apnea, discussion regarding poststroke depression and ongoing symptoms with possible future management, discussion regarding potential underlying sleep apnea  and importance of sleep evaluation for treatment, planning of further management along with potential future management, and discussion with patient answering all questions to [place], AGNP-BC  [facility] [address] [place], [place] [address]  Phone [phone] Fax [phone] Note: This document was prepared with digital dictation and possible smart phrase technology. Any transcriptional errors that result from this process are unintentional.

## 2019-05-27 ENCOUNTER — Telehealth: Payer: Self-pay | Admitting: *Deleted

## 2019-05-27 NOTE — Telephone Encounter (Signed)
-----   Message from [name], LPN sent at 06/26/4130 10:21 AM EST ----- Regarding: FW: patient assistance forms [name],  You are correct that Dr [name] is not the prescribing MD and he does not follow her for DVT management. I would refer her to the prescribing MD . Thank you for pointing that out. Thanks, [name] I had a nice Christmas and New Year and I hope you and the rest of your [name]'s did too:)!  ----- Message ----- From: Lendon Ka, RN Sent: 05/26/2019   5:04 PM EST To: [name], LPN Subject: RE: patient assistance forms                   The form is from their website. The thing that i'm wondering about is should Dr. [name] even be the prescriber.  ----- Message ----- From: Demetrios Loll, LPN Sent: 08/20/100   1:34 PM EST To: Lendon Ka, RN Subject: RE: patient assistance forms                   [name], Where did you guys get the [employer] pt asst application from?  We have to have that info so she will need to bring them back to the office and If I send you all of the answers for the provider part of that application can you plug them onto the form, have Dr [name] sign them and fax all into [employer] at [phone]? Thanks, [name] If you cant can you please ask [name] to fill ot the provider page for me. ----- Message ----- From: Lendon Ka, RN Sent: 05/26/2019  12:42 PM EST To: [name], LPN Subject: patient assistance forms                       Hi [name] Happy New Year to you and your family  Dr. [name] saw this patient today.  She requested assistance forms for Eliquis.  We gave them to her.  She completed them.  I had Dr. [name] sign them and was going to have them faxed to you.  Then I realized she is on Eliquis because she had a DVT in her arm.  She also had a stroke after her open heart surgery.    She needs a primary care doctor. Dr. [name] told her to ask Dr. [name] if she should be followed in neurology.  (she is seeing him today too) So no one is really managing the Eliquis and it was ordered by hospitalist and now no one is following her.  Also PS. She brought a copy of her 2019 tax return for proof of income which I was going to get from her and then she left with the envelope....  Can you advise?? Joan Webb

## 2019-05-27 NOTE — Telephone Encounter (Signed)
Called patient re: patient assist. From [employer]. Yesterday she filled out paperwork for Eliquis while seeing Dr. [name].  However the Eliquis is being prescribed/managed by neurology.  I called the patient back to inform her that [facility] will need to be contacted to help her with patient assistance.  She is appreciative for the information provided and will contact them directly.

## 2019-05-30 NOTE — Progress Notes (Signed)
I agree with the above plan 

## 2019-06-04 ENCOUNTER — Telehealth (HOSPITAL_COMMUNITY): Payer: Self-pay | Admitting: *Deleted

## 2019-06-04 NOTE — Telephone Encounter (Signed)
Called patient today to confirm her orientation on Monday 06/06/19. I sent her instructions on where to come and the time of appointment to her in an e-mail per her request. I asked her if she got the e-mail and had a chance to look at it. She stated she did not feel good today and that she has not look at her emails. I asked to call me Monday if she does not feel well and is not going to make the appointment. She agreed that she would.

## 2019-06-06 ENCOUNTER — Telehealth: Payer: Self-pay | Admitting: Adult Health

## 2019-06-06 ENCOUNTER — Ambulatory Visit (HOSPITAL_COMMUNITY): Payer: MEDICAID

## 2019-06-06 DIAGNOSIS — I82A12 Acute embolism and thrombosis of left axillary vein: Secondary | ICD-10-CM

## 2019-06-06 NOTE — Addendum Note (Signed)
Addended by: Raliegh Ip on: 06/06/2019 04:53 PM   Modules accepted: Orders

## 2019-06-06 NOTE — Telephone Encounter (Signed)
Can you re-order please [id number] . Patient will be a self pay Insurance pending .

## 2019-06-10 ENCOUNTER — Ambulatory Visit (HOSPITAL_COMMUNITY)
Admission: RE | Admit: 2019-06-10 | Discharge: 2019-06-10 | Disposition: A | Discharge status: Home / Self Care | Payer: Medicaid Other | Source: Ambulatory Visit | Attending: Adult Health | Admitting: Adult Health

## 2019-06-10 ENCOUNTER — Other Ambulatory Visit: Payer: Self-pay

## 2019-06-10 DIAGNOSIS — I82A12 Acute embolism and thrombosis of left axillary vein: Secondary | ICD-10-CM | POA: Insufficient documentation

## 2019-06-10 NOTE — Progress Notes (Signed)
Bilateral upper extremity venous duplex completed.  Preliminary results can be found under CV proc under chart review.  06/10/2019 11:30 AM  Michaelia Beilfuss, K., RDMS, RVT

## 2019-06-13 ENCOUNTER — Ambulatory Visit (HOSPITAL_COMMUNITY): Payer: Self-pay | Admitting: Physical Therapy

## 2019-06-13 ENCOUNTER — Telehealth (HOSPITAL_COMMUNITY): Payer: Self-pay | Admitting: Physical Therapy

## 2019-06-13 ENCOUNTER — Ambulatory Visit (HOSPITAL_COMMUNITY): Payer: Self-pay

## 2019-06-13 NOTE — Telephone Encounter (Signed)
She did not sleep all night and wants to cx - waiting on NP to sign referral for PT and will r/s after we receive that PT ordrer.

## 2019-06-14 ENCOUNTER — Other Ambulatory Visit: Payer: Self-pay | Admitting: Cardiothoracic Surgery

## 2019-06-14 DIAGNOSIS — Z951 Presence of aortocoronary bypass graft: Secondary | ICD-10-CM

## 2019-06-15 ENCOUNTER — Ambulatory Visit
Admission: RE | Admit: 2019-06-15 | Discharge: 2019-06-15 | Disposition: A | Discharge status: Home / Self Care | Payer: Self-pay | Source: Ambulatory Visit | Attending: Cardiothoracic Surgery | Admitting: Cardiothoracic Surgery

## 2019-06-15 ENCOUNTER — Ambulatory Visit (INDEPENDENT_AMBULATORY_CARE_PROVIDER_SITE_OTHER): Payer: Self-pay | Admitting: Cardiothoracic Surgery

## 2019-06-15 ENCOUNTER — Other Ambulatory Visit: Payer: Self-pay

## 2019-06-15 ENCOUNTER — Encounter: Payer: Self-pay | Admitting: Cardiothoracic Surgery

## 2019-06-15 VITALS — BP 103/66 | HR 85 | Resp 20 | Ht 62.0 in | Wt 180.0 lb

## 2019-06-15 DIAGNOSIS — I2510 Atherosclerotic heart disease of native coronary artery without angina pectoris: Secondary | ICD-10-CM

## 2019-06-15 DIAGNOSIS — I63512 Cerebral infarction due to unspecified occlusion or stenosis of left middle cerebral artery: Secondary | ICD-10-CM

## 2019-06-15 DIAGNOSIS — Z951 Presence of aortocoronary bypass graft: Secondary | ICD-10-CM

## 2019-06-15 MED ORDER — VARENICLINE TARTRATE 0.5 MG PO TABS: 0.5000 mg | ORAL_TABLET | Freq: Two times a day (BID) | ORAL | 0 refills | Status: AC

## 2019-06-15 MED ORDER — TEMAZEPAM 15 MG PO CAPS: 15.0000 mg | ORAL_CAPSULE | Freq: Every evening | ORAL | 0 refills | Status: DC | PRN

## 2019-06-15 MED ORDER — CLOPIDOGREL BISULFATE 75 MG PO TABS: 75.0000 mg | ORAL_TABLET | Freq: Every day | ORAL | 2 refills | Status: DC

## 2019-06-15 NOTE — Progress Notes (Signed)
PCP is Patient, No Pcp Per Referring Provider is Kathleene Hazel*  Chief Complaint  Patient presents with  . Routine Post Op    2 month f/u with CXR    HPI: The patient returns for surgical follow-up almost 3 months after urgent CABG x3.  She is a diabetic smoker who presented with a non-STEMI and severe CAD.  Her preoperative EF was 45% but improved following revascularization by TEE.  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  She developed DVT in her left arm and was discharged home on Eliquis-ultrasound now shows the arm DVT has resolved.  She has had no recurrent symptoms of angina or symptoms of CHF.  Surgical incisions are well-healed.  Her chest x-ray today is clear.  She has maintained a sinus rhythm.  Her main complaints are related to her stroke and diabetic neuropathy with hypersensitivity and burning of the chest incision, neuropathic pain of both her hands right greater than left, and pain in her right shoulder with elevation of the arm.  She also complains of severe insomnia and the urge to resume smoking--unfortunately she has started 1 to 2 cigarettes daily.  The patient was recently seen by her cardiologist who is recommended that she start Plavix now that the Eliquis will be stopped since the DVT has resolved.  Patient has been seen by neurology and they have prescribed increased dose of gabapentin for her neuropathies and further physical therapy.    Past Medical History:  Diagnosis Date  . Anxiety   . Coronary artery disease   . Diabetes (HCC)   . Hyperlipidemia   . Myocardial infarction (HCC)   . Tobacco abuse     Past Surgical History:  Procedure Laterality Date  . CARDIAC CATHETERIZATION    . CORONARY ARTERY BYPASS GRAFT N/A 03/25/2019   Procedure: CORONARY ARTERY BYPASS GRAFTING (CABG) X THREE, ON PUMP, USING LEFT INTERNAL MAMMARY ARTERY AND LEFT GREATER  SAPHENOUS VEIN HARVESTED ENDOSCOPICALLY;  Surgeon: Kerin Perna, MD;  Location: [facility] OR;  Service: Open Heart Surgery;  Laterality: N/A;  . LEFT HEART CATH AND CORONARY ANGIOGRAPHY N/A 03/23/2019   Procedure: LEFT HEART CATH AND CORONARY ANGIOGRAPHY;  Surgeon: Kathleene Hazel, MD;  Location: [facility];  Service: Cardiovascular;  Laterality: N/A;  . None    . TEE WITHOUT CARDIOVERSION N/A 03/25/2019   Procedure: TRANSESOPHAGEAL ECHOCARDIOGRAM (TEE);  Surgeon: Donata Clay, Theron Arista, MD;  Location: [facility] OR;  Service: Open Heart Surgery;  Laterality: N/A;    Family History  Problem Relation Age of Onset  . Heart attack Father     Social History Social History   Tobacco Use  . Smoking status: Current Every Day Smoker    Types: Cigarettes  . Smokeless tobacco: Never Used  Substance Use Topics  . Alcohol use: Not Currently  . Drug use: Not Currently    Current Outpatient Medications  Medication Sig Dispense Refill  . acetaminophen (TYLENOL) 325 MG tablet Take 1-2 tablets (325-650 mg total) by mouth every 4 (four) hours as needed for mild pain.    Marland Kitchen aspirin EC 81 MG EC tablet Take 1 tablet (81 mg total) by mouth daily.    Marland Kitchen atorvastatin (LIPITOR) 80 MG tablet Take 1 tablet (80 mg total) by mouth daily at 6 PM. 30 tablet 1  . Cholecalciferol (QC VITAMIN D3) 125 MCG (5000 UT) TABS Take 1 tablet (5,000 Units total)  by mouth daily. 30 tablet 1  . FLUoxetine (PROZAC) 20 MG capsule Take 1 capsule (20 mg total) by mouth daily. 90 capsule 3  . gabapentin (NEURONTIN) 600 MG tablet Take 1 tablet (600 mg total) by mouth 3 (three) times daily. 90 tablet 1  . glipiZIDE (GLUCOTROL) 10 MG tablet Take 1 tablet (10 mg total) by mouth 2 (two) times daily before a meal. 60 tablet 1  . metoprolol tartrate (LOPRESSOR) 25 MG tablet Take 1 tablet (25 mg total) by mouth 2 (two) times daily. 60 tablet 0  . nicotine (NICODERM CQ - DOSED IN MG/24 HOURS) 14 mg/24hr patch 14 mg patch daily for 2 weeks then 7  mg patch daily for 3 weeks and stop 28 patch 0  . sitaGLIPtin-metformin (JANUMET) 50-1000 MG tablet Take 1 tablet by mouth 2 (two) times daily with a meal. 60 tablet 0  . clopidogrel (PLAVIX) 75 MG tablet Take 1 tablet (75 mg total) by mouth daily. 30 tablet 2  . temazepam (RESTORIL) 15 MG capsule Take 1 capsule (15 mg total) by mouth at bedtime as needed for sleep. 30 capsule 0  . varenicline (CHANTIX) 0.5 MG tablet Take 1 tablet (0.5 mg total) by mouth 2 (two) times daily. 60 tablet 0   No current facility-administered medications for this visit.    No Known Allergies  Review of Systems  She has gained weight over the past 6 weeks She has been very active with increase strength walking her horses at least 2 miles a day She seems committed towards smoking cessation and improved diet and a heart healthy lifestyle.  She states at one point in her life she weighed 300 pounds.  BP 103/66   Pulse 85   Resp 20   Ht 5\' 2"  (1.575 m)   Wt 180 lb (81.6 kg)   SpO2 94% Comment: RA  BMI 32.92 kg/m  Physical Exam      Exam    General- alert and comfortable.  Sternal incision well-healed.    Neck- no JVD, no cervical adenopathy palpable, no carotid bruit   Lungs- clear without rales, wheezes   Cor- regular rate and rhythm, no murmur , gallop   Abdomen- soft, non-tender   Extremities - warm, non-tender, minimal edema   Neuro- oriented, appropriate, mild right hand focal weakness   Diagnostic Tests: Chest x-ray performed today-images personally reviewed.  Lungs are clear.  Sternal wires intact.  Normal cardiac silhouette.  No pleural effusion.  Impression: Patient making excellent progress after urgent  three-vessel CABG with perioperative left brain small vessel disease CVA.  Plan: She will stop the Eliquis for her arm DVT and start Plavix 75 mg daily for her coronary disease and small vessel cerebrovascular disease.  Continue aspirin 81 mg  I have given the patient a 30-day supply of  Restoril to help break her insomnia cycle.  Her over-the-counter meds have not worked I discussed the serious negative effects of resuming smoking including further stroke risk and heart disease risk.  I will provide her a 30-day course of Chantix.  The patient return for follow-up in 90 days to assess progress.  She understands importance of heart healthy lifestyle including daily aerobic activity -walking 30 minutes, smoking cessation, and heart healthy diet.  Len Childs, MD [facility] [phone]

## 2019-06-16 ENCOUNTER — Ambulatory Visit (HOSPITAL_COMMUNITY): Payer: Medicaid Other | Attending: Adult Health | Admitting: Occupational Therapy

## 2019-06-16 ENCOUNTER — Encounter (HOSPITAL_COMMUNITY): Payer: Self-pay | Admitting: Occupational Therapy

## 2019-06-16 DIAGNOSIS — R278 Other lack of coordination: Secondary | ICD-10-CM | POA: Diagnosis present

## 2019-06-16 DIAGNOSIS — I69351 Hemiplegia and hemiparesis following cerebral infarction affecting right dominant side: Secondary | ICD-10-CM | POA: Diagnosis not present

## 2019-06-16 DIAGNOSIS — R29898 Other symptoms and signs involving the musculoskeletal system: Secondary | ICD-10-CM | POA: Diagnosis present

## 2019-06-16 NOTE — Patient Instructions (Signed)
Fine Motor & Hand Exercises. Complete 3x/day, 10X each  1) Towel crunch Place a small towel on a firm table top. Flatten out the towel and then place your hand on one end of it.  Next, flex your fingers 2-5 (index finger through pinky finger) as you pull the towel towards your hand.    2) Digit composite flexion/adduction (make a fist) Hold your hand up as shown. Open and close your hand into a fist and repeat. If you cannot make a full fist, then make a partial fist.    3) Thumb/finger opposition Touch the tip of the thumb to each fingertip one by one. Extend fingers fully after they are touched.      4) Finger Taps Start with the hand flat and fingers slightly spread.  One at a time, starting with the thumb, lift each finger up separately.      5) Digit Abduction/Adduction Hold hand palm down flat on table. Spread your fingers apart and back together.

## 2019-06-16 NOTE — Therapy (Addendum)
[place], [place], [address] Phone: [phone]   Fax:  [phone]  Occupational Therapy Evaluation  Patient Details  Name: Joan Webb MRN: [id number] Date of Birth: 24-Oct-1970 Referring Provider (OT): Brayton Layman, NP   Encounter Date: 06/16/2019  OT End of Session - 06/16/19 1704    Visit Number  1    Number of Visits  12    Date for OT Re-Evaluation  07/28/19   mini reassessment 07/13/2019   Authorization Type  Self-pay; Medicaid pending    Authorization Time Period  none will be requested until Medicaid is approved    OT Start Time  1540   pt arrived late   OT Stop Time  1602    OT Time Calculation (min)  32 min    Activity Tolerance  Patient tolerated treatment well    Behavior During Therapy  [facility] for tasks assessed/performed       Past Medical History:  Diagnosis Date  . Anxiety   . Coronary artery disease   . Diabetes ([place])   . Hyperlipidemia   . Myocardial infarction ([place])   . Tobacco abuse     Past Surgical History:  Procedure Laterality Date  . CARDIAC CATHETERIZATION    . CORONARY ARTERY BYPASS GRAFT N/A 03/25/2019   Procedure: CORONARY ARTERY BYPASS GRAFTING (CABG) X THREE, ON PUMP, USING LEFT INTERNAL MAMMARY ARTERY AND LEFT GREATER SAPHENOUS VEIN HARVESTED ENDOSCOPICALLY;  Surgeon: Ivin Poot, MD;  Location: [place];  Service: Open Heart Surgery;  Laterality: N/A;  . LEFT HEART CATH AND CORONARY ANGIOGRAPHY N/A 03/23/2019   Procedure: LEFT HEART CATH AND CORONARY ANGIOGRAPHY;  Surgeon: Burnell Blanks, MD;  Location: [place] CV LAB;  Service: Cardiovascular;  Laterality: N/A;  . None    . TEE WITHOUT CARDIOVERSION N/A 03/25/2019   Procedure: TRANSESOPHAGEAL ECHOCARDIOGRAM (TEE);  Surgeon: Prescott Gum, Collier Salina, MD;  Location: [place];  Service: Open Heart Surgery;  Laterality: N/A;    There were no vitals filed for this visit.  Subjective Assessment - 06/16/19 1700     Subjective   S: My hands ache and burn a lot at night.    Pertinent History  Pt is a 49 y/o female s/p CABG on 03/24/20 and CVA on 03/26/2020 presenting for evaluation of RUE weakness. Pt reports OT services at [facility] and [facility] ending approximately 2 weeks ago. Pt was referred to occupational therapy for evaluation and treatment by Frann Rider, NP.    Patient Stated Goals  To be able to use my arm more.    Currently in Pain?  Yes    Pain Score  6     Pain Location  Hand    Pain Orientation  Right    Pain Descriptors / Indicators  Burning    Pain Type  Chronic pain    Pain Radiating Towards  N/A    Pain Onset  More than a month ago    Pain Frequency  Intermittent    Aggravating Factors   unsure    Pain Relieving Factors  wearing glove    Effect of Pain on Daily Activities  Min/mod effect on ADLs    Multiple Pain Sites  No        [facility] OT Assessment - 06/16/19 1532      Assessment   Medical Diagnosis  RUE weakness s/p CVA    Referring Provider (OT)  Brayton Layman, NP    Onset Date/Surgical Date  03/28/20    Hand Dominance  Right    Next MD Visit  unsure    Prior Therapy  CIR at [facility], [place]      Precautions   Precautions  Other (comment)    Precaution Comments  s/p CABG on 03/24/2020      Restrictions   Weight Bearing Restrictions  No      Balance Screen   Has the patient fallen in the past 6 months  No      Prior Function   Level of Independence  Independent    Vocation  Unemployed    Leisure  riding horses      ADL   ADL comments  Pt is having difficulty with dressing, bathing, reaching up overhead, grasping and holding objects. Sleeping is difficult due to pain in hands from neuropathy. Pt is having difficulty with feeding herself.       Written Expression   Dominant Hand  Right      Cognition   Overall Cognitive Status  Within Functional Limits for tasks assessed      Sensation   Additional Comments  Test next session      Coordination   9 Hole Peg Test  Right;Left     Right 9 Hole Peg Test  1'45"    Left 9 Hole Peg Test  27.17"      ROM / Strength   AROM / PROM / Strength  AROM;Strength      AROM   Overall AROM Comments  Assessed seated, er/IR adducted. P/ROM for elbow/wrist is WNL    AROM Assessment Site  Shoulder    Right/Left Shoulder  Right    Right Shoulder Flexion  65 Degrees    Right Shoulder ABduction  44 Degrees    Right Shoulder Internal Rotation  90 Degrees    Right Shoulder External Rotation  34 Degrees      Strength   Strength Assessment Site  Shoulder;Elbow;Forearm;Wrist;Hand    Right/Left Shoulder  Right    Right Shoulder Flexion  3-/5    Right Shoulder ABduction  3-/5    Right Shoulder Internal Rotation  4-/5    Right Shoulder External Rotation  3/5    Right/Left Elbow  Right    Right Elbow Flexion  4-/5    Right Elbow Extension  3-/5    Right/Left Forearm  Right    Right Forearm Pronation  4-/5    Right Forearm Supination  3/5    Right/Left Wrist  Right    Right Wrist Flexion  3+/5    Right Wrist Extension  3/5    Right Wrist Radial Deviation  3/5    Right Wrist Ulnar Deviation  3/5    Right/Left hand  Right;Left    Right Hand Gross Grasp  Functional    Right Hand Grip (lbs)  1    Right Hand Lateral Pinch  3 lbs    Right Hand 3 Point Pinch  4 lbs    Left Hand Gross Grasp  Functional    Left Hand Grip (lbs)  55    Left Hand Lateral Pinch  14 lbs    Left Hand 3 Point Pinch  15 lbs                      OT Education - 06/16/19 1552    Education Details  finger/hand A/ROM and fine motor exercises    Person(s) Educated  Patient    Methods  Explanation;Demonstration;Handout  Comprehension  Verbalized understanding;Returned demonstration       OT Short Term Goals - 06/16/19 1712      OT SHORT TERM GOAL #1   Title  Pt will be provided with and educated on HEP to improve use of RUE during ADL completion.    Time  3    Period  Weeks    Status  New    Target Date  07/09/19      OT SHORT TERM  GOAL #2   Title  Pt will increase RUE strength to 4-/5 to improve ability to use RUE during dressing and bathing tasks.    Time  3    Period  Weeks    Status  New      OT SHORT TERM GOAL #3   Title  Pt will increase RUE A/ROM to [facility] to improve ability to reach items in overhead cabinets.    Time  3    Period  Weeks    Status  New      OT SHORT TERM GOAL #4   Title  Pt will increase right grip strength by 10# and pinch strength by 3# to improve ability to hold eating utensils and feed herself.    Time  3    Period  Weeks    Status  New      OT SHORT TERM GOAL #5   Title  Pt will increase right fine motor coordination by completing 9 hole peg test in under 1'10" to improve ability to perform grooming tasks such as brushing teeth or hair using RUE.    Time  3    Period  Weeks    Status  New        OT Long Term Goals - 06/16/19 1713      OT LONG TERM GOAL #1   Title  Pt will increase RUE strength to 4/5 or greater to improve ability to lift and support grandchild.    Time  6    Period  Weeks    Status  New    Target Date  07/28/19      OT LONG TERM GOAL #2   Title  Pt will increase right grip strength by 25# and pinch strength by 5# to improve ability to grasp and maintain hold on items.    Time  6    Period  Weeks    Status  New      OT LONG TERM GOAL #3   Title  Pt will increase RUE fine motor coordination by completing 9 hole peg test in 50" or less to improve ability to tie shoes.    Time  6    Period  Weeks    Status  New      OT LONG TERM GOAL #4   Title  Pt will be educated on pain management techniques for neuropathy in her hands to improve ability to sleep at night.    Time  6    Period  Weeks    Status  New            Plan - 06/16/19 1706    Clinical Impression Statement  A: Pt is a 49 y/o female presenting with RUE weakness s/p CVA on 03/26/2020 during hospital stay secondary to CABG sx. Pt reports improvement in RUE since this time, however reports  she is unable to use the RUE for daily tasks due to weakness and inability to grip items. Pt inconsistent during strength  testing, unable to test sensation due to time constraints (pt arrived late) however pt reporting numbness in right palm. Pt has Medicaid pending and does not want to self-pay, therefore appointments will not be scheduled until after Medicaid is approved.    OT Occupational Profile and History  Problem Focused Assessment - Including review of records relating to presenting problem    Occupational performance deficits (Please refer to evaluation for details):  ADL's;IADL's;Rest and Sleep;Work;Leisure    Body Structure / Function / Physical Skills  ADL;Endurance;UE functional use;Fascial restriction;Pain;FMC;ROM;IADL;Strength    Rehab Potential  Good    Clinical Decision Making  Several treatment options, min-mod task modification necessary    Comorbidities Affecting Occupational Performance:  May have comorbidities impacting occupational performance    Modification or Assistance to Complete Evaluation   No modification of tasks or assist necessary to complete eval    OT Frequency  2x / week    OT Duration  6 weeks    OT Treatment/Interventions  Self-care/ADL training;Ultrasound;Patient/family education;Passive range of motion;Paraffin;Cryotherapy;Electrical Stimulation;Splinting;Moist Heat;Therapeutic exercise;Manual Therapy;Therapeutic activities;Neuromuscular education    Plan  P: Pt will benefit from skilled OT services to improve RUE functioning during daily tasks, increase strength, increase grip and pinch strength, and improve coordination. Treatment plan: General RUE strengthening, grip and pinch strengthening, fine motor coordination tasks, weight-bearing, modalities prn    Consulted and Agree with Plan of Care  Patient       Patient will benefit from skilled therapeutic intervention in order to improve the following deficits and impairments:   Body Structure / Function /  Physical Skills: ADL, Endurance, UE functional use, Fascial restriction, Pain, FMC, ROM, IADL, Strength       Visit Diagnosis: Hemiplegia and hemiparesis following cerebral infarction affecting right dominant side (HCC)  Other lack of coordination  Other symptoms and signs involving the musculoskeletal system    Problem List Patient Active Problem List   Diagnosis Date Noted  . Stroke ([place]) 04/27/2019  . Abnormality of gait 04/21/2019  . Thrombocytopenia ([place])   . Acute deep vein thrombosis (DVT) of axillary vein of left upper extremity ([place])   . Swelling   . Sleep disturbance   . Uncontrolled type 2 diabetes mellitus with hyperglycemia ([place])   . Hypoalbuminemia due to protein-calorie malnutrition ([place])   . Hyponatremia   . Labile blood glucose   . Spasticity   . Constipation   . Subcortical infarction ([place]) 03/30/2019  . Acute cerebral infarction ([place])   . Acute blood loss anemia   . Class 1 obesity due to excess calories with serious comorbidity and body mass index (BMI) of 32.0 to 32.9 in adult   . Cognitive deficit, post-stroke   . Coronary artery disease involving native heart without angina pectoris   . Diabetic peripheral neuropathy ([place])   . Essential hypertension   . 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 Acute ST elevation myocardial infarction (STEMI) of inferior wall ([place]) 03/23/2019  . ST elevation myocardial infarction (STEMI) of inferior wall [facility])    Guadelupe Sabin, OTR/L  [phone] 06/16/2019, 5:18 PM  [place] [address] [place], [place], [address] Phone: [phone]   Fax:  [phone]  Name: Corry Storie MRN: [id number] Date of Birth: 1971-05-18    OCCUPATIONAL THERAPY DISCHARGE SUMMARY  Visits from Start of Care: 1  Current functional level related to goals / functional outcomes: Unknown. Pt did not return for visits after evaluation.   Remaining deficits: Unknown   Education /  Equipment: HEP   Plan: Patient agrees to discharge.  Patient goals were not met. Patient is being discharged due to not returning since the last visit.  ?????

## 2019-06-19 ENCOUNTER — Other Ambulatory Visit: Payer: Self-pay | Admitting: Cardiothoracic Surgery

## 2019-06-23 NOTE — Telephone Encounter (Signed)
Message from patient

## 2019-06-28 ENCOUNTER — Encounter: Payer: Self-pay | Admitting: Neurology

## 2019-06-28 ENCOUNTER — Other Ambulatory Visit: Payer: Self-pay

## 2019-06-28 ENCOUNTER — Ambulatory Visit: Payer: Self-pay | Admitting: Neurology

## 2019-06-28 VITALS — BP 104/72 | HR 101 | Temp 97.3°F | Ht 62.0 in | Wt 177.0 lb

## 2019-06-28 DIAGNOSIS — Z9189 Other specified personal risk factors, not elsewhere classified: Secondary | ICD-10-CM

## 2019-06-28 DIAGNOSIS — I639 Cerebral infarction, unspecified: Secondary | ICD-10-CM

## 2019-06-28 DIAGNOSIS — Z951 Presence of aortocoronary bypass graft: Secondary | ICD-10-CM

## 2019-06-28 DIAGNOSIS — J449 Chronic obstructive pulmonary disease, unspecified: Secondary | ICD-10-CM

## 2019-06-28 DIAGNOSIS — I2119 ST elevation (STEMI) myocardial infarction involving other coronary artery of inferior wall: Secondary | ICD-10-CM

## 2019-06-28 DIAGNOSIS — I82A21 Chronic embolism and thrombosis of right axillary vein: Secondary | ICD-10-CM

## 2019-06-28 NOTE — Progress Notes (Signed)
SLEEP MEDICINE CLINIC    Provider:  Melvyn Novas, MD  Primary Care Physician:  Stroke Team-   Referring Provider: Ihor Austin, [address] [place],  [place] [address]           Chief Complaint according to patient   Patient presents with:    . New Patient (Initial Visit)     rm 10. pt presents today, recently suffered a heart attach and stroke. in hospital RN  witnessed apnea., snores. 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       HISTORY OF PRESENT ILLNESS:  Joan Webb is a 49 y.o. year old Caucasian female patient seen here upon a referral from the stroke team on 06/28/2019  Chief concern according to patient : Joan Webb is a 49 year old Caucasian female with a history of recently diagnosed coronary artery disease status post coronary artery bypass graft.  She was admitted to the cardiac ICU after a grafting procedure on 11-6- 2020, and was told she had oxygen needs, SOB and witnessed apnea by hospital RNs.  She just can't sleep- and had anxiety and depression.    She had started complaining there in the recovery of right arm wea and numbness in both lower extremities on 2 days postop.  The stroke team and Dr. Lubertha South evaluate the patient was a stroke work-up, revealing a left subcortical lacunar infarct with centrum semiovale.  There was also evidence of secondary small vessel disease.  Unable to obtain an MRI at the time due to the postsurgical staples a CT angiogram of the neck and head was performed and showed no significant stenosis.  Carotid Doppler studies from 4 November showed bilateral ICA exceeding not 39% stenosis, the vertebral arteries of both had antegrade flow.  A 2D echocardiogram was performed the same day showing an ejection fraction of 60 to 65% without any evidence of a persistent foraminal ovale.  Prior to her surgery she had a history of hypertension hyperlipidemia was stable on  lisinopril 5 mg daily daily atorvastatin 80 mg daily but diabetes was considered uncontrolled with an HbA1c of 7.9.  It was recommended that she closely follows up with her primary care physician but she does not have 1.  She also has a risk factor of tobacco use, occasional alcohol use, obesity, she was discharged on 29 March 2021 rehab where a left upper extremity DVT was found.  This deep venous thrombosis of the affected the right upper extremity with swelling pain Plavix had to be discontinued and she was initiated on Eliquis.  She was finally discharged home she was considered in stable condition but she still needed home health and physical therapy.  A transesophageal echo had been ordered but has not been performed yet.  Her cardiac surgeon was Dr. Theron Arista felt like, I reviewed her medication list her family history which is positive for coronary artery disease in her father.  She does have a high depression score struggles with anxiety and depression. with a longstanding insomnia  sleep disorder and   has a past medical history of Anxiety, Coronary artery disease, Diabetes (HCC), Hyperlipidemia, Myocardial infarction (HCC), and Tobacco abuse.  Shift work sleep disorder.      Family medical /sleep history: no other family member on CPAP with OSA, insomnia, sleep walkers.    Social history:  Patient is not working , used to work as a Financial trader and newspaper delivery  and lives in a household with her adult children. Family status is divorced.  The patient used to work in shifts( night/ rotating,), 1 AM - 6 AM.  Pets are present. 1 dog, 7 cats.  Tobacco use: ongoing- 1 ppd.   ETOH use none- not in decades. , Caffeine intake in form of Coffee(2 a day) Soda( none ) Tea ( none) nor energy drinks.  Sleep habits are as follows: The patient's dinner time is variable- between 5-8 PM. The patient goes to bed at variable times- no routines, no rituals established.  Her mind is racing. The preferred  sleep position is prone , with the support of 1 pillow.  Dreams are reportedly rare. There is no usual rise time. The patient wakes up spontaneously at about 5 Am and after 2 hours goes back to  nap for 2 hours. with an alarm.  She reports not feeling refreshed or restored in AM, with symptoms such as dry mouth, throat is sore,, morning headaches, and residual fatigue.  Naps are taken frequently, lasting hours .   Review of Systems: Out of a complete 14 system review, the patient complains of only the following symptoms, and all other reviewed systems are negative.:  Fatigue, sleepiness , snoring, fragmented sleep, Insomnia - chronic.  Insomnia since school age- cyclic - hypersomnia alternating with insomnia- a pattern associated with psychological factors.  Racing mind, history of maniac depressive episodes. Not established with a psychiatrist.    How likely are you to doze in the following situations: 0 = not likely, 1 = slight chance, 2 = moderate chance, 3 = high chance   Sitting and Reading? Watching Television? Sitting inactive in a public place (theater or meeting)? As a passenger in a car for an hour without a break? Lying down in the afternoon when circumstances permit? Sitting and talking to someone? Sitting quietly after lunch without alcohol? In a car, while stopped for a few minutes in traffic?   Total = 8/ 24 points   FSS endorsed at 30/ 63 points.   Social History   Socioeconomic History  . Marital status: Unknown    Spouse name: Not on file  . Number of children: Not on file  . Years of education: Not on file  . Highest education level: Not on file  Occupational History  . Not on file  Tobacco Use  . Smoking status: Current Every Day Smoker    Types: Cigarettes  . Smokeless tobacco: Never Used  Substance and Sexual Activity  . Alcohol use: Not Currently  . Drug use: Not Currently  . Sexual activity: Not on file  Other Topics Concern  . Not on file    Social History Narrative  . Not on file   Social Determinants of Health   Financial Resource Strain:   . Difficulty of Paying Living Expenses: Not on file  Food Insecurity:   . Worried About Programme researcher, broadcasting/film/video in the Last Year: Not on file  . Ran Out of Food in the Last Year: Not on file  Transportation Needs:   . Lack of Transportation (Medical): Not on file  . Lack of Transportation (Non-Medical): Not on file  Physical Activity:   . Days of Exercise per Week: Not on file  . Minutes of Exercise per Session: Not on file  Stress:   . Feeling of Stress : Not on file  Social Connections:   . Frequency of Communication with Friends and Family: Not on file  .  Frequency of Social Gatherings with Friends and Family: Not on file  . Attends Religious Services: Not on file  . Active Member of Clubs or Organizations: Not on file  . Attends Banker Meetings: Not on file  . Marital Status: Not on file    Family History  Problem Relation Age of Onset  . Heart attack Father     Past Medical History:  Diagnosis Date  . Anxiety   . Coronary artery disease   . Diabetes (HCC)   . Hyperlipidemia   . Myocardial infarction (HCC)   . Tobacco abuse     Past Surgical History:  Procedure Laterality Date  . CARDIAC CATHETERIZATION    . CORONARY ARTERY BYPASS GRAFT N/A 03/25/2019   Procedure: CORONARY ARTERY BYPASS GRAFTING (CABG) X THREE, ON PUMP, USING LEFT INTERNAL MAMMARY ARTERY AND LEFT GREATER SAPHENOUS VEIN HARVESTED ENDOSCOPICALLY;  Surgeon: Kerin Perna, MD;  Location: [facility] OR;  Service: Open Heart Surgery;  Laterality: N/A;  . LEFT HEART CATH AND CORONARY ANGIOGRAPHY N/A 03/23/2019   Procedure: LEFT HEART CATH AND CORONARY ANGIOGRAPHY;  Surgeon: Kathleene Hazel, MD;  Location: [facility] INVASIVE CV LAB;  Service: Cardiovascular;  Laterality: N/A;  . None    . TEE WITHOUT CARDIOVERSION N/A 03/25/2019   Procedure: TRANSESOPHAGEAL ECHOCARDIOGRAM (TEE);  Surgeon: Donata Clay, Theron Arista, MD;  Location: [facility] OR;  Service: Open Heart Surgery;  Laterality: N/A;     Current Outpatient Medications on File Prior to Visit  Medication Sig Dispense Refill  . acetaminophen (TYLENOL) 325 MG tablet Take 1-2 tablets (325-650 mg total) by mouth every 4 (four) hours as needed for mild pain.    Marland Kitchen aspirin EC 81 MG EC tablet Take 1 tablet (81 mg total) by mouth daily.    Marland Kitchen atorvastatin (LIPITOR) 80 MG tablet Take 1 tablet (80 mg total) by mouth daily at 6 PM. 30 tablet 1  . Cholecalciferol (QC VITAMIN D3) 125 MCG (5000 UT) TABS Take 1 tablet (5,000 Units total) by mouth daily. 30 tablet 1  . clopidogrel (PLAVIX) 75 MG tablet Take 1 tablet (75 mg total) by mouth daily. 30 tablet 2  . FLUoxetine (PROZAC) 20 MG capsule Take 1 capsule (20 mg total) by mouth daily. 90 capsule 3  . gabapentin (NEURONTIN) 600 MG tablet Take 1 tablet (600 mg total) by mouth 3 (three) times daily. 90 tablet 1  . glipiZIDE (GLUCOTROL) 10 MG tablet Take 1 tablet (10 mg total) by mouth 2 (two) times daily before a meal. 60 tablet 1  . metoprolol tartrate (LOPRESSOR) 25 MG tablet Take 1 tablet (25 mg total) by mouth 2 (two) times daily. 60 tablet 0  . sitaGLIPtin-metformin (JANUMET) 50-1000 MG tablet Take 1 tablet by mouth 2 (two) times daily with a meal. 60 tablet 0  . temazepam (RESTORIL) 15 MG capsule Take 1 capsule (15 mg total) by mouth at bedtime as needed for sleep. 30 capsule 0  . varenicline (CHANTIX) 0.5 MG tablet Take 1 tablet (0.5 mg total) by mouth 2 (two) times daily. 60 tablet 0   No current facility-administered medications on file prior to visit.    No Known Allergies  Physical exam:  Today's Vitals   06/28/19 1113  BP: 104/72  Pulse: (!) 101  Temp: (!) 97.3 F (36.3 C)  Weight: 177 lb (80.3 kg)  Height: 5\' 2"  (1.575 m)   Body mass index is 32.37 kg/m.   Wt Readings from Last 3 Encounters:  06/28/19 177 lb (80.3  kg)  06/15/19 180 lb (81.6 kg)  05/26/19 182 lb 3.2 oz (82.6 kg)      Ht Readings from Last 3 Encounters:  06/28/19 5\' 2"  (1.575 m)  06/15/19 5\' 2"  (1.575 m)  05/26/19 5\' 2"  (1.575 m)      General: The patient is awake, alert and appears not in acute distress. The patient is well groomed. Head: Normocephalic, atraumatic. Neck is supple. Mallampati 2-  Lage tonsils, cratered.   neck circumference: 16. 5  inches . Nasal airflow not  patent.  Retrognathia is not seen.  Dental status:  Wisdom teeth were removed.  Cardiovascular:  Regular rate and cardiac rhythm by pulse,  without distended neck veins. Respiratory: Lungs are clear to auscultation.  Skin:  Without evidence of ankle edema, or rash. Trunk: The patient's posture is erect.   Neurologic exam : The patient is awake and alert, oriented to place and time.   Memory subjective described as impaired -  Recognition of faces, finding names, words in mid -sentance.   Attention span & concentration ability appears normal.  Speech is fluent,  without  dysarthria, dysphonia or aphasia.  Mood and affect are appropriate.   Cranial nerves: no loss of smell or taste reported  Pupils are equal and briskly reactive to light. Funduscopic exam deferred.   Extraocular movements in vertical and horizontal planes were intact and without nystagmus. No Diplopia. Visual fields by finger perimetry are intact.  Hearing was intact to soft voice and finger rubbing.  Facial sensation - right facial numbness, right neck and arm and hand. Facial motor strength is symmetric and tongue and uvula move midline.  Neck ROM : rotation, tilt and flexion extension were normal for age and shoulder shrug was symmetrical.    Motor exam:  Symmetric bulk, tone and ROM.   Normal tone without cog wheeling, symmetric grip strength . Sensory:  Fine touch, pinprick and vibration were decreased on the right body-  Especially her arm and hand.  Proprioception tested in the upper extremities was affected , pronator drift on the right.    Coordination: Rapid alternating movements in the fingers/hands were of normal speed.  The Finger-to-nose maneuver was with evidence of ataxia, dysmetria on the right,  Significant weakness, grip strength loss.  Gait and station: Patient could rise unassisted from a seated position. Toe and heel walk were deferred.  Deep tendon reflexes: in the  upper and lower right  extremities are more brisk than on the left up-going toe on the right.     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  2) snoring and apneas were witnessed in hopsital, associated with oxygen desaturations due to smoking COPD.  3)  Stroke residual is affecting all ADL. Walking, dressing, continence. Dropping objects.    My Plan is to proceed with:  1)  Sleep study , prefer attended sleep study. Awaiting medicaid coverage.  2)  She is awaiting disability determination, contract doctor. Needs to continue with PT, OT and fall prevention.  3)  Primary neurologist to address memory testing.   I would like to thank Dr. Nils Pyle and Dr  Pearlean Brownie  and Ihor Austin, [address] [place],  [place] [address] for allowing me to meet with and to take care of this pleasant patient's sleep evaluation.     I plan to follow up either personally or through our NP in 2  month.   CC: I will share my notes with Cardiologist.  .  Electronically signed by: Melvyn Novas, MD 06/28/2019 11:27 AM  [facility] and [facility] Sleep Board certified by The [employer] of Sleep Medicine and Diplomate of the [employer] of Sleep  Medicine. Board certified In Neurology through the ABPN, Fellow of the [employer] of Neurology. Medical Director of [employer].

## 2019-06-28 NOTE — Patient Instructions (Signed)
Insomnia Insomnia is a sleep disorder that makes it difficult to fall asleep or stay asleep. Insomnia can cause fatigue, low energy, difficulty concentrating, mood swings, and poor performance at work or school. There are three different ways to classify insomnia:  Difficulty falling asleep.  Difficulty staying asleep.  Waking up too early in the morning. Any type of insomnia can be long-term (chronic) or short-term (acute). Both are common. Short-term insomnia usually lasts for three months or less. Chronic insomnia occurs at least three times a week for longer than three months. What are the causes? Insomnia may be caused by another condition, situation, or substance, such as:  Anxiety.  Certain medicines.  Gastroesophageal reflux disease (GERD) or other gastrointestinal conditions.  Asthma or other breathing conditions.  Restless legs syndrome, sleep apnea, or other sleep disorders.  Chronic pain.  Menopause.  Stroke.  Abuse of alcohol, tobacco, or illegal drugs.  Mental health conditions, such as depression.  Caffeine.  Neurological disorders, such as Alzheimer's disease.  An overactive thyroid (hyperthyroidism). Sometimes, the cause of insomnia may not be known. What increases the risk? Risk factors for insomnia include:  Gender. Women are affected more often than men.  Age. Insomnia is more common as you get older.  Stress.  Lack of exercise.  Irregular work schedule or working night shifts.  Traveling between different time zones.  Certain medical and mental health conditions. What are the signs or symptoms? If you have insomnia, the main symptom is having trouble falling asleep or having trouble staying asleep. This may lead to other symptoms, such as:  Feeling fatigued or having low energy.  Feeling nervous about going to sleep.  Not feeling rested in the morning.  Having trouble concentrating.  Feeling irritable, anxious, or depressed. How  is this diagnosed? This condition may be diagnosed based on:  Your symptoms and medical history. Your health care provider may ask about: ? Your sleep habits. ? Any medical conditions you have. ? Your mental health.  A physical exam. How is this treated? Treatment for insomnia depends on the cause. Treatment may focus on treating an underlying condition that is causing insomnia. Treatment may also include:  Medicines to help you sleep.  Counseling or therapy.  Lifestyle adjustments to help you sleep better. Follow these instructions at home: Eating and drinking   Limit or avoid alcohol, caffeinated beverages, and cigarettes, especially close to bedtime. These can disrupt your sleep.  Do not eat a large meal or eat spicy foods right before bedtime. This can lead to digestive discomfort that can make it hard for you to sleep. Sleep habits   Keep a sleep diary to help you and your health care provider figure out what could be causing your insomnia. Write down: ? When you sleep. ? When you wake up during the night. ? How well you sleep. ? How rested you feel the next day. ? Any side effects of medicines you are taking. ? What you eat and drink.  Make your bedroom a dark, comfortable place where it is easy to fall asleep. ? Put up shades or blackout curtains to block light from outside. ? Use a white noise machine to block noise. ? Keep the temperature cool.  Limit screen use before bedtime. This includes: ? Watching TV. ? Using your smartphone, tablet, or computer.  Stick to a routine that includes going to bed and waking up at the same times every day and night. This can help you fall asleep faster. Consider   making a quiet activity, such as reading, part of your nighttime routine.  Try to avoid taking naps during the day so that you sleep better at night.  Get out of bed if you are still awake after 15 minutes of trying to sleep. Keep the lights down, but try reading or  doing a quiet activity. When you feel sleepy, go back to bed. General instructions  Take over-the-counter and prescription medicines only as told by your health care provider.  Exercise regularly, as told by your health care provider. Avoid exercise starting several hours before bedtime.  Use relaxation techniques to manage stress. Ask your health care provider to suggest some techniques that may work well for you. These may include: ? Breathing exercises. ? Routines to release muscle tension. ? Visualizing peaceful scenes.  Make sure that you drive carefully. Avoid driving if you feel very sleepy.  Keep all follow-up visits as told by your health care provider. This is important. Contact a health care provider if:  You are tired throughout the day.  You have trouble in your daily routine due to sleepiness.  You continue to have sleep problems, or your sleep problems get worse. Get help right away if:  You have serious thoughts about hurting yourself or someone else. If you ever feel like you may hurt yourself or others, or have thoughts about taking your own life, get help right away. You can go to your nearest emergency department or call:  Your local emergency services (911 in the [place]  A suicide crisis helpline, such as the National Suicide Prevention Lifeline at (402)217-4088. This is open 24 hours a day. Summary  Insomnia is a sleep disorder that makes it difficult to fall asleep or stay asleep.  Insomnia can be long-term (chronic) or short-term (acute).  Treatment for insomnia depends on the cause. Treatment may focus on treating an underlying condition that is causing insomnia.  Keep a sleep diary to help you and your health care provider figure out what could be causing your insomnia. This information is not intended to replace advice given to you by your health care provider. Make sure you discuss any questions you have with your health care provider. Document  Revised: 04/17/2017 Document Reviewed: 02/12/2017 Elsevier Patient Education  2020 Elsevier Inc. Screening for Sleep Apnea  Sleep apnea is a condition in which breathing pauses or becomes shallow during sleep. Sleep apnea screening is a test to determine if you are at risk for sleep apnea. The test is easy and only takes a few minutes. Your health care provider may ask you to have this test in preparation for surgery or as part of a physical exam. What are the symptoms of sleep apnea? Common symptoms of sleep apnea include:  Snoring.  Restless sleep.  Daytime sleepiness.  Pauses in breathing.  Choking during sleep.  Irritability.  Forgetfulness.  Trouble thinking clearly.  Depression.  Personality changes. Most people with sleep apnea are not aware that they have it. Why should I get screened? Getting screened for sleep apnea can help:  Ensure your safety. It is important for your health care providers to know whether or not you have sleep apnea, especially if you are having surgery or have other long-term (chronic) health conditions.  Improve your health and allow you to get a better night's rest. Restful sleep can help you: ? Have more energy. ? Lose weight. ? Improve high blood pressure. ? Improve diabetes management. ? Prevent stroke. ? Prevent car accidents.  How is screening done? Screening usually includes being asked a list of questions about your sleep quality. Some questions you may be asked include:  Do you snore?  Is your sleep restless?  Do you have daytime sleepiness?  Has a partner or spouse told you that you stop breathing during sleep?  Have you had trouble concentrating or memory loss? If your screening test is positive, you are at risk for the condition. Further testing may be needed to confirm a diagnosis of sleep apnea. Where to find more information You can find screening tools online or at your health care clinic. For more information about  sleep apnea screening and healthy sleep, visit these websites:  Centers for Disease Control and Prevention: [employer]at[employer]pl  American Sleep Apnea Association: www.sleepapnea.org Contact a health care provider if:  You think that you may have sleep apnea. Summary  Sleep apnea screening can help determine if you are at risk for sleep apnea.  It is important for your health care providers to know whether or not you have sleep apnea, especially if you are having surgery or have other chronic health conditions.  You may be asked to take a screening test for sleep apnea in preparation for surgery or as part of a physical exam. This information is not intended to replace advice given to you by your health care provider. Make sure you discuss any questions you have with your health care provider. Document Revised: 02/19/2018 Document Reviewed: 08/15/2016 Elsevier Patient Education  [place].

## 2019-06-29 ENCOUNTER — Ambulatory Visit: Payer: Self-pay | Admitting: Cardiothoracic Surgery

## 2019-07-01 ENCOUNTER — Other Ambulatory Visit: Payer: Self-pay | Admitting: Cardiovascular Disease

## 2019-07-01 MED ORDER — CLOPIDOGREL BISULFATE 75 MG PO TABS: 75.0000 mg | ORAL_TABLET | Freq: Every day | ORAL | 3 refills | Status: AC

## 2019-07-01 MED ORDER — METOPROLOL TARTRATE 25 MG PO TABS: 25.0000 mg | ORAL_TABLET | Freq: Two times a day (BID) | ORAL | 3 refills | Status: DC

## 2019-07-01 MED ORDER — METOPROLOL TARTRATE 25 MG PO TABS: 25.0000 mg | ORAL_TABLET | Freq: Two times a day (BID) | ORAL | 3 refills | Status: AC

## 2019-07-01 NOTE — Telephone Encounter (Signed)
OK to refill plavix when needed. Joan Webb

## 2019-07-01 NOTE — Telephone Encounter (Signed)
Pt calling requesting a refill on metoprolol. This medication was prescribed by Dr. Donata Clay. Would Dr. [name] like to refill this medication? Please address

## 2019-07-01 NOTE — Telephone Encounter (Signed)
Pt's medication was sent to pt's pharmacy as requested. Confirmation received.  °

## 2019-07-01 NOTE — Telephone Encounter (Signed)
Dr. Donata Clay discontinued Eliquis and started pt on Plavix after resolution of left arm DVT - 06/15/19.  There looks like still 2 refills left on this.  Will verify with Dr. [name] if this is to be filled by cardiology since she had post op CVA.  OK to refill metoprolol.

## 2019-07-01 NOTE — Telephone Encounter (Signed)
OK to refill. Joan Webb 

## 2019-07-01 NOTE — Telephone Encounter (Signed)
*  STAT* If patient is at the pharmacy, call can be transferred to refill team.   1. Which medications need to be refilled? (please list name of each medication and dose if known)   metoprolol tartrate (LOPRESSOR) 25 MG tablet    clopidogrel (PLAVIX) 75 MG tablet     2. Which pharmacy/location (including street and city if local pharmacy) is medication to be sent to? [facility] - [place], [place] - [address]  3. Do they need a 30 day or 90 day supply? 90 day supply

## 2019-07-01 NOTE — Telephone Encounter (Signed)
Pt is requesting a refill on metoprolol and clopidogrel. These medications were prescribed by Dr. Donata Clay. Would Dr. [name] like to refill these medications? Please address

## 2019-07-05 ENCOUNTER — Encounter: Payer: Self-pay | Attending: Physical Medicine & Rehabilitation | Admitting: Physical Medicine & Rehabilitation

## 2019-07-05 DIAGNOSIS — I10 Essential (primary) hypertension: Secondary | ICD-10-CM | POA: Insufficient documentation

## 2019-07-05 DIAGNOSIS — R269 Unspecified abnormalities of gait and mobility: Secondary | ICD-10-CM | POA: Insufficient documentation

## 2019-07-05 DIAGNOSIS — Z951 Presence of aortocoronary bypass graft: Secondary | ICD-10-CM | POA: Insufficient documentation

## 2019-07-05 DIAGNOSIS — I639 Cerebral infarction, unspecified: Secondary | ICD-10-CM | POA: Insufficient documentation

## 2019-07-05 DIAGNOSIS — E1165 Type 2 diabetes mellitus with hyperglycemia: Secondary | ICD-10-CM | POA: Insufficient documentation

## 2019-07-05 DIAGNOSIS — I82A12 Acute embolism and thrombosis of left axillary vein: Secondary | ICD-10-CM | POA: Insufficient documentation

## 2019-07-05 DIAGNOSIS — E1142 Type 2 diabetes mellitus with diabetic polyneuropathy: Secondary | ICD-10-CM | POA: Insufficient documentation

## 2019-07-06 ENCOUNTER — Telehealth: Payer: Self-pay

## 2019-07-06 NOTE — Telephone Encounter (Signed)
Pt is still waiting approval for Medicaid coverage. Pt is not interested at this time in scheduling sleep study and pay for it out of pocket. Pt was asked once she receives Medicaid card, to give me a call back to schedule sleep study.

## 2019-07-18 ENCOUNTER — Encounter: Payer: Medicaid Other | Attending: Physical Medicine & Rehabilitation | Admitting: Physical Medicine & Rehabilitation

## 2019-07-18 ENCOUNTER — Encounter: Payer: Self-pay | Admitting: Physical Medicine & Rehabilitation

## 2019-07-18 ENCOUNTER — Other Ambulatory Visit: Payer: Self-pay

## 2019-07-18 VITALS — BP 110/73 | HR 84 | Temp 97.8°F | Ht 62.0 in | Wt 180.0 lb

## 2019-07-18 DIAGNOSIS — E1165 Type 2 diabetes mellitus with hyperglycemia: Secondary | ICD-10-CM | POA: Insufficient documentation

## 2019-07-18 DIAGNOSIS — E1142 Type 2 diabetes mellitus with diabetic polyneuropathy: Secondary | ICD-10-CM | POA: Diagnosis not present

## 2019-07-18 DIAGNOSIS — I10 Essential (primary) hypertension: Secondary | ICD-10-CM | POA: Diagnosis present

## 2019-07-18 DIAGNOSIS — I639 Cerebral infarction, unspecified: Secondary | ICD-10-CM

## 2019-07-18 DIAGNOSIS — Z720 Tobacco use: Secondary | ICD-10-CM

## 2019-07-18 DIAGNOSIS — I82A12 Acute embolism and thrombosis of left axillary vein: Secondary | ICD-10-CM

## 2019-07-18 DIAGNOSIS — R269 Unspecified abnormalities of gait and mobility: Secondary | ICD-10-CM | POA: Diagnosis present

## 2019-07-18 DIAGNOSIS — Z951 Presence of aortocoronary bypass graft: Secondary | ICD-10-CM

## 2019-07-18 MED ORDER — AMITRIPTYLINE HCL 10 MG PO TABS: 10.0000 mg | ORAL_TABLET | Freq: Every day | ORAL | 1 refills | Status: DC

## 2019-07-18 NOTE — Progress Notes (Signed)
Subjective:    Patient ID: Joan Webb, female    DOB: 11/22/1970, 49 y.o.   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  Last clinic visit on 05/26/2019.  Since that time, she states she is not in therapies.  She is awaiting disability. She is following up with CTS.  She states she followed up with Neuro.  She is awaiting follow up with sleep specialist. She does not have a PCP. She did not notice benefit with Gabapentin.  She feels increase in depression.  She is smoking 1/2- 1 PPD, she states she is going to stop on [employer]. Patrick's day.  Pain Inventory Average Pain 7 Pain Right Now 7 My pain is burning, tingling and aching  In the last 24 hours, has pain interfered with the following? General activity 10 Relation with others 9 Enjoyment of life 9 What TIME of day is your pain at its worst? night, night  Sleep (in general) Poor  Pain is worse with: walking and standing Pain improves with: rest and medication Relief from Meds: 5  Mobility walk without assistance walk with assistance use a walker ability to climb steps?  yes do you drive?  no  Function not employed: date last employed 12-19 I need assistance with the following:  feeding, dressing, bathing, meal prep, household duties and shopping  Neuro/Psych bowel control problems numbness tingling confusion depression anxiety  Prior Studies Any changes since last visit?  no  Physicians involved in your care Any changes since last visit?  no   Family History  Problem Relation Age of Onset  . Heart attack Father    Social History   Socioeconomic History  . Marital status: Unknown    Spouse name: Not on file  . Number of children: Not on file  . Years of  education: Not on file  . Highest education level: Not on file  Occupational History  . Not on file  Tobacco Use  . Smoking status: Current Every Day Smoker    Types: Cigarettes  . Smokeless tobacco: Never Used  Substance and Sexual Activity  . Alcohol use: Not Currently  . Drug use: Not Currently  . Sexual activity: Not on file  Other Topics Concern  . Not on file  Social History Narrative  . Not on file   Social Determinants of Health   Financial Resource Strain:   . Difficulty of Paying Living Expenses: Not on file  Food Insecurity:   . Worried About Programme researcher, broadcasting/film/video in the Last Year: Not on file  . Ran Out of Food in the Last Year: Not on file  Transportation Needs:   . Lack of Transportation (Medical): Not on file  . Lack of Transportation (Non-Medical): Not on file  Physical Activity:   . Days of Exercise per Week: Not on file  . Minutes of Exercise per Session: Not on file  Stress:   . Feeling of Stress : Not on file  Social Connections:   . Frequency of Communication with Friends and Family: Not on file  . Frequency of Social Gatherings with Friends and Family: Not on file  . Attends Religious Services: Not on file  . Active Member of Clubs or Organizations: Not on file  . Attends Banker Meetings: Not on file  .  Marital Status: Not on file   Past Surgical History:  Procedure Laterality Date  . CARDIAC CATHETERIZATION    . CORONARY ARTERY BYPASS GRAFT N/A 03/25/2019   Procedure: CORONARY ARTERY BYPASS GRAFTING (CABG) X THREE, ON PUMP, USING LEFT INTERNAL MAMMARY ARTERY AND LEFT GREATER SAPHENOUS VEIN HARVESTED ENDOSCOPICALLY;  Surgeon: Kerin Perna, MD;  Location: [facility] OR;  Service: Open Heart Surgery;  Laterality: N/A;  . LEFT HEART CATH AND CORONARY ANGIOGRAPHY N/A 03/23/2019   Procedure: LEFT HEART CATH AND CORONARY ANGIOGRAPHY;  Surgeon: Kathleene Hazel, MD;  Location: [facility] IN[facility];  Service: Cardiovascular;  Laterality: N/A;  .  None    . TEE WITHOUT CARDIOVERSION N/A 03/25/2019   Procedure: TRANSESOPHAGEAL ECHOCARDIOGRAM (TEE);  Surgeon: Donata Clay, Theron Arista, MD;  Location: [facility] OR;  Service: Open Heart Surgery;  Laterality: N/A;   Past Medical History:  Diagnosis Date  . Anxiety   . Coronary artery disease   . Diabetes (H[facility])   . Hyperlipidemia   . Myocardial infarction (H[facility])   . Tobacco abuse    BP 110/73   Pulse 84   Temp 97.8 F (36.6 C)   Ht 5\' 2"  (1.575 m)   Wt 180 lb (81.6 kg)   SpO2 96%   BMI 32.92 kg/m   Opioid Risk Score:   Fall Risk Score:  `1  Depression screen PHQ 2/9  Depression screen PHQ 2/9 05/26/2019  Decreased Interest 3  Down, Depressed, Hopeless 2  PHQ - 2 Score 5  Altered sleeping 3  Tired, decreased energy 3  Change in appetite 2  Feeling bad or failure about yourself  2  Trouble concentrating 3  Moving slowly or fidgety/restless 3  Suicidal thoughts 1  PHQ-9 Score 22  Difficult doing work/chores Extremely dIfficult     Review of Systems  Constitutional: Positive for diaphoresis and unexpected weight change.  HENT: Negative.   Eyes: Negative.   Respiratory: Positive for apnea, chest tightness and shortness of breath.   Cardiovascular: Positive for chest pain and leg swelling.  Gastrointestinal: Positive for diarrhea.  Endocrine: Negative.   Genitourinary: Negative.   Musculoskeletal: Positive for arthralgias, back pain, gait problem and myalgias.  Skin: Negative.   Allergic/Immunologic: Negative.   Neurological: Positive for dizziness and numbness.  Hematological: Negative.   Psychiatric/Behavioral: Positive for confusion and dysphoric mood. The patient is nervous/anxious.        Objective:   Physical Exam Constitutional: No distress .  Psych: Flat Musc: No edema in extremities.  No tenderness in extremities. 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  1.  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  Sternal precautions             Continue HEP  Continue to follow up with CTS  Cont follow up with Neuro  2.  LUE DVT Cont Elquis  3. Pain Management:   Cont meds  Wean Oxycodone  Will d/c Robaxin 500 BID PRN  Will order Baclofen 10 TID  Increased Gabapentin to 600 TID  for RUE neuropathic pain, but patient states she never received prescription  4. Reactive depression/PBA  Increased Prozaac, states she never received higher dose  5.  Diabetes mellitus with peripheral neuropathy with hyperglycemia.   Cont meds  Still has not established with PCP, reminded again  6.  Tobacco abuse.    Weaning patch, smoking 1/2- 1PPD  Educated  7. Sleep disturbance  Cont Melatonin   Improving  See #1  Will order Elavil 10mg  qhs, educated on signs/symptoms of serotonin syndrome  8. Gait abnormality  Cont therapies  No assistive device required anymore

## 2019-08-16 ENCOUNTER — Encounter: Payer: Medicaid Other | Admitting: Physical Medicine & Rehabilitation

## 2019-08-16 ENCOUNTER — Telehealth: Payer: Self-pay | Admitting: Physical Medicine & Rehabilitation

## 2019-08-16 MED ORDER — GABAPENTIN 600 MG PO TABS: 600.0000 mg | ORAL_TABLET | Freq: Three times a day (TID) | ORAL | 1 refills | Status: AC

## 2019-08-16 MED ORDER — AMITRIPTYLINE HCL 10 MG PO TABS: 10.0000 mg | ORAL_TABLET | Freq: Every day | ORAL | 1 refills | Status: DC

## 2019-08-16 MED ORDER — FLUOXETINE HCL 20 MG PO CAPS: 20.0000 mg | ORAL_CAPSULE | Freq: Every day | ORAL | 3 refills | Status: DC

## 2019-08-16 NOTE — Telephone Encounter (Signed)
Joan Webb called to tell us that she is so tired and couldn't make it to our office.  Dr. Allena Katz doesn't have any openings until April 26 and she will be out of medications.  What can we do for Joan Webb?  Please advise.

## 2019-08-16 NOTE — Telephone Encounter (Signed)
Refills ordered.

## 2019-08-16 NOTE — Telephone Encounter (Signed)
Please make sure the refills are sent to the [facility] in [place].

## 2019-08-16 NOTE — Telephone Encounter (Signed)
Called and notified ,inform her that all 3 rx at [facility] in [place].

## 2019-08-24 ENCOUNTER — Telehealth: Payer: Self-pay | Admitting: Cardiovascular Disease

## 2019-08-24 NOTE — Telephone Encounter (Signed)
I spoke to the patient who had not taken her Metoprolol 25 mg bid for the past few weeks and noticed that she has been feeling "sluggish and fatigued".    She restarted her Metoprolol on 4/5 and I told her to monitor BP/HR and symptoms for 1 week and update [place].  She verbalized understanding.

## 2019-08-24 NOTE — Telephone Encounter (Signed)
New message     Patient c/o being extremely fatigue .  She states that she did not take her metoprolol for about 1-2 weeks because she did not realize that she was out of medication.  She got a refill and started taking it again wed or thurs of last week.  Not sure if being off the medication caused the fatigue or not.  Patient also states that she had a C.A.B.G. on nov 6 by Dr Morton Peters and she also sees a neurologist for other things.  Not sure if the fatigue is cardiac or something else.  Please advise.

## 2019-08-30 ENCOUNTER — Ambulatory Visit: Payer: Self-pay | Admitting: Adult Health

## 2019-09-01 ENCOUNTER — Encounter: Payer: Self-pay | Admitting: Adult Health

## 2019-09-07 ENCOUNTER — Other Ambulatory Visit: Payer: Self-pay

## 2019-09-07 ENCOUNTER — Encounter: Payer: Self-pay | Admitting: Cardiothoracic Surgery

## 2019-09-07 ENCOUNTER — Ambulatory Visit (INDEPENDENT_AMBULATORY_CARE_PROVIDER_SITE_OTHER): Payer: Medicaid Other | Admitting: Cardiothoracic Surgery

## 2019-09-07 VITALS — BP 118/84 | HR 89 | Temp 97.7°F | Resp 16 | Ht 62.0 in | Wt 179.8 lb

## 2019-09-07 DIAGNOSIS — M792 Neuralgia and neuritis, unspecified: Secondary | ICD-10-CM | POA: Insufficient documentation

## 2019-09-07 DIAGNOSIS — I63512 Cerebral infarction due to unspecified occlusion or stenosis of left middle cerebral artery: Secondary | ICD-10-CM | POA: Diagnosis not present

## 2019-09-07 DIAGNOSIS — Z951 Presence of aortocoronary bypass graft: Secondary | ICD-10-CM

## 2019-09-07 DIAGNOSIS — I2510 Atherosclerotic heart disease of native coronary artery without angina pectoris: Secondary | ICD-10-CM
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
## 2019-09-12 ENCOUNTER — Encounter: Payer: Self-pay | Admitting: Physical Medicine & Rehabilitation

## 2019-09-27 ENCOUNTER — Encounter: Payer: Self-pay | Admitting: Adult Health

## 2019-09-27 ENCOUNTER — Other Ambulatory Visit: Payer: Self-pay | Admitting: Cardiothoracic Surgery

## 2019-10-11 DIAGNOSIS — Z736 Limitation of activities due to disability: Secondary | ICD-10-CM

## 2019-11-28 ENCOUNTER — Telehealth: Payer: Self-pay | Admitting: Adult Health

## 2019-11-28 DIAGNOSIS — R29898 Other symptoms and signs involving the musculoskeletal system: Secondary | ICD-10-CM

## 2019-11-28 NOTE — Telephone Encounter (Signed)
Jeani Hawking calling and needing a  New order order for occupational  therapy  Occupational therapy at [facility] please put in order  Occupational therapy  Upper extremities    Pt Lower Extremities  [facility] [phone] - [phone]

## 2019-11-28 NOTE — Addendum Note (Signed)
Addended by: Raliegh Ip on: 11/28/2019 04:44 PM   Modules accepted: Orders

## 2019-12-06 ENCOUNTER — Other Ambulatory Visit: Payer: Self-pay

## 2019-12-06 ENCOUNTER — Encounter (HOSPITAL_COMMUNITY): Payer: Self-pay | Admitting: Occupational Therapy

## 2019-12-06 ENCOUNTER — Ambulatory Visit (HOSPITAL_COMMUNITY): Payer: Medicaid Other | Attending: Adult Health | Admitting: Occupational Therapy

## 2019-12-06 DIAGNOSIS — R278 Other lack of coordination: Secondary | ICD-10-CM | POA: Diagnosis not present

## 2019-12-06 DIAGNOSIS — M25611 Stiffness of right shoulder, not elsewhere classified: Secondary | ICD-10-CM | POA: Diagnosis present

## 2019-12-06 DIAGNOSIS — R29898 Other symptoms and signs involving the musculoskeletal system: Secondary | ICD-10-CM | POA: Diagnosis present

## 2019-12-06 DIAGNOSIS — G8929 Other chronic pain: Secondary | ICD-10-CM | POA: Insufficient documentation

## 2019-12-06 DIAGNOSIS — M25511 Pain in right shoulder: Secondary | ICD-10-CM | POA: Diagnosis present

## 2019-12-06 NOTE — Patient Instructions (Signed)
1) SHOULDER: Flexion On Table ° ° °Place hands on towel placed on table, elbows straight. Lean forward with you upper body, pushing towel away from body.  _10__ reps per set, __2-3_ sets per day ° °2) Abduction (Passive) ° ° °With arm out to side, resting on towel placed on table with palm DOWN, keeping trunk away from table, lean to the side while pushing towel away from body.  °Repeat __10__ times. Do __2-3__ sessions per day. ° °Copyright © [employer]. All rights reserved.  ° ° ° °3) Internal Rotation (Assistive) ° ° °Seated with elbow bent at right angle and held against side, slide arm on table surface in an inward arc keeping elbow anchored in place. °Repeat __10__ times. Do __2-3__ sessions per day. °Activity: Use this motion to brush crumbs off the table. ° °Copyright © [employer]. All rights reserved.  ° °

## 2019-12-06 NOTE — Therapy (Signed)
[facility] [address] [place], [place], [address] Phone: [phone]   Fax:  [phone]  Occupational Therapy Evaluation  Patient Details  Name: Joan Webb MRN: [id number] Date of Birth: 12/22/1970 Referring Provider (OT): Ihor Austin, NP   Encounter Date: 12/06/2019   OT End of Session - 12/06/19 1655    Visit Number 1    Number of Visits 16    Date for OT Re-Evaluation 02/04/20   mini-reassessment 01/03/20   Authorization Type [employer]    Authorization Time Period Not in [facility]-will request auth. Pt given information on switching to in-network plan, will be self-pay until authorized    OT Start Time 1622    OT Stop Time 1710    OT Time Calculation (min) 48 min    Activity Tolerance Patient tolerated treatment well    Behavior During Therapy [facility] for tasks assessed/performed           Past Medical History:  Diagnosis Date  . Anxiety   . Coronary artery disease   . Diabetes (HCC)   . Hyperlipidemia   . Myocardial infarction (HCC)   . Tobacco abuse     Past Surgical History:  Procedure Laterality Date  . CARDIAC CATHETERIZATION    . CORONARY ARTERY BYPASS GRAFT N/A 03/25/2019   Procedure: CORONARY ARTERY BYPASS GRAFTING (CABG) X THREE, ON PUMP, USING LEFT INTERNAL MAMMARY ARTERY AND LEFT GREATER SAPHENOUS VEIN HARVESTED ENDOSCOPICALLY;  Surgeon: Kerin Perna, MD;  Location: [facility] OR;  Service: Open Heart Surgery;  Laterality: N/A;  . LEFT HEART CATH AND CORONARY ANGIOGRAPHY N/A 03/23/2019   Procedure: LEFT HEART CATH AND CORONARY ANGIOGRAPHY;  Surgeon: Kathleene Hazel, MD;  [facility] INVASIVE CV LAB;  Service: Cardiovascular;  Laterality: N/A;  . None    . TEE WITHOUT CARDIOVERSION N/A 03/25/2019   Procedure: TRANSESOPHAGEAL ECHOCARDIOGRAM (TEE);  Surgeon: Donata Clay, Theron Arista, MD;  Location: [facility] OR;  Service: Open Heart Surgery;  Laterality: N/A;    There were no vitals filed for this  visit.   Subjective Assessment - 12/06/19 1654    Subjective  S: I've been working on my fine motor skills.    Pertinent History Pt is a 49 y/o female s/p CABG on 03/24/20 and CVA on 03/26/2020 presenting for evaluation of RUE weakness. Pt reports OT services at CIR and HHOT. Pt initially presented for services in 1/21 but did not have insurance therefore did not return for visits. Pt was referred to occupational therapy for evaluation and treatment by Ihor Austin, NP.    Patient Stated Goals To be able to use my arm more.    Currently in Pain? Yes    Pain Score 4     Pain Location Shoulder    Pain Orientation Right    Pain Descriptors / Indicators Aching;Sore    Pain Type Chronic pain    Pain Radiating Towards N/A    Pain Onset More than a month ago    Pain Frequency Intermittent    Aggravating Factors  unsure    Pain Relieving Factors movement    Effect of Pain on Daily Activities mod effect on ADLs    Multiple Pain Sites No             [facility] OT Assessment - 12/06/19 1622      Assessment   Medical Diagnosis RUE weakness s/p CVA    Referring Provider (OT) Ihor Austin, NP    Onset Date/Surgical Date 03/29/19  Hand Dominance Right    Next MD Visit 12/08/2019    Prior Therapy [facility] at [facility], HHOT      Precautions   Precautions Other (comment)    Precaution Comments s/p CABG on 03/25/2019      Restrictions   Weight Bearing Restrictions No      Balance Screen   Has the patient fallen in the past 6 months No      Prior Function   Level of Independence Independent    Vocation Unemployed    Leisure riding horses      ADL   ADL comments Pt is having difficulty with dressing-donning bra, bathing, reaching up overhead, grasping and holding objects. Sleeping is difficult due to pain in hands from neuropathy. Pt is having difficulty with feeding herself with her right hand, is able to feed with left hand      Written Expression   Dominant Hand Right      Cognition   Overall  Cognitive Status Within Functional Limits for tasks assessed      Sensation   Light Touch Impaired by gross assessment   has severe neuropathy in bilateral hands     Coordination   9 Hole Peg Test Right;Left    Right 9 Hole Peg Test 53.16"    Left 9 Hole Peg Test 23.62"      ROM / Strength   AROM / PROM / Strength PROM      Palpation   Palpation comment mod to max fascial restrictions in right upper arm, trapezius, and scapular regions      AROM   Overall AROM Comments Assessed seated, er/IR adducted. Elbow and wrist are WNL    AROM Assessment Site Shoulder    Right/Left Shoulder Right    Right Shoulder Flexion 76 Degrees    Right Shoulder ABduction 58 Degrees    Right Shoulder Internal Rotation 90 Degrees    Right Shoulder External Rotation 18 Degrees      PROM   Overall PROM Comments Assessed supine, er/IR adducted    PROM Assessment Site Shoulder    Right/Left Shoulder Right    Right Shoulder Flexion 104 Degrees    Right Shoulder ABduction 60 Degrees    Right Shoulder Internal Rotation 90 Degrees    Right Shoulder External Rotation 18 Degrees      Strength   Strength Assessment Site Shoulder;Elbow;Forearm;Wrist;Hand    Right/Left Shoulder Right    Right Shoulder Flexion 3-/5    Right Shoulder ABduction 3-/5    Right Shoulder Internal Rotation 4-/5    Right Shoulder External Rotation 3/5    Right/Left Elbow Right    Right Elbow Flexion 3/5    Right Elbow Extension 3+/5    Right/Left Forearm Right    Right Forearm Pronation 3/5    Right Forearm Supination 3/5    Right/Left Wrist Right    Right Wrist Flexion 3+/5    Right Wrist Extension 3/5    Right Wrist Radial Deviation 3/5    Right Wrist Ulnar Deviation 3/5    Right/Left hand Right;Left    Right Hand Gross Grasp Functional    Right Hand Grip (lbs) 12    Right Hand Lateral Pinch 4 lbs    Right Hand 3 Point Pinch 3 lbs                           OT Education - 12/06/19 1651    Education  Details table  slides    Person(s) Educated Patient    Methods Explanation;Demonstration;Handout    Comprehension Verbalized understanding;Returned demonstration            OT Short Term Goals - 12/06/19 1726      OT SHORT TERM GOAL #1   Title Pt will be provided with and educated on HEP to improve use of RUE during ADL completion.    Time 4    Period Weeks    Status New    Target Date 01/05/20      OT SHORT TERM GOAL #2   Title Pt will increase RUE strength to 4-/5 to improve ability to use RUE during dressing and bathing tasks.    Time 4    Period Weeks    Status New      OT SHORT TERM GOAL #3   Title Pt will increase RUE A/ROM to [facility] to improve ability to reach items in overhead cabinets.    Time 4    Period Weeks    Status New      OT SHORT TERM GOAL #4   Title Pt will increase right grip strength by 12# and pinch strength by 3# to improve ability to hold eating utensils and feed herself.    Time 4    Period Weeks    Status New      OT SHORT TERM GOAL #5   Title Pt will increase right fine motor coordination by completing 9 hole peg test in under 45" to improve ability to perform grooming tasks such as brushing teeth or hair using RUE.    Time 4    Period Weeks    Status New             OT Long Term Goals - 12/06/19 1727      OT LONG TERM GOAL #1   Title Pt will increase RUE strength to 4/5 or greater to improve ability to lift and support grandchild.    Time 8    Period Weeks    Status New    Target Date 02/04/20      OT LONG TERM GOAL #2   Title Pt will increase right grip strength by 25# and pinch strength by 5# to improve ability to grasp and maintain hold on items.    Time 8    Period Weeks    Status New      OT LONG TERM GOAL #3   Title Pt will increase RUE fine motor coordination by completing 9 hole peg test in 30" or less to improve ability to tie shoes.    Time 8    Period Weeks    Status New      OT LONG TERM GOAL #4   Title Pt will  be educated on pain management techniques for neuropathy in her hands to improve ability to sleep at night.    Time 8    Period Weeks    Status New      OT LONG TERM GOAL #5   Title Pt will decrease fascial restrictions in RUE to improve mobility required for functional reaching tasks.    Time 8    Period Weeks    Status New                 Plan - 12/06/19 1724    Clinical Impression Statement A: Pt is a 49 y/o female presenting with RUE weakness s/p CVA on 03/26/2020 during hospital stay secondary to CABG sx.  Pt reports improvement in RUE since this initial evaluation in 05/2019, however reports she has difficulty using the RUE for daily tasks due to weakness and poor ability to grip items. Pt inconsistent during strength testing, unable to test sensation due to time constraints (pt arrived late) however pt reporting numbness in right palm. Pt has insurance not in [facility] network and was provided with information on switching to a plan that is in-network.    OT Occupational Profile and History Problem Focused Assessment - Including review of records relating to presenting problem    Occupational performance deficits (Please refer to evaluation for details): ADL's;IADL's;Rest and Sleep;Work;Leisure    Body Structure / Function / Physical Skills ADL;Endurance;UE functional use;Fascial restriction;Pain;FMC;ROM;IADL;Strength    Rehab Potential Good    Clinical Decision Making Several treatment options, min-mod task modification necessary    Comorbidities Affecting Occupational Performance: May have comorbidities impacting occupational performance    Modification or Assistance to Complete Evaluation  No modification of tasks or assist necessary to complete eval    OT Frequency 2x / week    OT Duration 8 weeks    OT Treatment/Interventions Self-care/ADL training;Ultrasound;Patient/family education;Passive range of motion;Paraffin;Cryotherapy;Electrical Stimulation;Splinting;Moist  Heat;Therapeutic exercise;Manual Therapy;Therapeutic activities;Neuromuscular education    Plan P: Pt will benefit from skilled OT services to improve RUE functioning during daily tasks, increase strength, increase grip and pinch strength, and improve coordination. Treatment plan: General RUE strengthening, grip and pinch strengthening, fine motor coordination tasks, weight-bearing, modalities prn    OT Home Exercise Plan eval: table slides    Consulted and Agree with Plan of Care Patient           Patient will benefit from skilled therapeutic intervention in order to improve the following deficits and impairments:   Body Structure / Function / Physical Skills: ADL, Endurance, UE functional use, Fascial restriction, Pain, FMC, ROM, IADL, Strength       Visit Diagnosis: Other lack of coordination  Other symptoms and signs involving the musculoskeletal system  Chronic right shoulder pain  Stiffness of right shoulder, not elsewhere classified    Problem List Patient Active Problem List   Diagnosis Date Noted  . Neuropathic pain of right hand 09/07/2019  . Tobacco abuse 07/18/2019  . Stroke (HCC) 04/27/2019  . Abnormality of gait 04/21/2019  . Thrombocytopenia (HCC)   . Acute deep vein thrombosis (DVT) of axillary vein of left upper extremity (HCC)   . Swelling   . Sleep disturbance   . Uncontrolled type 2 diabetes mellitus with hyperglycemia (HCC)   . Hypoalbuminemia due to protein-calorie malnutrition (HCC)   . Hyponatremia   . Labile blood glucose   . Spasticity   . Constipation   . Subcortical infarction (HCC) 03/30/2019  . Acute cerebral infarction (HCC)   . Acute blood loss anemia   . Class 1 obesity due to excess calories with serious comorbidity and body mass index (BMI) of 32.0 to 32.9 in adult   . Cognitive deficit, post-stroke   . Coronary artery disease involving native heart without angina pectoris   . Diabetic peripheral neuropathy (HCC)   . Essential  hypertension   . 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 Acute ST elevation myocardial infarction (STEMI) of inferior wall (HCC) 03/23/2019  . ST elevation myocardial infarction (STEMI) of inferior wall [facility])    Ezra Sites, OTR/L  [phone] 12/06/2019, 5:28 PM  [facility] [address] [place], [place], [address] Phone: [phone]   Fax:  [phone]  Name: Joan Webb MRN: [id number] Date of Birth: [birth year]

## 2019-12-08 ENCOUNTER — Ambulatory Visit: Payer: Self-pay | Admitting: Adult Health

## 2019-12-09 ENCOUNTER — Telehealth (HOSPITAL_COMMUNITY): Payer: Self-pay | Admitting: Occupational Therapy

## 2019-12-09 ENCOUNTER — Encounter (HOSPITAL_COMMUNITY): Payer: Medicaid Other | Admitting: Occupational Therapy

## 2019-12-09 NOTE — Telephone Encounter (Signed)
Lack of transportation due to cx today

## 2019-12-14 ENCOUNTER — Encounter (HOSPITAL_COMMUNITY): Payer: Medicaid Other | Admitting: Specialist

## 2019-12-16 ENCOUNTER — Ambulatory Visit (HOSPITAL_COMMUNITY): Payer: Medicaid Other | Admitting: Occupational Therapy

## 2019-12-21 ENCOUNTER — Ambulatory Visit (HOSPITAL_COMMUNITY): Payer: Medicaid Other | Attending: Adult Health | Admitting: Occupational Therapy

## 2019-12-21 DIAGNOSIS — M25611 Stiffness of right shoulder, not elsewhere classified: Secondary | ICD-10-CM | POA: Insufficient documentation

## 2019-12-21 DIAGNOSIS — G8929 Other chronic pain: Secondary | ICD-10-CM | POA: Insufficient documentation

## 2019-12-21 DIAGNOSIS — R29898 Other symptoms and signs involving the musculoskeletal system: Secondary | ICD-10-CM | POA: Insufficient documentation

## 2019-12-21 DIAGNOSIS — M25511 Pain in right shoulder: Secondary | ICD-10-CM | POA: Insufficient documentation

## 2019-12-21 DIAGNOSIS — R278 Other lack of coordination: Secondary | ICD-10-CM | POA: Insufficient documentation

## 2019-12-23 ENCOUNTER — Other Ambulatory Visit: Payer: Self-pay

## 2019-12-23 ENCOUNTER — Ambulatory Visit (HOSPITAL_COMMUNITY): Payer: Medicaid Other | Admitting: Occupational Therapy

## 2019-12-23 DIAGNOSIS — R278 Other lack of coordination: Secondary | ICD-10-CM

## 2019-12-23 DIAGNOSIS — G8929 Other chronic pain: Secondary | ICD-10-CM | POA: Diagnosis present

## 2019-12-23 DIAGNOSIS — M25511 Pain in right shoulder: Secondary | ICD-10-CM | POA: Diagnosis present

## 2019-12-23 DIAGNOSIS — M25611 Stiffness of right shoulder, not elsewhere classified: Secondary | ICD-10-CM | POA: Diagnosis present

## 2019-12-23 DIAGNOSIS — R29898 Other symptoms and signs involving the musculoskeletal system: Secondary | ICD-10-CM | POA: Diagnosis present

## 2019-12-23 NOTE — Therapy (Signed)
[facility] [address] [place], [place], [address] Phone: [phone]   Fax:  [phone]  Occupational Therapy Treatment  Patient Details  Name: Joan Webb MRN: [id number] Date of Birth: [birth year] Referring Provider (OT): Ihor Austin, NP   Encounter Date: 12/23/2019   OT End of Session - 12/23/19 1816    Visit Number 2    Number of Visits 16    Date for OT Re-Evaluation 02/04/20   mini-reassessment 01/03/20   Authorization Type [employer]    Authorization Time Period Not in [facility]-will request auth. Pt given information on switching to in-network plan, will be self-pay until authorized    OT Start Time 1740    OT Stop Time 1818    OT Time Calculation (min) 38 min    Activity Tolerance Patient tolerated treatment well    Behavior During Therapy [facility] for tasks assessed/performed           Past Medical History:  Diagnosis Date  . Anxiety   . Coronary artery disease   . Diabetes (HCC)   . Hyperlipidemia   . Myocardial infarction (HCC)   . Tobacco abuse     Past Surgical History:  Procedure Laterality Date  . CARDIAC CATHETERIZATION    . CORONARY ARTERY BYPASS GRAFT N/A 03/25/2019   Procedure: CORONARY ARTERY BYPASS GRAFTING (CABG) X THREE, ON PUMP, USING LEFT INTERNAL MAMMARY ARTERY AND LEFT GREATER SAPHENOUS VEIN HARVESTED ENDOSCOPICALLY;  Surgeon: Kerin Perna, MD;  Location: [facility] OR;  Service: Open Heart Surgery;  Laterality: N/A;  . LEFT HEART CATH AND CORONARY ANGIOGRAPHY N/A 03/23/2019   Procedure: LEFT HEART CATH AND CORONARY ANGIOGRAPHY;  Surgeon: Kathleene Hazel, MD;  [facility]: [facility] INVASIVE CV LAB;  Service: Cardiovascular;  Laterality: N/A;  . None    . TEE WITHOUT CARDIOVERSION N/A 03/25/2019   Procedure: TRANSESOPHAGEAL ECHOCARDIOGRAM (TEE);  Surgeon: Donata Clay, Theron Arista, MD;  Location: [facility] OR;  Service: Open Heart Surgery;  Laterality: N/A;    There were no vitals filed for this visit.    Subjective Assessment - 12/23/19 1751    Subjective  S: I am so tired. Just all the time. I could take a nap.    Pertinent History Pt is a 49 y/o female s/p CABG on 03/24/20 and CVA on 03/26/2020 presenting for evaluation of RUE weakness. Pt reports OT services at CIR and HHOT. Pt initially presented for services in 1/21 but did not have insurance therefore did not return for visits. Pt was referred to occupational therapy for evaluation and treatment by Ihor Austin, NP.    Currently in Pain? Yes    Pain Score 8     Pain Location Shoulder    Pain Orientation Right    Pain Descriptors / Indicators Aching;Sore    Pain Type Chronic pain                        OT Treatments/Exercises (OP) - 12/23/19 1752      Exercises   Exercises Theraputty;Hand;Wrist      Wrist Exercises   Other wrist exercises Yellow. Wrist flexion/extension to make cirlce with PVC. Also challenging grasp strength      Additional Wrist Exercises   Sponges Yellow resistence clothes pin with hard sponges. Making stacks of two; 6 stacks. x2 Moderate difficulty.      Theraputty   Theraputty - Flatten Yellow    Theraputty - Roll Yellow. Bilateral coorindation to roll into ball.  RUE to roll into worm.    Theraputty - Pinch Yellow. Index and thumb                    OT Short Term Goals - 12/23/19 1821      OT SHORT TERM GOAL #1   Title Pt will be provided with and educated on HEP to improve use of RUE during ADL completion.    Time 4    Period Weeks    Status On-going    Target Date 01/05/20      OT SHORT TERM GOAL #2   Title Pt will increase RUE strength to 4-/5 to improve ability to use RUE during dressing and bathing tasks.    Time 4    Period Weeks    Status On-going      OT SHORT TERM GOAL #3   Title Pt will increase RUE A/ROM to [facility] to improve ability to reach items in overhead cabinets.    Time 4    Period Weeks    Status On-going      OT SHORT TERM GOAL #4   Title Pt will  increase right grip strength by 12# and pinch strength by 3# to improve ability to hold eating utensils and feed herself.    Time 4    Period Weeks    Status New      OT SHORT TERM GOAL #5   Title Pt will increase right fine motor coordination by completing 9 hole peg test in under 45" to improve ability to perform grooming tasks such as brushing teeth or hair using RUE.    Time 4    Period Weeks    Status On-going             OT Long Term Goals - 12/23/19 1822      OT LONG TERM GOAL #1   Title Pt will increase RUE strength to 4/5 or greater to improve ability to lift and support grandchild.    Time 8    Period Weeks    Status On-going      OT LONG TERM GOAL #2   Title Pt will increase right grip strength by 25# and pinch strength by 5# to improve ability to grasp and maintain hold on items.    Time 8    Period Weeks    Status On-going      OT LONG TERM GOAL #3   Title Pt will increase RUE fine motor coordination by completing 9 hole peg test in 30" or less to improve ability to tie shoes.    Time 8    Period Weeks    Status On-going      OT LONG TERM GOAL #4   Title Pt will be educated on pain management techniques for neuropathy in her hands to improve ability to sleep at night.    Time 8    Period Weeks    Status On-going      OT LONG TERM GOAL #5   Title Pt will decrease fascial restrictions in RUE to improve mobility required for functional reaching tasks.    Time 8    Period Weeks    Status On-going                 Plan - 12/23/19 1801    Clinical Impression Statement A: Pt reporting she feels tired and weak. Starting with yellow theraputty for strengthening, pinch, and coorindation. PVC in theraputty with wrist ROM for  strengthening. Resistence pin for pinch strengthening. Cues throughout for form and tehcnique.    Plan P: RUE strengthening, grip and pinch strengthening, fine motor coordination tasks.    OT Home Exercise Plan eval: table slides             Patient will benefit from skilled therapeutic intervention in order to improve the following deficits and impairments:           Visit Diagnosis: Other lack of coordination  Other symptoms and signs involving the musculoskeletal system  Chronic right shoulder pain  Stiffness of right shoulder, not elsewhere classified    Problem List Patient Active Problem List   Diagnosis Date Noted  . Neuropathic pain of right hand 09/07/2019  . Tobacco abuse 07/18/2019  . Stroke (HCC) 04/27/2019  . Abnormality of gait 04/21/2019  . Thrombocytopenia (HCC)   . Acute deep vein thrombosis (DVT) of axillary vein of left upper extremity (HCC)   . Swelling   . Sleep disturbance   . Uncontrolled type 2 diabetes mellitus with hyperglycemia (HCC)   . Hypoalbuminemia due to protein-calorie malnutrition (HCC)   . Hyponatremia   . Labile blood glucose   . Spasticity   . Constipation   . Subcortical infarction (HCC) 03/30/2019  . Acute cerebral infarction (HCC)   . Acute blood loss anemia   . Class 1 obesity due to excess calories with serious comorbidity and body mass index (BMI) of 32.0 to 32.9 in adult   . Cognitive deficit, post-stroke   . Coronary artery disease involving native heart without angina pectoris   . Diabetic peripheral neuropathy (HCC)   . Essential hypertension   . 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 Acute ST elevation myocardial infarction (STEMI) of inferior wall (HCC) 03/23/2019  . ST elevation myocardial infarction (STEMI) of inferior wall (HCC)     Gabriel Rung, MSOT, OTR/L 12/23/2019, 6:17 PM  [facility] [address] [place], [place], [address] Phone: [phone]   Fax:  [phone]  Name: Joan Webb MRN: [id number] Date of Birth: 1971/03/24

## 2019-12-26 ENCOUNTER — Ambulatory Visit (HOSPITAL_COMMUNITY): Payer: Medicaid Other

## 2019-12-26 ENCOUNTER — Telehealth (HOSPITAL_COMMUNITY): Payer: Self-pay

## 2019-12-26 NOTE — Telephone Encounter (Signed)
Called patient regarding no show. Left message reminding her of next appointment and to call if unable to make it.   Limmie Patricia, OTR/L,CBIS  [phone]

## 2019-12-27 ENCOUNTER — Telehealth: Payer: Self-pay | Admitting: Cardiovascular Disease

## 2019-12-27 NOTE — Telephone Encounter (Signed)
1. What dental office are you calling from? [facility]  2. What is your office phone number? [phone]  3. What is your fax number? [phone]  4. What type of procedure is the patient having performed? extractions  5. What date is procedure scheduled or is the patient there now? 01/26/2020 (if the patient is at the dentist's office question goes to their cardiologist if he/she is in the office.  If not, question should go to the DOD).   6. What is your question (ex. Antibiotics prior to procedure, holding medication-we need to know how long dentist wants pt to hold med)? Priscilla from dentist office calling stating they need to know if the patient can have extractions, cleaning and anesthetics. She states they faxed a form to the office. She states they also need to know the patient's INR.

## 2019-12-27 NOTE — Telephone Encounter (Signed)
   Primary Cardiologist: Dr Sherlynn Stalls  Chart reviewed as part of pre-operative protocol coverage.    Simple dental extractions are considered low risk procedures per guidelines and generally do not require any specific cardiac clearance. It is also generally accepted that for simple extractions and dental cleanings, there is no need to interrupt blood thinner therapy.  SBE prophylaxis is not required for the patient.  I will route this recommendation to the requesting party via Epic fax function and remove from pre-op pool.  Please call with questions.  Corine Shelter, PA-C 12/27/2019, 4:12 PM

## 2019-12-28 ENCOUNTER — Ambulatory Visit (HOSPITAL_COMMUNITY): Payer: Medicaid Other

## 2019-12-28 ENCOUNTER — Telehealth: Payer: Self-pay

## 2019-12-28 NOTE — Telephone Encounter (Signed)
Erroneous encounter

## 2019-12-30 ENCOUNTER — Telehealth (HOSPITAL_COMMUNITY): Payer: Self-pay

## 2019-12-30 ENCOUNTER — Encounter (HOSPITAL_COMMUNITY): Payer: Medicaid Other

## 2019-12-30 NOTE — Telephone Encounter (Signed)
pt cancelled appt for today because she has a conflict in her schedule

## 2020-01-02 ENCOUNTER — Encounter (HOSPITAL_COMMUNITY): Payer: Self-pay

## 2020-01-02 ENCOUNTER — Other Ambulatory Visit: Payer: Self-pay

## 2020-01-02 ENCOUNTER — Ambulatory Visit (HOSPITAL_COMMUNITY): Payer: Medicaid Other

## 2020-01-02 DIAGNOSIS — R29898 Other symptoms and signs involving the musculoskeletal system: Secondary | ICD-10-CM | POA: Diagnosis not present

## 2020-01-02 DIAGNOSIS — G8929 Other chronic pain: Secondary | ICD-10-CM

## 2020-01-02 DIAGNOSIS — R278 Other lack of coordination: Secondary | ICD-10-CM

## 2020-01-02 DIAGNOSIS — M25611 Stiffness of right shoulder, not elsewhere classified: Secondary | ICD-10-CM

## 2020-01-02 NOTE — Patient Instructions (Signed)
Use heat on shoulder prior to these exercises. 5-10 minutes.   TABLE SLIDE - FLEXION  Sit in a chair and rest your injured arm on a table.  Start by leaning forward towards the table as you slide your arm forward as shown. Then, return to starting position and repeat. Complete 10 times.    SHOULDER FLEXION  AAROM - PALMS DOWN - Sitting  In a seated position, hold a wand/cane with both arms, palm down on both sides rested on pillow on table top.. Raise the wand/cane up allowing your unaffected arm to perform most of the effort. Your affected arm should be partially relaxed. Try to raise your arm to shoulder level. Control it as you come down to pillow.   Complete 10 times. 1 set   WAND PRESS - SITTING  Start by holding a wand or cane at chest height.  Next, slowly push the wand outwards in front of your body so that your elbows become fully straightened. Then, return to the original position. 10 times. 1 set    SHOULDER ABDUCTION - WAND / CANE  While holding a wand/cane palm face up on the injured side and palm face down on the uninjured side, slowly raise up your injured arm to the side. Lower back down and repeat. Complete 10 times.  1 set    After exercising, using an ice pack on your shoulder for 5-10 minutes may help with the pain.

## 2020-01-03 NOTE — Therapy (Signed)
[place] [facility] [address] [place], [place], [address] Phone: [phone]   Fax:  [phone]  Occupational Therapy Treatment  Patient Details  Name: Joan Webb MRN: [id number] Date of Birth: Sep 08, 1970 Referring Provider (OT): Ihor Austin, NP   Encounter Date: 01/02/2020   OT End of Session - 01/02/20 1701    Visit Number 3    Number of Visits 16    Date for OT Re-Evaluation 02/04/20   mini-reassessment 01/03/20   Authorization Type [employer] [place]    Authorization Time Period Not in [facility]-will request auth. Pt given information on switching to in-network plan, will be self-pay until authorized    OT Start Time 1648    OT Stop Time 1730    OT Time Calculation (min) 42 min    Activity Tolerance Patient tolerated treatment well    Behavior During Therapy [facility] for tasks assessed/performed           Past Medical History:  Diagnosis Date  . Anxiety   . Coronary artery disease   . Diabetes (HCC)   . Hyperlipidemia   . Myocardial infarction (HCC)   . Tobacco abuse     Past Surgical History:  Procedure Laterality Date  . CARDIAC CATHETERIZATION    . CORONARY ARTERY BYPASS GRAFT N/A 03/25/2019   Procedure: CORONARY ARTERY BYPASS GRAFTING (CABG) X THREE, ON PUMP, USING LEFT INTERNAL MAMMARY ARTERY AND LEFT GREATER SAPHENOUS VEIN HARVESTED ENDOSCOPICALLY;  Surgeon: Kerin Perna, MD;  Location: [facility] OR;  Service: Open Heart Surgery;  Laterality: N/A;  . LEFT HEART CATH AND CORONARY ANGIOGRAPHY N/A 03/23/2019   Procedure: LEFT HEART CATH AND CORONARY ANGIOGRAPHY;  Surgeon: Kathleene Hazel, MD;  Location: [facility] INVASIVE CV LAB;  Service: Cardiovascular;  Laterality: N/A;  . None    . TEE WITHOUT CARDIOVERSION N/A 03/25/2019   Procedure: TRANSESOPHAGEAL ECHOCARDIOGRAM (TEE);  Surgeon: Donata Clay, Theron Arista, MD;  Location: [facility] OR;  Service: Open Heart Surgery;  Laterality: N/A;    There were no vitals filed for this  visit.   Subjective Assessment - 01/02/20 1655    Subjective  S: I just want to be able to use this right arm to do things.    Currently in Pain? Yes    Pain Score 6     Pain Location Shoulder    Pain Orientation Right    Pain Descriptors / Indicators Aching;Sore    Pain Type Acute pain    Pain Radiating Towards Bicep to shoulder    Pain Onset More than a month ago    Pain Frequency Constant    Aggravating Factors  sleeping    Pain Relieving Factors ice, heat    Effect of Pain on Daily Activities mod-max effect              [facility] OT Assessment - 01/02/20 1701      Assessment   Medical Diagnosis RUE weakness s/p CVA      Precautions   Precautions Other (comment)    Precaution Comments s/p CABG on 03/25/2019                    OT Treatments/Exercises (OP) - 01/02/20 1702      Exercises   Exercises Shoulder      Shoulder Exercises: Seated   Protraction AAROM;10 reps    Protraction Limitations Start position with PVC pipe resting on table.     Flexion AAROM;10 reps    Flexion Limitations Start position  with PVC pipe resting on table.  Lift PVC pipe to shoulder level if possible.    Abduction AAROM;10 reps      Modalities   Modalities Moist Heat      Moist Heat Therapy   Number Minutes Moist Heat 5 Minutes    Moist Heat Location Shoulder;Cervical                  OT Education - 01/02/20 1719    Education Details Shoulder AA/ROM flexion, abduction, protraction seated. Table slide- flexion. Yellow theraputty- squeeze 2-3 minutes. pinch 2-3 minutes.    Person(s) Educated Patient    Methods Explanation;Demonstration;Verbal cues;Handout    Comprehension Returned demonstration;Verbalized understanding            OT Short Term Goals - 12/23/19 1821      OT SHORT TERM GOAL #1   Title Pt will be provided with and educated on HEP to improve use of RUE during ADL completion.    Time 4    Period Weeks    Status On-going    Target Date 01/05/20       OT SHORT TERM GOAL #2   Title Pt will increase RUE strength to 4-/5 to improve ability to use RUE during dressing and bathing tasks.    Time 4    Period Weeks    Status On-going      OT SHORT TERM GOAL #3   Title Pt will increase RUE A/ROM to [facility] to improve ability to reach items in overhead cabinets.    Time 4    Period Weeks    Status On-going      OT SHORT TERM GOAL #4   Title Pt will increase right grip strength by 12# and pinch strength by 3# to improve ability to hold eating utensils and feed herself.    Time 4    Period Weeks    Status New      OT SHORT TERM GOAL #5   Title Pt will increase right fine motor coordination by completing 9 hole peg test in under 45" to improve ability to perform grooming tasks such as brushing teeth or hair using RUE.    Time 4    Period Weeks    Status On-going             OT Long Term Goals - 12/23/19 1822      OT LONG TERM GOAL #1   Title Pt will increase RUE strength to 4/5 or greater to improve ability to lift and support grandchild.    Time 8    Period Weeks    Status On-going      OT LONG TERM GOAL #2   Title Pt will increase right grip strength by 25# and pinch strength by 5# to improve ability to grasp and maintain hold on items.    Time 8    Period Weeks    Status On-going      OT LONG TERM GOAL #3   Title Pt will increase RUE fine motor coordination by completing 9 hole peg test in 30" or less to improve ability to tie shoes.    Time 8    Period Weeks    Status On-going      OT LONG TERM GOAL #4   Title Pt will be educated on pain management techniques for neuropathy in her hands to improve ability to sleep at night.    Time 8    Period Weeks  Status On-going      OT LONG TERM GOAL #5   Title Pt will decrease fascial restrictions in RUE to improve mobility required for functional reaching tasks.    Time 8    Period Weeks    Status On-going                 Plan - 01/03/20 0847    Clinical  Impression Statement A: Focused on pain management initially using moist heat along right shoulder and bicep region prior to seated AA/ROM with HEP updated. VC for form and technique provided. Provided yellow theraputty for HEP as well for grip and pinch strengthening. Educated patient to continue to complete basic daily tasks to increase strength and use of RUE.    Body Structure / Function / Physical Skills ADL;Endurance;UE functional use;Fascial restriction;Pain;FMC;ROM;IADL;Strength    Plan P: Mini reassessment. Follow up on HEP. Continue with RUE strengthening and pain management as needed.    OT Home Exercise Plan eval: table slides 8/16: yellow theraputty, AA/ROM shoulder seated- flexion, protraction, abduction    Consulted and Agree with Plan of Care Patient           Patient will benefit from skilled therapeutic intervention in order to improve the following deficits and impairments:   Body Structure / Function / Physical Skills: ADL, Endurance, UE functional use, Fascial restriction, Pain, FMC, ROM, IADL, Strength       Visit Diagnosis: Other lack of coordination  Other symptoms and signs involving the musculoskeletal system  Chronic right shoulder pain  Stiffness of right shoulder, not elsewhere classified    Problem List Patient Active Problem List   Diagnosis Date Noted  . Neuropathic pain of right hand 09/07/2019  . Tobacco abuse 07/18/2019  . Stroke (HCC) 04/27/2019  . Abnormality of gait 04/21/2019  . Thrombocytopenia (HCC)   . Acute deep vein thrombosis (DVT) of axillary vein of left upper extremity (HCC)   . Swelling   . Sleep disturbance   . Uncontrolled type 2 diabetes mellitus with hyperglycemia (HCC)   . Hypoalbuminemia due to protein-calorie malnutrition (HCC)   . Hyponatremia   . Labile blood glucose   . Spasticity   . Constipation   . Subcortical infarction (HCC) 03/30/2019  . Acute cerebral infarction (HCC)   . Acute blood loss anemia   . Class  1 obesity due to excess calories with serious comorbidity and body mass index (BMI) of 32.0 to 32.9 in adult   . Cognitive deficit, post-stroke   . Coronary artery disease involving native heart without angina pectoris   . Diabetic peripheral neuropathy (HCC)   . Essential hypertension   . 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 Acute ST elevation myocardial infarction (STEMI) of inferior wall (HCC) 03/23/2019  . ST elevation myocardial infarction (STEMI) of inferior wall [facility])    Limmie Patricia, OTR/L,CBIS  [phone]  01/03/2020, 9:10 AM  [place] [facility] [address] [place], [place], [address] Phone: [phone]   Fax:  [phone]  Name: Joan Webb MRN: [id number] Date of Birth: May 19, 1971

## 2020-01-04 ENCOUNTER — Encounter (HOSPITAL_COMMUNITY): Payer: Medicaid Other

## 2020-01-05 ENCOUNTER — Encounter (HOSPITAL_COMMUNITY): Payer: Self-pay

## 2020-01-05 ENCOUNTER — Other Ambulatory Visit: Payer: Self-pay

## 2020-01-05 ENCOUNTER — Ambulatory Visit (HOSPITAL_COMMUNITY): Payer: Medicaid Other

## 2020-01-05 DIAGNOSIS — G8929 Other chronic pain: Secondary | ICD-10-CM

## 2020-01-05 DIAGNOSIS — R278 Other lack of coordination: Secondary | ICD-10-CM

## 2020-01-05 DIAGNOSIS — M25611 Stiffness of right shoulder, not elsewhere classified: Secondary | ICD-10-CM

## 2020-01-05 DIAGNOSIS — R29898 Other symptoms and signs involving the musculoskeletal system: Secondary | ICD-10-CM

## 2020-01-05 NOTE — Therapy (Signed)
[facility] [address] [place], [place], [address] Phone: [phone]   Fax:  [phone]  Patient Details  Name: Joan Webb MRN: [id number] Date of Birth: 1970-10-24 Referring Provider:  Ihor Austin, NP  Encounter Date: 01/05/2020  Patient arrived to therapy extremely fatigued, eyes heavy, and slow speech. Pt reports that she was having a rough day and did not sleep well. Pt was unable to participate in any functional activity that would have been beneficial. Discussed that with her body this fatigued and unable to complete the activities in therapy, it was more harmful than beneficial to her. Therapy session was discontinued. Patient was told to go home and rest/sleep. If her medical issues continue to impede her participation in therapy we may need to look at holding therapy temporarily until they are resolved. Patient verbalized understanding. Reminder given for next therapy appointment.     Limmie Patricia, OTR/L,CBIS  [phone]  01/05/2020, 5:59 PM  [place] [facility] [address] [place], [place], [address] Phone: [phone]   Fax:  [phone]

## 2020-01-09 ENCOUNTER — Encounter (HOSPITAL_COMMUNITY): Payer: Medicaid Other

## 2020-01-11 ENCOUNTER — Encounter (HOSPITAL_COMMUNITY): Payer: Medicaid Other

## 2020-01-11 ENCOUNTER — Telehealth (HOSPITAL_COMMUNITY): Payer: Self-pay

## 2020-01-11 NOTE — Telephone Encounter (Signed)
pt cancelled appt for today because she is not feeling well 

## 2020-01-16 ENCOUNTER — Ambulatory Visit: Payer: Self-pay | Admitting: Diagnostic Neuroimaging

## 2020-01-16 ENCOUNTER — Ambulatory Visit (HOSPITAL_COMMUNITY): Payer: Medicaid Other

## 2020-01-17 ENCOUNTER — Ambulatory Visit: Payer: Self-pay | Admitting: Family Medicine

## 2020-01-17 ENCOUNTER — Encounter: Payer: Self-pay | Admitting: Family Medicine

## 2020-01-18 ENCOUNTER — Other Ambulatory Visit: Payer: Self-pay

## 2020-01-18 ENCOUNTER — Encounter (HOSPITAL_COMMUNITY): Payer: Self-pay | Admitting: Occupational Therapy

## 2020-01-18 ENCOUNTER — Ambulatory Visit (HOSPITAL_COMMUNITY): Payer: Medicaid Other | Attending: Adult Health | Admitting: Occupational Therapy

## 2020-01-18 DIAGNOSIS — R29898 Other symptoms and signs involving the musculoskeletal system: Secondary | ICD-10-CM | POA: Diagnosis not present

## 2020-01-18 DIAGNOSIS — R278 Other lack of coordination: Secondary | ICD-10-CM | POA: Insufficient documentation

## 2020-01-18 DIAGNOSIS — M25511 Pain in right shoulder: Secondary | ICD-10-CM | POA: Diagnosis present

## 2020-01-18 DIAGNOSIS — G8929 Other chronic pain: Secondary | ICD-10-CM

## 2020-01-18 DIAGNOSIS — M25611 Stiffness of right shoulder, not elsewhere classified: Secondary | ICD-10-CM | POA: Insufficient documentation

## 2020-01-18 NOTE — Therapy (Signed)
[place], [place], [address] Phone: [phone]   Fax:  [phone]  Occupational Therapy Treatment (mini-reassessment)  Patient Details  Name: Joan Webb MRN: [id number] Date of Birth: [birth year] Referring Provider (OT): Frann Rider, NP   Encounter Date: 01/18/2020   OT End of Session - 01/18/20 1730    Visit Number 5    Number of Visits 16    Date for OT Re-Evaluation 02/04/20    Authorization Type [employer]    Authorization Time Period Not in [facility]-will request auth. Pt given information on switching to in-network plan, will be self-pay until authorized    OT Start Time 1704   pt arrived late   OT Stop Time 1732    OT Time Calculation (min) 28 min    Activity Tolerance Patient tolerated treatment well    Behavior During Therapy [facility] for tasks assessed/performed           Past Medical History:  Diagnosis Date  . Anxiety   . Coronary artery disease   . Diabetes ([place])   . Hyperlipidemia   . Myocardial infarction ([place])   . Tobacco abuse     Past Surgical History:  Procedure Laterality Date  . CARDIAC CATHETERIZATION    . CORONARY ARTERY BYPASS GRAFT N/A 03/25/2019   Procedure: CORONARY ARTERY BYPASS GRAFTING (CABG) X THREE, ON PUMP, USING LEFT INTERNAL MAMMARY ARTERY AND LEFT GREATER SAPHENOUS VEIN HARVESTED ENDOSCOPICALLY;  Surgeon: Ivin Poot, MD;  Location: [place];  Service: Open Heart Surgery;  Laterality: N/A;  . LEFT HEART CATH AND CORONARY ANGIOGRAPHY N/A 03/23/2019   Procedure: LEFT HEART CATH AND CORONARY ANGIOGRAPHY;  Surgeon: Burnell Blanks, MD;  Location: [place] CV LAB;  Service: Cardiovascular;  Laterality: N/A;  . None    . TEE WITHOUT CARDIOVERSION N/A 03/25/2019   Procedure: TRANSESOPHAGEAL ECHOCARDIOGRAM (TEE);  Surgeon: Prescott Gum, Collier Salina, MD;  Location: [place];  Service: Open Heart Surgery;  Laterality: N/A;    There were no vitals filed for this  visit.   Subjective Assessment - 01/18/20 1706    Subjective  S: I have a PCP now and am on some different medications.    Currently in Pain? No/denies              [facility] OT Assessment - 01/18/20 1706      Assessment   Medical Diagnosis RUE weakness s/p CVA      Precautions   Precautions Other (comment)    Precaution Comments s/p CABG on 03/25/2019      Coordination   9 Hole Peg Test Right    Right 9 Hole Peg Test 1' 38"   53.16" previous     Palpation   Palpation comment mod to max fascial restrictions in right upper arm, trapezius, and scapular regions      AROM   Overall AROM Comments Assessed seated, er/IR adducted. Elbow and wrist are WNL    AROM Assessment Site Shoulder    Right/Left Shoulder Right    Right Shoulder Flexion 70 Degrees   76 previous   Right Shoulder ABduction 80 Degrees   58 previous   Right Shoulder Internal Rotation 90 Degrees   same as previous   Right Shoulder External Rotation 25 Degrees   18 previous     Strength   Strength Assessment Site Shoulder;Elbow;Forearm;Wrist;Hand    Right/Left Shoulder Right    Right Shoulder Flexion 3-/5   same as previous  Right Shoulder ABduction 3-/5   same as previous   Right Shoulder Internal Rotation 4-/5   same as previous   Right Shoulder External Rotation 3/5   same as previous   Right/Left Elbow Right    Right Elbow Flexion 4/5   3/5 previous   Right Elbow Extension 4/5   3+/5 previous   Right/Left Forearm Right    Right Forearm Pronation 3/5   same as previous   Right Forearm Supination 3/5   same as previous   Right/Left Wrist Right    Right Wrist Flexion 3+/5   same as previous   Right Wrist Extension 3/5   same as previous   Right Wrist Radial Deviation 3/5   same as previous   Right Wrist Ulnar Deviation 3/5   same as previous   Right/Left hand Right    Right Hand Grip (lbs) 10   5 previous   Right Hand Lateral Pinch 3 lbs   previous 1   Right Hand 3 Point Pinch 5 lbs   2 previous                    OT Treatments/Exercises (OP) - 01/18/20 1722      Exercises   Exercises Shoulder      Shoulder Exercises: Seated   Protraction AAROM;10 reps    Horizontal ABduction AAROM;10 reps    External Rotation AAROM;10 reps    Internal Rotation AAROM;10 reps    Flexion AAROM;10 reps                    OT Short Term Goals - 12/23/19 1821      OT SHORT TERM GOAL #1   Title Pt will be provided with and educated on HEP to improve use of RUE during ADL completion.    Time 4    Period Weeks    Status On-going    Target Date 01/05/20      OT SHORT TERM GOAL #2   Title Pt will increase RUE strength to 4-/5 to improve ability to use RUE during dressing and bathing tasks.    Time 4    Period Weeks    Status On-going      OT SHORT TERM GOAL #3   Title Pt will increase RUE A/ROM to [facility] to improve ability to reach items in overhead cabinets.    Time 4    Period Weeks    Status On-going      OT SHORT TERM GOAL #4   Title Pt will increase right grip strength by 12# and pinch strength by 3# to improve ability to hold eating utensils and feed herself.    Time 4    Period Weeks    Status New      OT SHORT TERM GOAL #5   Title Pt will increase right fine motor coordination by completing 9 hole peg test in under 45" to improve ability to perform grooming tasks such as brushing teeth or hair using RUE.    Time 4    Period Weeks    Status On-going             OT Long Term Goals - 12/23/19 1822      OT LONG TERM GOAL #1   Title Pt will increase RUE strength to 4/5 or greater to improve ability to lift and support grandchild.    Time 8    Period Weeks    Status On-going  OT LONG TERM GOAL #2   Title Pt will increase right grip strength by 25# and pinch strength by 5# to improve ability to grasp and maintain hold on items.    Time 8    Period Weeks    Status On-going      OT LONG TERM GOAL #3   Title Pt will increase RUE fine motor coordination by  completing 9 hole peg test in 30" or less to improve ability to tie shoes.    Time 8    Period Weeks    Status On-going      OT LONG TERM GOAL #4   Title Pt will be educated on pain management techniques for neuropathy in her hands to improve ability to sleep at night.    Time 8    Period Weeks    Status On-going      OT LONG TERM GOAL #5   Title Pt will decrease fascial restrictions in RUE to improve mobility required for functional reaching tasks.    Time 8    Period Weeks    Status On-going                 Plan - 01/18/20 1726    Clinical Impression Statement A: Mini-reassessment completed this session. Pt has made minimal progress and has not met any goals due to lack of attendance and carry over at home. Educated pt on necessity of attending therapy and completing HEP in order for progress to be made. Pt requiring mod to max redirection to tasks today due to easily distracted. Verbal cuing for form and technique today.    Body Structure / Function / Physical Skills ADL;Endurance;UE functional use;Fascial restriction;Pain;FMC;ROM;IADL;Strength    Plan P: begin manual therapy on RUE for pain management and to address fascial restrictions, continue with RUE ROM and functional reaching tasks. Grip strengthening    OT Home Exercise Plan eval: table slides 8/16: yellow theraputty, AA/ROM shoulder seated- flexion, protraction, abduction    Consulted and Agree with Plan of Care Patient           Patient will benefit from skilled therapeutic intervention in order to improve the following deficits and impairments:   Body Structure / Function / Physical Skills: ADL, Endurance, UE functional use, Fascial restriction, Pain, FMC, ROM, IADL, Strength       Visit Diagnosis: Other symptoms and signs involving the musculoskeletal system  Other lack of coordination  Chronic right shoulder pain  Stiffness of right shoulder, not elsewhere classified    Problem List Patient  Active Problem List   Diagnosis Date Noted  . Neuropathic pain of right hand 09/07/2019  . Tobacco abuse 07/18/2019  . Stroke ([place]) 04/27/2019  . Abnormality of gait 04/21/2019  . Thrombocytopenia ([place])   . Acute deep vein thrombosis (DVT) of axillary vein of left upper extremity ([place])   . Swelling   . Sleep disturbance   . Uncontrolled type 2 diabetes mellitus with hyperglycemia ([place])   . Hypoalbuminemia due to protein-calorie malnutrition ([place])   . Hyponatremia   . Labile blood glucose   . Spasticity   . Constipation   . Subcortical infarction ([place]) 03/30/2019  . Acute cerebral infarction ([place])   . Acute blood loss anemia   . Class 1 obesity due to excess calories with serious comorbidity and body mass index (BMI) of 32.0 to 32.9 in adult   . Cognitive deficit, post-stroke   . Coronary artery disease involving native heart without angina pectoris   .  Diabetic peripheral neuropathy ([place])   . Essential hypertension   . 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 Acute ST elevation myocardial infarction (STEMI) of inferior wall ([place]) 03/23/2019  . ST elevation myocardial infarction (STEMI) of inferior wall [facility])    Guadelupe Sabin, OTR/L  [phone] 01/18/2020, 5:32 PM  [place] [address] [place], [place], [address] Phone: [phone]   Fax:  [phone]  Name: Aalivia Mcgraw MRN: [id number] Date of Birth: 1970/05/23

## 2020-01-25 ENCOUNTER — Ambulatory Visit (HOSPITAL_COMMUNITY): Payer: Medicaid Other | Admitting: Occupational Therapy

## 2020-01-27 ENCOUNTER — Ambulatory Visit (HOSPITAL_COMMUNITY): Payer: Medicaid Other | Admitting: Occupational Therapy

## 2020-01-27 ENCOUNTER — Telehealth (HOSPITAL_COMMUNITY): Payer: Self-pay | Admitting: Occupational Therapy

## 2020-01-27 NOTE — Telephone Encounter (Signed)
Pt arrived 30 mins late for appt. Discussed difficulty with transportation and attendance. Pt provided with HEP for A/ROM and fine motor strengthening and coordination. Reports she will be at next appt and will discuss HEP and upcoming reassessment/discharge at that time.    Ezra Sites, OTR/L  [phone] 01/27/20

## 2020-01-30 ENCOUNTER — Ambulatory Visit (HOSPITAL_COMMUNITY): Payer: Medicaid Other

## 2020-01-30 ENCOUNTER — Encounter (HOSPITAL_COMMUNITY): Payer: Self-pay

## 2020-01-30 ENCOUNTER — Other Ambulatory Visit: Payer: Self-pay

## 2020-01-30 DIAGNOSIS — R29898 Other symptoms and signs involving the musculoskeletal system: Secondary | ICD-10-CM | POA: Diagnosis not present

## 2020-01-30 DIAGNOSIS — M25511 Pain in right shoulder: Secondary | ICD-10-CM

## 2020-01-30 DIAGNOSIS — R278 Other lack of coordination: Secondary | ICD-10-CM

## 2020-01-30 DIAGNOSIS — M25611 Stiffness of right shoulder, not elsewhere classified: Secondary | ICD-10-CM

## 2020-01-31 NOTE — Therapy (Signed)
[facility] [address] [place], [place], [address] Phone: [phone]   Fax:  [phone]  Occupational Therapy Treatment  Patient Details  Name: Joan Webb MRN: [id number] Date of Birth: September 05, 1970 Referring Provider (OT): Ihor Austin, NP   Encounter Date: 01/30/2020   OT End of Session - 01/30/20 1746    Visit Number 6    Number of Visits 16    Date for OT Re-Evaluation 02/04/20    Authorization Type [employer]    Authorization Time Period Not in [facility]-will request auth. Pt given information on switching to in-network plan, will be self-pay until authorized    OT Start Time 1645    OT Stop Time 1723    OT Time Calculation (min) 38 min    Activity Tolerance Patient tolerated treatment well    Behavior During Therapy [facility] for tasks assessed/performed           Past Medical History:  Diagnosis Date  . Anxiety   . Coronary artery disease   . Diabetes (HCC)   . Hyperlipidemia   . Myocardial infarction (HCC)   . Tobacco abuse     Past Surgical History:  Procedure Laterality Date  . CARDIAC CATHETERIZATION    . CORONARY ARTERY BYPASS GRAFT N/A 03/25/2019   Procedure: CORONARY ARTERY BYPASS GRAFTING (CABG) X THREE, ON PUMP, USING LEFT INTERNAL MAMMARY ARTERY AND LEFT GREATER SAPHENOUS VEIN HARVESTED ENDOSCOPICALLY;  Surgeon: Kerin Perna, MD;  Location: [facility] OR;  Service: Open Heart Surgery;  Laterality: N/A;  . LEFT HEART CATH AND CORONARY ANGIOGRAPHY N/A 03/23/2019   Procedure: LEFT HEART CATH AND CORONARY ANGIOGRAPHY;  Surgeon: Kathleene Hazel, MD;  [facility] INVAS[facility] CV LAB;  Service: Cardiovascular;  Laterality: N/A;  . None    . TEE WITHOUT CARDIOVERSION N/A 03/25/2019   Procedure: TRANSESOPHAGEAL ECHOCARDIOGRAM (TEE);  Surgeon: Donata Clay, Theron Arista, MD;  Location[facility] OR;  Service: Open Heart Surgery;  Laterality: N/A;    There were no vitals filed for this visit.   Subjective Assessment -  01/30/20 1711    Subjective  S: I think I have overdid it.    Currently in Pain? Yes    Pain Score 6     Pain Location Arm    Pain Orientation Right    Pain Descriptors / Indicators Aching;Tightness    Pain Type Acute pain    Pain Radiating Towards stays in the bicep    Pain Onset More than a month ago    Pain Frequency Constant    Aggravating Factors  trying to sleep with arm under pillow on back, increased use    Pain Relieving Factors pain medication, ice, heat    Effect of Pain on Daily Activities mod-max effect    Multiple Pain Sites No              [facility] OT Assessment - 01/30/20 1715      Assessment   Medical Diagnosis RUE weakness s/p CVA      Precautions   Precautions Other (comment)    Precaution Comments s/p CABG on 03/25/2019                    OT Treatments/Exercises (OP) - 01/30/20 1715      Exercises   Exercises Shoulder      Shoulder Exercises: Supine   Protraction PROM;5 reps;AAROM;10 reps    Horizontal ABduction PROM;5 reps    External Rotation PROM;5 reps;AAROM;10 reps  Internal Rotation PROM;5 reps;AAROM;10 reps    Flexion PROM;5 reps;AAROM;10 reps    ABduction PROM;5 reps      Shoulder Exercises: Therapy Ball   Flexion 10 reps    ABduction 10 reps      Manual Therapy   Manual Therapy Soft tissue mobilization    Manual therapy comments Manual therapy completed prior to exercises    Soft tissue mobilization Soft tissue mobilization completed to right upper arm, trapezius, and scapularis region to decrease fascial restrictions and increase joint mobility in a pain free zone.                   OT Education - 01/30/20 1739    Education Details Discussed completing AA/ROM supine versus seated if it allow for more comfort and better range. Discussed typical occurance of shoulder pain post CVA and ways to treat/manage it with use of Ice, heat, ES, Botox to address tightness.    Person(s) Educated Patient    Methods Explanation     Comprehension Verbalized understanding            OT Short Term Goals - 01/31/20 1342      OT SHORT TERM GOAL #1   Title Pt will be provided with and educated on HEP to improve use of RUE during ADL completion.    Time 4    Period Weeks    Status On-going    Target Date 01/05/20      OT SHORT TERM GOAL #2   Title Pt will increase RUE strength to 4-/5 to improve ability to use RUE during dressing and bathing tasks.    Time 4    Period Weeks    Status On-going      OT SHORT TERM GOAL #3   Title Pt will increase RUE A/ROM to [facility] to improve ability to reach items in overhead cabinets.    Time 4    Period Weeks    Status On-going      OT SHORT TERM GOAL #4   Title Pt will increase right grip strength by 12# and pinch strength by 3# to improve ability to hold eating utensils and feed herself.    Time 4    Period Weeks    Status On-going      OT SHORT TERM GOAL #5   Title Pt will increase right fine motor coordination by completing 9 hole peg test in under 45" to improve ability to perform grooming tasks such as brushing teeth or hair using RUE.    Time 4    Period Weeks    Status On-going             OT Long Term Goals - 12/23/19 1822      OT LONG TERM GOAL #1   Title Pt will increase RUE strength to 4/5 or greater to improve ability to lift and support grandchild.    Time 8    Period Weeks    Status On-going      OT LONG TERM GOAL #2   Title Pt will increase right grip strength by 25# and pinch strength by 5# to improve ability to grasp and maintain hold on items.    Time 8    Period Weeks    Status On-going      OT LONG TERM GOAL #3   Title Pt will increase RUE fine motor coordination by completing 9 hole peg test in 30" or less to improve ability to tie shoes.  Time 8    Period Weeks    Status On-going      OT LONG TERM GOAL #4   Title Pt will be educated on pain management techniques for neuropathy in her hands to improve ability to sleep at night.     Time 8    Period Weeks    Status On-going      OT LONG TERM GOAL #5   Title Pt will decrease fascial restrictions in RUE to improve mobility required for functional reaching tasks.    Time 8    Period Weeks    Status On-going                 Plan - 01/31/20 1336    Clinical Impression Statement A: Focused on RUE pain this session while completing myofascial release and manual stretching supine. Discussed occurance of arm pain post CVA and techniques to manage. Pt continued to experience increased pain the [facility] joint regardless of additional joint stabilization.    Body Structure / Function / Physical Skills ADL;Endurance;UE functional use;Fascial restriction;Pain;FMC;ROM;IADL;Strength    Plan P: reassessment and possible discharge with HEP. Kinsiotape for right shoulder and cervical pain.    Consulted and Agree with Plan of Care Patient           Patient will benefit from skilled therapeutic intervention in order to improve the following deficits and impairments:   Body Structure / Function / Physical Skills: ADL, Endurance, UE functional use, Fascial restriction, Pain, FMC, ROM, IADL, Strength       Visit Diagnosis: Other symptoms and signs involving the musculoskeletal system  Other lack of coordination  Chronic right shoulder pain  Stiffness of right shoulder, not elsewhere classified    Problem List Patient Active Problem List   Diagnosis Date Noted  . Neuropathic pain of right hand 09/07/2019  . Tobacco abuse 07/18/2019  . Stroke (HCC) 04/27/2019  . Abnormality of gait 04/21/2019  . Thrombocytopenia (HCC)   . Acute deep vein thrombosis (DVT) of axillary vein of left upper extremity (HCC)   . Swelling   . Sleep disturbance   . Uncontrolled type 2 diabetes mellitus with hyperglycemia (HCC)   . Hypoalbuminemia due to protein-calorie malnutrition (HCC)   . Hyponatremia   . Labile blood glucose   . Spasticity   . Constipation   . Subcortical infarction  (HCC) 03/30/2019  . Acute cerebral infarction (HCC)   . Acute blood loss anemia   . Class 1 obesity due to excess calories with serious comorbidity and body mass index (BMI) of 32.0 to 32.9 in adult   . Cognitive deficit, post-stroke   . Coronary artery disease involving native heart without angina pectoris   . Diabetic peripheral neuropathy (HCC)   . Essential hypertension   . 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 Acute ST elevation myocardial infarction (STEMI) of inferior wall (HCC) 03/23/2019  . ST elevation myocardial infarction (STEMI) of inferior wall [facility])    Limmie Patricia, OTR/L,CBIS  [phone]  01/31/2020, 1:43 PM  [facility] [address] [place], [place], [address] Phone: [phone]   Fax:  [phone]  Name: Joan Webb MRN: [id number] Date of Birth: 08-Nov-1970

## 2020-02-01 ENCOUNTER — Ambulatory Visit (HOSPITAL_COMMUNITY): Payer: Medicaid Other

## 2020-02-03 ENCOUNTER — Telehealth (INDEPENDENT_AMBULATORY_CARE_PROVIDER_SITE_OTHER): Payer: Medicaid Other | Admitting: Cardiovascular Disease

## 2020-02-03 ENCOUNTER — Encounter: Payer: Self-pay | Admitting: Cardiovascular Disease

## 2020-02-03 VITALS — Ht 62.0 in | Wt 190.0 lb

## 2020-02-03 DIAGNOSIS — I2510 Atherosclerotic heart disease of native coronary artery without angina pectoris: Secondary | ICD-10-CM | POA: Diagnosis not present

## 2020-02-03 NOTE — Patient Instructions (Signed)

## 2020-02-03 NOTE — Progress Notes (Signed)
Virtual Visit via Video Note   This visit type was conducted due to national recommendations for restrictions regarding the COVID-19 Pandemic (e.g. social distancing) in an effort to limit this patient's exposure and mitigate transmission in our community.  Due to her co-morbid illnesses, this patient is at least at moderate risk for complications without adequate follow up.  This format is felt to be most appropriate for this patient at this time.  All issues noted in this document were discussed and addressed.  A limited physical exam was performed with this format.  Please refer to the patient's chart for her consent to telehealth for [facility]  Date:  02/03/2020   ID:  Joan Webb, DOB 1970-10-16, MRN [id number]  Patient Location: Home Provider Location: Office/Clinic  PCP:  Patient, No Pcp Per  Cardiologist:  Verne Carrow, MD  Electrophysiologist:  None   Evaluation Performed:  Follow-Up Visit  Chief Complaint:  Follow up- CAD History of Present Illness: 49 yo female with history of CAD, HLD, DM, postoperative CVA and tobacco abuse who is being seen today by virtual visit. She called in today just before her appointmnent time and told us she would be 40 minutes late. She was changed to a virtual visit.  She was admitted to [facility] 03/23/19 with an acute inferior ST elevation MI. Emergent cardiac cath 03/23/19 with multi-vessel CAD. She underwent 3V CABG (LIMA to LAD, SVG to Diagonal, SVG to PDA) on 03/25/19. Echo 03/24/19 with LVEF=60-65% without significant valve disease. She unfortunately suffered a CVA during the post-operative period and was discharged to inpatient rehab. 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 Plavix was stopped  and she was started on Eliquis. She was discharged home on 04/08/19. She was seen in our office January 2021 and was doing well.   She is here today for follow up. The patient  denies any chest pain, dyspnea, palpitations, lower extremity edema, orthopnea, PND, dizziness, near syncope or syncope. She is still recovering from her stroke but getting more of her strength back. She is still smoking  Primary Care Physician: Patient, No Pcp Per  Past Medical History:  Diagnosis Date  . Anxiety   . Coronary artery disease   . Diabetes (HCC)   . Hyperlipidemia   . Myocardial infarction (HCC)   . Tobacco abuse     Past Surgical History:  Procedure Laterality Date  . CARDIAC CATHETERIZATION    . CORONARY ARTERY BYPASS GRAFT N/A 03/25/2019   Procedure: CORONARY ARTERY BYPASS GRAFTING (CABG) X THREE, ON PUMP, USING LEFT INTERNAL MAMMARY ARTERY AND LEFT GREATER SAPHENOUS VEIN HARVESTED ENDOSCOPICALLY;  Surgeon: Kerin Perna, MD;  Location: [facility] OR;  Service: Open Heart Surgery;  Laterality: N/A;  . LEFT HEART CATH AND CORONARY ANGIOGRAPHY N/A 03/23/2019   Procedure: LEFT HEART CATH AND CORONARY ANGIOGRAPHY;  Surgeon: Kathleene Hazel, MD;  [facility] INV[facility] CV LAB;  Service: Cardiovascular;  Laterality: N/A;  . None    . TEE WITHOUT CARDIOVERSION N/A 03/25/2019   Procedure: TRANSESOPHAGEAL ECHOCARDIOGRAM (TEE);  Surgeon: Donata Clay, Theron Arista, MD;  Location: [facility] OR;  Service: Open Heart Surgery;  Laterality: N/A;    Current Outpatient Medications  Medication Sig Dispense Refill  . acetaminophen (TYLENOL) 325 MG tablet Take 1-2 tablets (325-650 mg total) by mouth every 4 (four) hours as needed for mild pain.    Marland Kitchen amitriptyline (ELAVIL) 10 MG tablet  Take 1 tablet (10 mg total) by mouth at bedtime. 30 tablet 1  . aspirin EC 81 MG EC tablet Take 1 tablet (81 mg total) by mouth daily.    Marland Kitchen atorvastatin (LIPITOR) 80 MG tablet Take 1 tablet (80 mg total) by mouth daily at 6 PM. 30 tablet 1  . Cholecalciferol (QC VITAMIN D3) 125 MCG (5000 UT) TABS Take 1 tablet (5,000 Units total) by mouth daily. 30 tablet 1  . citalopram (CELEXA) 40 MG tablet Take 40 mg by mouth daily.      . clopidogrel (PLAVIX) 75 MG tablet Take 1 tablet (75 mg total) by mouth daily. 90 tablet 3  . gabapentin (NEURONTIN) 600 MG tablet Take 1 tablet (600 mg total) by mouth 3 (three) times daily. 90 tablet 1  . glipiZIDE (GLUCOTROL) 10 MG tablet Take 1 tablet (10 mg total) by mouth 2 (two) times daily before a meal. 60 tablet 1  . lisinopril (ZESTRIL) 2.5 MG tablet Take 2.5 mg by mouth daily.    . metoprolol tartrate (LOPRESSOR) 25 MG tablet Take 1 tablet (25 mg total) by mouth 2 (two) times daily. 180 tablet 3  . sitaGLIPtin-metformin (JANUMET) 50-1000 MG tablet Take 1 tablet by mouth 2 (two) times daily with a meal. 60 tablet 0  . temazepam (RESTORIL) 15 MG capsule Take 1 capsule (15 mg total) by mouth at bedtime as needed for sleep. (Patient not taking: Reported on 09/07/2019) 30 capsule 0   No current facility-administered medications for this visit.    No Known Allergies  Social History   Socioeconomic History  . Marital status: Unknown    Spouse name: Not on file  . Number of children: Not on file  . Years of education: Not on file  . Highest education level: Not on file  Occupational History  . Not on file  Tobacco Use  . Smoking status: Current Every Day Smoker    Types: Cigarettes  . Smokeless tobacco: Never Used  Vaping Use  . Vaping Use: Never used  Substance and Sexual Activity  . Alcohol use: Not Currently  . Drug use: Not Currently  . Sexual activity: Not on file  Other Topics Concern  . Not on file  Social History Narrative  . Not on file   Social Determinants of Health   Financial Resource Strain:   . Difficulty of Paying Living Expenses: Not on file  Food Insecurity:   . Worried About Programme researcher, broadcasting/film/video in the Last Year: Not on file  . Ran Out of Food in the Last Year: Not on file  Transportation Needs:   . Lack of Transportation (Medical): Not on file  . Lack of Transportation (Non-Medical): Not on file  Physical Activity:   . Days of Exercise per  Week: Not on file  . Minutes of Exercise per Session: Not on file  Stress:   . Feeling of Stress : Not on file  Social Connections:   . Frequency of Communication with Friends and Family: Not on file  . Frequency of Social Gatherings with Friends and Family: Not on file  . Attends Religious Services: Not on file  . Active Member of Clubs or Organizations: Not on file  . Attends Banker Meetings: Not on file  . Marital Status: Not on file  Intimate Partner Violence:   . Fear of Current or Ex-Partner: Not on file  . Emotionally Abused: Not on file  . Physically Abused: Not on file  . Sexually  Abused: Not on file    Family History  Problem Relation Age of Onset  . Heart attack Father     Review of Systems:  As stated in the HPI and otherwise negative.   No vitals taken, Pt does not have BP cuff at home  Physical Examination: Well appearing NAD  EKG:  EKG is not  ordered today. The ekg ordered today demonstrates   Echo 05/23/18: 1. Left ventricular ejection fraction, by visual estimation, is 60 to 65%. The left ventricle has normal function. There is no left ventricular hypertrophy. 2. Left ventricular diastolic parameters are indeterminate. 3. Global right ventricle has normal systolic function.The right ventricular size is normal. No increase in right ventricular wall thickness. 4. Left atrial size was normal. 5. Right atrial size was normal. 6. The mitral valve is normal in structure. No evidence of mitral valve regurgitation. No evidence of mitral stenosis. 7. The tricuspid valve is normal in structure. Tricuspid valve regurgitation is not demonstrated. 8. The aortic valve is tricuspid. Aortic valve regurgitation is not visualized. Mild aortic valve sclerosis without stenosis. 9. The pulmonic valve was normal in structure. Pulmonic valve regurgitation is not visualized. 10. TR signal is inadequate for assessing pulmonary artery systolic pressure. 11.  The inferior vena cava is normal in size with <50% respiratory variability, suggesting right atrial pressure of 8 mmHg.  Cardiac cath 03/23/19:  Prox RCA lesion is 99% stenosed.  Mid RCA lesion is 95% stenosed.  Dist RCA lesion is 60% stenosed.  1st RPL lesion is 100% stenosed.  1st Mrg lesion is 99% stenosed.  Mid Cx lesion is 70% stenosed.  Mid LAD lesion is 90% stenosed.  1st Diag lesion is 90% stenosed.  There is mild left ventricular systolic dysfunction.  LV end diastolic pressure is normal.  There is no mitral valve regurgitation.  The left ventricular ejection fraction is 35-45% by visual estimate.  1. Acute inferior STEMI secondary to severe disease in the mid and distal RCA and acute occlusion of a small posterolateral artery.  2. Severe stenosis mid LAD 3. Severe stenosis moderate caliber diagonal branch 4. Severe stenosis first obtuse marginal branch 5. Severe stenosis mid Circumflex 6. Mild to moderate LV systolic dysfunction with inferior wall motion abnormality.   Recent Labs: 03/23/2019: TSH 1.094 03/26/2019: Magnesium 1.8 03/31/2019: ALT 40 04/07/2019: BUN 16; Creatinine, Ser 0.61; Potassium 4.1; Sodium 134 04/08/2019: Hemoglobin 10.2; Platelets 172   Lipid Panel    Component Value Date/Time   CHOL 112 03/28/2019 0408   TRIG 114 03/28/2019 0408   HDL 21 (L) 03/28/2019 0408   CHOLHDL 5.3 03/28/2019 0408   VLDL 23 03/28/2019 0408   LDLCALC 68 03/28/2019 0408     Wt Readings from Last 3 Encounters:  02/03/20 190 lb (86.2 kg)  09/07/19 179 lb 12.8 oz (81.6 kg)  07/18/19 180 lb (81.6 kg)     Other studies Reviewed: Additional studies/ records that were reviewed today include: cath images, echo images, hospital notes   Assessment and Plan:   1. CAD s/p CABG without angina: She has no chest pain. Will continue DAPT with ASA and Plavix for one year post MI. Continue statin and beta blocker She had blood work done recently. She was told that her  cholesterol was ok.   2. DM: Managed in primary care.   3. Tobacco abuse: Smoking cessation counseling  Current medicines are reviewed at length with the patient today.  The patient does not have concerns regarding medicines.  The  following changes have been made:  no change  Labs/ tests ordered today include:  No orders of the defined types were placed in this encounter.  Disposition:   FU with me in 6 months  Signed, Verne Carrow, MD 02/03/2020 5:02 PM    [facility] [address] [place], [place], [place]  [address] Phone: [phone]; Fax: [phone]

## 2020-02-29 ENCOUNTER — Ambulatory Visit (HOSPITAL_COMMUNITY): Payer: Medicaid Other | Attending: Adult Health | Admitting: Occupational Therapy

## 2020-02-29 ENCOUNTER — Other Ambulatory Visit: Payer: Self-pay

## 2020-02-29 ENCOUNTER — Encounter (HOSPITAL_COMMUNITY): Payer: Self-pay | Admitting: Occupational Therapy

## 2020-02-29 ENCOUNTER — Encounter (HOSPITAL_COMMUNITY): Payer: Medicaid Other

## 2020-02-29 DIAGNOSIS — R29898 Other symptoms and signs involving the musculoskeletal system: Secondary | ICD-10-CM | POA: Insufficient documentation

## 2020-02-29 DIAGNOSIS — R278 Other lack of coordination: Secondary | ICD-10-CM | POA: Diagnosis present

## 2020-02-29 NOTE — Therapy (Signed)
[place], [place], [address] Phone: [phone]   Fax:  [phone]  Occupational Therapy Reassessment, Treatment, Discharge Summary  Patient Details  Name: Joan Webb MRN: [id number] Date of Birth: July 27, 1970 Referring Provider (OT): Frann Rider, NP   Encounter Date: 02/29/2020   OT End of Session - 02/29/20 1003    Visit Number 7    Number of Visits 16    Date for OT Re-Evaluation 02/04/20    Authorization Type [employer] [place]    Authorization Time Period Not in [facility]-will request auth. Pt given information on switching to in-network plan, will be self-pay until authorized    OT Start Time 0930    OT Stop Time 1002    OT Time Calculation (min) 32 min    Activity Tolerance Patient tolerated treatment well    Behavior During Therapy [facility] for tasks assessed/performed           Past Medical History:  Diagnosis Date  . Anxiety   . Coronary artery disease   . Diabetes ([place])   . Hyperlipidemia   . Myocardial infarction ([place])   . Tobacco abuse     Past Surgical History:  Procedure Laterality Date  . CARDIAC CATHETERIZATION    . CORONARY ARTERY BYPASS GRAFT N/A 03/25/2019   Procedure: CORONARY ARTERY BYPASS GRAFTING (CABG) X THREE, ON PUMP, USING LEFT INTERNAL MAMMARY ARTERY AND LEFT GREATER SAPHENOUS VEIN HARVESTED ENDOSCOPICALLY;  Surgeon: Ivin Poot, MD;  Location: [facility];  Service: Open Heart Surgery;  Laterality: N/A;  . LEFT HEART CATH AND CORONARY ANGIOGRAPHY N/A 03/23/2019   Procedure: LEFT HEART CATH AND CORONARY ANGIOGRAPHY;  Surgeon: Burnell Blanks, MD;  Location: [place] CV LAB;  Service: Cardiovascular;  Laterality: N/A;  . None    . TEE WITHOUT CARDIOVERSION N/A 03/25/2019   Procedure: TRANSESOPHAGEAL ECHOCARDIOGRAM (TEE);  Surgeon: Prescott Gum, Collier Salina, MD;  Location: [facility];  Service: Open Heart Surgery;  Laterality: N/A;    There were no vitals filed for this  visit.   Subjective Assessment - 02/29/20 0936    Subjective  S: I can raise my arm up now.    Currently in Pain? No/denies              [facility] OT Assessment - 02/29/20 0935      Assessment   Medical Diagnosis RUE weakness s/p CVA      Precautions   Precautions Other (comment)    Precaution Comments s/p CABG on 03/25/2019      Coordination   9 Hole Peg Test Right    Right 9 Hole Peg Test 43.23"   '1\' 38"'$  previous     AROM   Overall AROM Comments Assessed seated, er/IR adducted. Elbow and wrist are WNL    AROM Assessment Site Shoulder    Right/Left Shoulder Right    Right Shoulder Flexion 84 Degrees   70 previous   Right Shoulder ABduction 62 Degrees   80 previous   Right Shoulder Internal Rotation 90 Degrees   same as previous   Right Shoulder External Rotation 26 Degrees   25 previous     Strength   Strength Assessment Site Shoulder;Elbow;Forearm;Wrist;Hand    Right/Left Shoulder Right    Right Shoulder Flexion 3-/5   same as previous   Right Shoulder ABduction 3-/5   same as previous   Right Shoulder Internal Rotation 4-/5   same as previous   Right Shoulder External Rotation 3/5  same as previous   Right/Left Elbow Right    Right Elbow Flexion 4/5   same as previous   Right Elbow Extension 4+/5   4/5 previous   Right/Left Forearm Right    Right Forearm Pronation 3/5   same as previous   Right Forearm Supination 3/5   same as previous   Right/Left Wrist Right    Right Wrist Flexion 3+/5   same as previous   Right Wrist Extension 3/5   same as previous   Right Wrist Radial Deviation 3/5   same as previous   Right Wrist Ulnar Deviation 3/5   same as previous   Right/Left hand Right    Right Hand Grip (lbs) 16   10 previous   Right Hand Lateral Pinch 4 lbs   3 previous   Right Hand 3 Point Pinch 5 lbs   same as previous                   OT Treatments/Exercises (OP) - 02/29/20 0955      ADLs   ADL Comments Pt provided with energy conservation  strategies and educated on how to incorporate during ADLs, I/ADLs, and community tasks.  Reviewed the six Ps and reviewed strategies for daily tasks in the packet. Pt requiring occasional redirection to task, was able to verbalize when she uses currently and additional ways to incorporate strategies into her daily tasks. Also encouraged pt to find stress management techniques.                     OT Short Term Goals - 02/29/20 0946      OT SHORT TERM GOAL #1   Title Pt will be provided with and educated on HEP to improve use of RUE during ADL completion.    Time 4    Period Weeks    Status Achieved    Target Date 01/05/20      OT SHORT TERM GOAL #2   Title Pt will increase RUE strength to 4-/5 to improve ability to use RUE during dressing and bathing tasks.    Time 4    Period Weeks    Status Partially Met      OT SHORT TERM GOAL #3   Title Pt will increase RUE A/ROM to [facility] to improve ability to reach items in overhead cabinets.    Time 4    Period Weeks    Status Not Met      OT SHORT TERM GOAL #4   Title Pt will increase right grip strength by 12# and pinch strength by 3# to improve ability to hold eating utensils and feed herself.    Time 4    Period Weeks    Status Not Met      OT SHORT TERM GOAL #5   Title Pt will increase right fine motor coordination by completing 9 hole peg test in under 45" to improve ability to perform grooming tasks such as brushing teeth or hair using RUE.    Time 4    Period Weeks    Status Achieved             OT Long Term Goals - 02/29/20 0947      OT LONG TERM GOAL #1   Title Pt will increase RUE strength to 4/5 or greater to improve ability to lift and support grandchild.    Time 8    Period Weeks    Status Partially Met  OT LONG TERM GOAL #2   Title Pt will increase right grip strength by 25# and pinch strength by 5# to improve ability to grasp and maintain hold on items.    Time 8    Period Weeks    Status Not Met       OT LONG TERM GOAL #3   Title Pt will increase RUE fine motor coordination by completing 9 hole peg test in 30" or less to improve ability to tie shoes.    Time 8    Period Weeks    Status Not Met      OT LONG TERM GOAL #4   Title Pt will be educated on pain management techniques for neuropathy in her hands to improve ability to sleep at night.    Time 8    Period Weeks    Status Not Met      OT LONG TERM GOAL #5   Title Pt will decrease fascial restrictions in RUE to improve mobility required for functional reaching tasks.    Time 8    Period Weeks    Status Not Met                 Plan - 02/29/20 1003    Clinical Impression Statement A: Pt arrived 30 minutes late for her appointment today. OT was able to see her due to another cancellation. Reassessment completed, pt has met 2 STGs and has partially met 1 LTG. Pt with multiple no-shows and significantly late arrivals, multiple excuses as to lack of attendance. Verbalized understanding and discussed discharging today as pt is unable to attend appointments regularly and is not making progress. Educated pt on energy conservation strategies, pt verbalized understanding. Answered questions regarding current HEP for RUE strengthening. Pt is agreeable to discharge today.    Body Structure / Function / Physical Skills ADL;Endurance;UE functional use;Fascial restriction;Pain;FMC;ROM;IADL;Strength    Plan P: Discharge pt    OT Home Exercise Plan eval: table slides 8/16: yellow theraputty, AA/ROM shoulder seated- flexion, protraction, abduction; 10/13: energy conservation strategies    Consulted and Agree with Plan of Care Patient           Patient will benefit from skilled therapeutic intervention in order to improve the following deficits and impairments:   Body Structure / Function / Physical Skills: ADL, Endurance, UE functional use, Fascial restriction, Pain, FMC, ROM, IADL, Strength       Visit Diagnosis: Other  symptoms and signs involving the musculoskeletal system  Other lack of coordination    Problem List Patient Active Problem List   Diagnosis Date Noted  . Neuropathic pain of right hand 09/07/2019  . Tobacco abuse 07/18/2019  . Stroke ([place]) 04/27/2019  . Abnormality of gait 04/21/2019  . Thrombocytopenia ([place])   . Acute deep vein thrombosis (DVT) of axillary vein of left upper extremity ([place])   . Swelling   . Sleep disturbance   . Uncontrolled type 2 diabetes mellitus with hyperglycemia ([place])   . Hypoalbuminemia due to protein-calorie malnutrition ([place])   . Hyponatremia   . Labile blood glucose   . Spasticity   . Constipation   . Subcortical infarction ([place]) 03/30/2019  . Acute cerebral infarction ([place])   . Acute blood loss anemia   . Class 1 obesity due to excess calories with serious comorbidity and body mass index (BMI) of 32.0 to 32.9 in adult   . Cognitive deficit, post-stroke   . Coronary artery disease involving native heart without angina pectoris   .  Diabetic peripheral neuropathy ([place])   . Essential hypertension   . 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 Acute ST elevation myocardial infarction (STEMI) of inferior wall ([place]) 03/23/2019  . ST elevation myocardial infarction (STEMI) of inferior wall [facility])    Guadelupe Sabin, OTR/L  [phone] 02/29/2020, 10:06 AM  [place], [place], [address] Phone: [phone]   Fax:  [phone]  Name: Joan Webb MRN: [id number] Date of Birth: 1970-09-14   OCCUPATIONAL THERAPY DISCHARGE SUMMARY  Visits from Start of Care: 7  Current functional level related to goals / functional outcomes: See above. Pt with lack of progress due to poor attendance. Pt is agreeable to discharge with HEP   Remaining deficits: RUE decreased strength, coordination, and functional use   Education / Equipment: HEP for RUE strengthening, coordination, energy conservation  strategies Plan: Patient agrees to discharge.  Patient goals were not met. Patient is being discharged due to lack of progress.  ?????

## 2020-09-10 DIAGNOSIS — Z0271 Encounter for disability determination: Secondary | ICD-10-CM

## 2020-11-28 NOTE — Telephone Encounter (Signed)
Called pt.  She is feeling "alright" but is needing a follow up.  She did not schedule with her pcp or see the reply to her recent message.  I have scheduled her with Dr. [name] on 12/14/20, 4:20 pm as she requested later in the day appointment.

## 2020-12-14 ENCOUNTER — Encounter: Payer: Self-pay | Admitting: Cardiovascular Disease

## 2020-12-14 ENCOUNTER — Other Ambulatory Visit: Payer: Self-pay

## 2020-12-14 ENCOUNTER — Ambulatory Visit (INDEPENDENT_AMBULATORY_CARE_PROVIDER_SITE_OTHER): Payer: Medicaid Other | Admitting: Cardiovascular Disease

## 2020-12-14 VITALS — BP 118/60 | HR 74 | Ht 62.0 in | Wt 197.0 lb

## 2020-12-14 DIAGNOSIS — I2510 Atherosclerotic heart disease of native coronary artery without angina pectoris: Secondary | ICD-10-CM

## 2020-12-14 NOTE — Progress Notes (Signed)
Chief Complaint  Patient presents with   Follow-up    CAD    History of Present Illness: 50 yo female with history of CAD, HLD, DM, postoperative CVA and tobacco abuse here today for cardiac follow up. She was admitted to [facility] 03/23/19 with an acute inferior ST elevation MI. Emergent cardiac cath 03/23/19 with multi-vessel CAD. She underwent 3V CABG (LIMA to LAD, SVG to Diagonal, SVG to PDA) on 03/25/19. Echo 03/24/19 with LVEF=60-65% without significant valve disease. She unfortunately suffered a CVA during the post-operative period and was discharged to inpatient rehab. 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 Plavix was stopped  and she was started on Eliquis. She was discharged home on 04/08/19. She was seen in follow up in the CT surgery office 04/27/19 by Dr. Donata Clay and was doing well. I saw her last in September 2021 by video visit and she was doing well. She is now off of Eliquis and has been on ASA and Plavix.   She is here today for follow up. The patient denies any chest pain, dyspnea, palpitations, lower extremity edema, orthopnea, PND, dizziness, near syncope or syncope.    Primary Care Physician: Toma Deiters, MD  Past Medical History:  Diagnosis Date   Anxiety    Coronary artery disease    Diabetes ([facility])    Hyperlipidemia    Myocardial infarction ([facility])    Tobacco abuse     Past Surgical History:  Procedure Laterality Date   CARDIAC CATHETERIZATION     CORONARY ARTERY BYPASS GRAFT N/A 03/25/2019   Procedure: CORONARY ARTERY BYPASS GRAFTING (CABG) X THREE, ON PUMP, USING LEFT INTERNAL MAMMARY ARTERY AND LEFT GREATER SAPHENOUS VEIN HARVESTED ENDOSCOPICALLY;  Surgeon: Kerin Perna, MD;  Location: [facility] OR;  Service: Open Heart Surgery;  Laterality: N/A;   LEFT HEART CATH AND CORONARY ANGIOGRAPHY N/A 03/23/2019   Procedure: LEFT HEART CATH AND CORONARY ANGIOGRAPHY;  Surgeon: Kathleene Hazel, MD;   Location: [facility] INVASIVE CV LAB;  Service: Cardiovascular;  Laterality: N/A;   None     TEE WITHOUT CARDIOVERSION N/A 03/25/2019   Procedure: TRANSESOPHAGEAL ECHOCARDIOGRAM (TEE);  Surgeon: Donata Clay, Theron Arista, MD;  Location: [facility] OR;  Service: Open Heart Surgery;  Laterality: N/A;    Current Outpatient Medications  Medication Sig Dispense Refill   acetaminophen (TYLENOL) 325 MG tablet Take 1-2 tablets (325-650 mg total) by mouth every 4 (four) hours as needed for mild pain.     amitriptyline (ELAVIL) 10 MG tablet Take 1 tablet (10 mg total) by mouth at bedtime. 30 tablet 1   aspirin EC 81 MG EC tablet Take 1 tablet (81 mg total) by mouth daily.     atorvastatin (LIPITOR) 80 MG tablet Take 1 tablet (80 mg total) by mouth daily at 6 PM. 30 tablet 1   buPROPion (WELLBUTRIN SR) 150 MG 12 hr tablet Take 150 mg by mouth 2 (two) times daily.     Cholecalciferol (QC VITAMIN D3) 125 MCG (5000 UT) TABS Take 1 tablet (5,000 Units total) by mouth daily. 30 tablet 1   citalopram (CELEXA) 40 MG tablet Take 40 mg by mouth daily.     clopidogrel (PLAVIX) 75 MG tablet Take 1 tablet (75 mg total) by mouth daily. 90 tablet 3   gabapentin (NEURONTIN) 600 MG tablet Take 1 tablet (600 mg total) by mouth 3 (three) times daily. 90 tablet 1   glipiZIDE (GLUCOTROL)  10 MG tablet Take 1 tablet (10 mg total) by mouth 2 (two) times daily before a meal. 60 tablet 1   JARDIANCE 10 MG TABS tablet Take 10 mg by mouth every morning.     lisinopril (ZESTRIL) 2.5 MG tablet Take 2.5 mg by mouth daily.     metoprolol tartrate (LOPRESSOR) 25 MG tablet Take 1 tablet (25 mg total) by mouth 2 (two) times daily. 180 tablet 3   sitaGLIPtin-metformin (JANUMET) 50-1000 MG tablet Take 1 tablet by mouth 2 (two) times daily with a meal. 60 tablet 0   temazepam (RESTORIL) 15 MG capsule Take 1 capsule (15 mg total) by mouth at bedtime as needed for sleep. (Patient not taking: Reported on 09/07/2019) 30 capsule 0   No current facility-administered  medications for this visit.    No Known Allergies  Social History   Socioeconomic History   Marital status: Unknown    Spouse name: Not on file   Number of children: Not on file   Years of education: Not on file   Highest education level: Not on file  Occupational History   Not on file  Tobacco Use   Smoking status: Every Day    Types: Cigarettes   Smokeless tobacco: Never  Vaping Use   Vaping Use: Never used  Substance and Sexual Activity   Alcohol use: Not Currently   Drug use: Not Currently   Sexual activity: Not on file  Other Topics Concern   Not on file  Social History Narrative   Not on file   Social Determinants of Health   Financial Resource Strain: Not on file  Food Insecurity: Not on file  Transportation Needs: Not on file  Physical Activity: Not on file  Stress: Not on file  Social Connections: Not on file  Intimate Partner Violence: Not on file    Family History  Problem Relation Age of Onset   Heart attack Father     Review of Systems:  As stated in the HPI and otherwise negative.   BP 118/60   Pulse 74   Ht 5\' 2"  (1.575 m)   Wt 197 lb (89.4 kg)   SpO2 98%   BMI 36.03 kg/m   Physical Examination: General: Well developed, well nourished, NAD  HEENT: OP clear, mucus membranes moist  SKIN: warm, dry. No rashes. Neuro: No focal deficits  Musculoskeletal: Muscle strength 5/5 all ext  Psychiatric: Mood and affect normal  Neck: No JVD, no carotid bruits, no thyromegaly, no lymphadenopathy.  Lungs:Clear bilaterally, no wheezes, rhonci, crackles Cardiovascular: Regular rate and rhythm. No murmurs, gallops or rubs. Abdomen:Soft. Bowel sounds present. Non-tender.  Extremities: No lower extremity edema. Pulses are 2 + in the bilateral DP/PT.  EKG:  EKG is ordered today. The ekg ordered today demonstrates sinus  Echo 05/23/18:  1. Left ventricular ejection fraction, by visual estimation, is 60 to 65%. The left ventricle has normal function.  There is no left ventricular hypertrophy.  2. Left ventricular diastolic parameters are indeterminate.  3. Global right ventricle has normal systolic function.The right ventricular size is normal. No increase in right ventricular wall thickness.  4. Left atrial size was normal.  5. Right atrial size was normal.  6. The mitral valve is normal in structure. No evidence of mitral valve regurgitation. No evidence of mitral stenosis.  7. The tricuspid valve is normal in structure. Tricuspid valve regurgitation is not demonstrated.  8. The aortic valve is tricuspid. Aortic valve regurgitation is not visualized. Mild aortic  valve sclerosis without stenosis.  9. The pulmonic valve was normal in structure. Pulmonic valve regurgitation is not visualized. 10. TR signal is inadequate for assessing pulmonary artery systolic pressure. 11. The inferior vena cava is normal in size with <50% respiratory variability, suggesting right atrial pressure of 8 mmHg.  Cardiac cath 03/23/19: Cath 11/5  Prox RCA lesion is 99% stenosed. Mid RCA lesion is 95% stenosed. Dist RCA lesion is 60% stenosed. 1st RPL lesion is 100% stenosed. 1st Mrg lesion is 99% stenosed. Mid Cx lesion is 70% stenosed. Mid LAD lesion is 90% stenosed. 1st Diag lesion is 90% stenosed. There is mild left ventricular systolic dysfunction. LV end diastolic pressure is normal. There is no mitral valve regurgitation. The left ventricular ejection fraction is 35-45% by visual estimate.   1. Acute inferior STEMI secondary to severe disease in the mid and distal RCA and acute occlusion of a small posterolateral artery.  2. Severe stenosis mid LAD 3. Severe stenosis moderate caliber diagonal branch 4. Severe stenosis first obtuse marginal branch 5. Severe stenosis mid Circumflex 6. Mild to moderate LV systolic dysfunction with inferior wall motion abnormality.   Recent Labs: No results found for requested labs within last 8760 hours.   Lipid  Panel    Component Value Date/Time   CHOL 112 03/28/2019 0408   TRIG 114 03/28/2019 0408   HDL 21 (L) 03/28/2019 0408   CHOLHDL 5.3 03/28/2019 0408   VLDL 23 03/28/2019 0408   LDLCALC 68 03/28/2019 0408     Wt Readings from Last 3 Encounters:  12/14/20 197 lb (89.4 kg)  02/03/20 190 lb (86.2 kg)  09/07/19 179 lb 12.8 oz (81.6 kg)     Other studies Reviewed: Additional studies/ records that were reviewed today include: cath images, echo images, hospital notes   Assessment and Plan:   1. CAD s/p CABG without angina: She has no chest pain. Will continue ASA, statin and beta blocker. She is not on Plavix since being started on Eliquis. LDL at goal in November 2021. BP controlled.   2. Tobacco abuse: Smoking cessation is advised.    Current medicines are reviewed at length with the patient today.  The patient does not have concerns regarding medicines.  The following changes have been made:  no change  Labs/ tests ordered today include:   Orders Placed This Encounter  Procedures   EKG 12-Lead      Disposition:   FU with me in 6 months   Signed, Verne Carrow, MD 12/14/2020 4:51 PM    [facility] [address] [place], [place], [place]  [address] Phone: [phone]; Fax: [phone]

## 2020-12-14 NOTE — Patient Instructions (Signed)
Medication Instructions:  Your physician recommends that you continue on your current medications as directed. Please refer to the Current Medication list given to you today.  *If you need a refill on your cardiac medications before your next appointment, please call your pharmacy*   Lab Work: NONE If you have labs (blood work) drawn today and your tests are completely normal, you will receive your results only by: MyChart Message (if you have MyChart) OR A paper copy in the mail If you have any lab test that is abnormal or we need to change your treatment, we will call you to review the results.   Testing/Procedures: NONE   Follow-Up: At [facility], you and your health needs are our priority.  As part of our continuing mission to provide you with exceptional heart care, we have created designated Provider Care Teams.  These Care Teams include your primary Cardiologist (physician) and Advanced Practice Providers (APPs -  Physician Assistants and Nurse Practitioners) who all work together to provide you with the care you need, when you need it.  W   Your next appointment:   1 year(s)  The format for your next appointment:   In Person  Provider:   You may see Verne Carrow, MD or one of the following Advanced Practice Providers on your designated Care Team:   Ronie Spies, PA-C Jacolyn Reedy, PA-C

## 2021-06-17 ENCOUNTER — Encounter: Payer: Medicaid Other | Admitting: Adult Health

## 2021-07-25 ENCOUNTER — Other Ambulatory Visit: Payer: Self-pay

## 2021-07-25 ENCOUNTER — Ambulatory Visit (INDEPENDENT_AMBULATORY_CARE_PROVIDER_SITE_OTHER): Payer: Medicaid Other | Admitting: Obstetrics & Gynecology

## 2021-07-25 ENCOUNTER — Encounter: Payer: Self-pay | Admitting: Obstetrics & Gynecology

## 2021-07-25 ENCOUNTER — Other Ambulatory Visit (HOSPITAL_COMMUNITY)
Admission: RE | Admit: 2021-07-25 | Discharge: 2021-07-25 | Disposition: A | Discharge status: Home / Self Care | Payer: Medicaid Other | Source: Ambulatory Visit | Attending: Obstetrics & Gynecology | Admitting: Obstetrics & Gynecology

## 2021-07-25 VITALS — BP 115/69 | HR 81 | Ht 62.0 in | Wt 190.6 lb

## 2021-07-25 DIAGNOSIS — B379 Candidiasis, unspecified: Secondary | ICD-10-CM | POA: Diagnosis not present

## 2021-07-25 DIAGNOSIS — Z1231 Encounter for screening mammogram for malignant neoplasm of breast: Secondary | ICD-10-CM | POA: Diagnosis not present

## 2021-07-25 DIAGNOSIS — Z01419 Encounter for gynecological examination (general) (routine) without abnormal findings: Secondary | ICD-10-CM

## 2021-07-25 DIAGNOSIS — N9089 Other specified noninflammatory disorders of vulva and perineum: Secondary | ICD-10-CM

## 2021-07-25 MED ORDER — FLUCONAZOLE 150 MG PO TABS: ORAL_TABLET | ORAL | 4 refills | Status: DC

## 2021-07-25 MED ORDER — CLOTRIMAZOLE-BETAMETHASONE 1-0.05 % EX CREA: 1.0000 "application " | TOPICAL_CREAM | Freq: Two times a day (BID) | CUTANEOUS | 0 refills | Status: AC

## 2021-07-25 NOTE — Patient Instructions (Signed)
Please schedule a mammogram at one of the following locations:  [place]: [phone]  [facility] [place]:[phone] [address]  

## 2021-07-25 NOTE — Progress Notes (Signed)
? ?WELL-WOMAN EXAMINATION ?Patient name: Joan Webb MRN [id number]  Date of birth: Oct 30, 1970 ?Chief Complaint:   ?Gynecologic Exam (Last pap about 3 years ago. ) ? ?History of Present Illness:   ?Joan Webb is a 51 y.o. PM  female being seen today for a routine well-woman exam and the following concerns: ? ?-Vulvar irritation: This has been an issue for at least a few mos.  Notes redness, itching and burning.  Seen by PCP and urgent care- prior treatment did not work- sounds like she was treated for both yeast and UTI.  Today she denies vaginal discharge, +odor.   ? ?She does report h/o frequent UTIs, which has been treated but symptoms return.  Denies urinary symptoms currently.  Denies back pain.  Denies fever or chills ? ?Same partner x 68mos.  Denies dyspareunia ? ?No LMP recorded. Patient is postmenopausal. ? ?Last pap 3 yrs ago.  ?Last mammogram: ordered today. ?Last colonoscopy: declined colonoscopy ? ?Depression screen [facility] 2/9 07/25/2021 05/26/2019  ?Decreased Interest 2 3  ?Down, Depressed, Hopeless 3 2  ?PHQ - 2 Score 5 5  ?Altered sleeping 3 3  ?Tired, decreased energy 3 3  ?Change in appetite 3 2  ?Feeling bad or failure about yourself  1 2  ?Trouble concentrating 3 3  ?Moving slowly or fidgety/restless 3 3  ?Suicidal thoughts 0 1  ?PHQ-9 Score 21 22  ?Difficult doing work/chores - Extremely dIfficult  ? ? ? ? ?Review of Systems:   ?Pertinent items are noted in HPI ?Denies any headaches, blurred vision, fatigue, shortness of breath, chest pain, abdominal pain, bowel movements, urination, or intercourse unless otherwise stated above. ? ?Pertinent History Reviewed:  ?Reviewed past medical,surgical, social and family history.  ?Reviewed problem list, medications and allergies. ?Physical Assessment:  ? ?Vitals:  ? 07/25/21 1552  ?BP: 115/69  ?Pulse: 81  ?Weight: 190 lb 9.6 oz (86.5 kg)  ?Height: 5\' 2"  (1.575 m)  ?Body mass index is 34.86 kg/m?. ?  ?     Physical Examination:  ? General appearance - well  appearing, and in no distress ? Mental status - alert, oriented to person, place, and time ? Psych:  She has a normal mood and affect ? Skin - warm and dry, normal color, no suspicious lesions noted ? Chest - effort normal, all lung fields clear to auscultation bilaterally ? Heart - normal rate and regular rhythm ? Neck:  midline trachea, no thyromegaly or nodules ? Breasts - breasts appear normal, no suspicious masses, no skin or nipple changes or  axillary nodes ? Abdomen - soft, nontender, nondistended, no masses or organomegaly ? Pelvic - VULVA: bilateral erythema noted inner portion of labia majora with hypopigmentation at clitoral hood with agglutiniation  VAGINA: normal appearing vagina with normal color, thick clumpy white discharge noted  CERVIX: normal appearing cervix without discharge or lesions, no CMT ? Thin prep pap is done with HR HPV cotesting ? UTERUS: uterus is felt to be normal size, shape, consistency and nontender  ? ADNEXA: No adnexal masses or tenderness noted. ? Extremities:  No swelling or varicosities noted ? ?Chaperone: Celene Squibb   ? ? ?Assessment & Plan:  ?1) Well-Woman Exam ?-pap collected, reviewed screening guidelines ?-mammogram scheduled for April ?-declined colonoscopy ? ?2) Vulvar irritation/Yeast infection ?-reviewed findings on exam- plan to treat with both topical and oral treatment ?-reviewed conservative management ?-f/u in 3 mos if needed or sooner if no improvement ? ? ?No orders of the defined types were placed  in this encounter. ? ? ?Meds: No orders of the defined types were placed in this encounter. ? ? ?Follow-up: Return in about 1 year (around 07/26/2022) for Annual. ? ? ?[name], DO ?Attending [place], Faculty Practice ?[facility] ? ? ?

## 2021-07-30 LAB — CYTOLOGY - PAP
Adequacy: ABSENT
Comment: NEGATIVE
Diagnosis: NEGATIVE
High risk HPV: NEGATIVE

## 2021-10-15 ENCOUNTER — Other Ambulatory Visit: Payer: Self-pay

## 2021-10-15 ENCOUNTER — Telehealth: Payer: Self-pay

## 2021-10-15 NOTE — Telephone Encounter (Signed)
PHARMACY CALLED AND WANTS A APPROVAL FROM DR. OZAN FOR CLOTRIMAZOLE BETAMETHASONE. IF APPROVED THE PRESCRIPTION IS TO BE SENT TO EXACT PHARMACY

## 2021-10-22 NOTE — Telephone Encounter (Signed)
Called pt, she reported no issues with medication  Myna Hidalgo, DO Attending Obstetrician & Gynecologist, [facility] for [employer], [facility]

## 2021-11-30 IMAGING — CR DG CHEST 2V
2 series · 2 of 2 positions shown · non-contrast
Comparison: PA and lateral chest 04/27/2019.

CLINICAL DATA: Patient status post CABG 03/25/2019.

EXAM:
CHEST - 2 VIEW

[w chest pa]
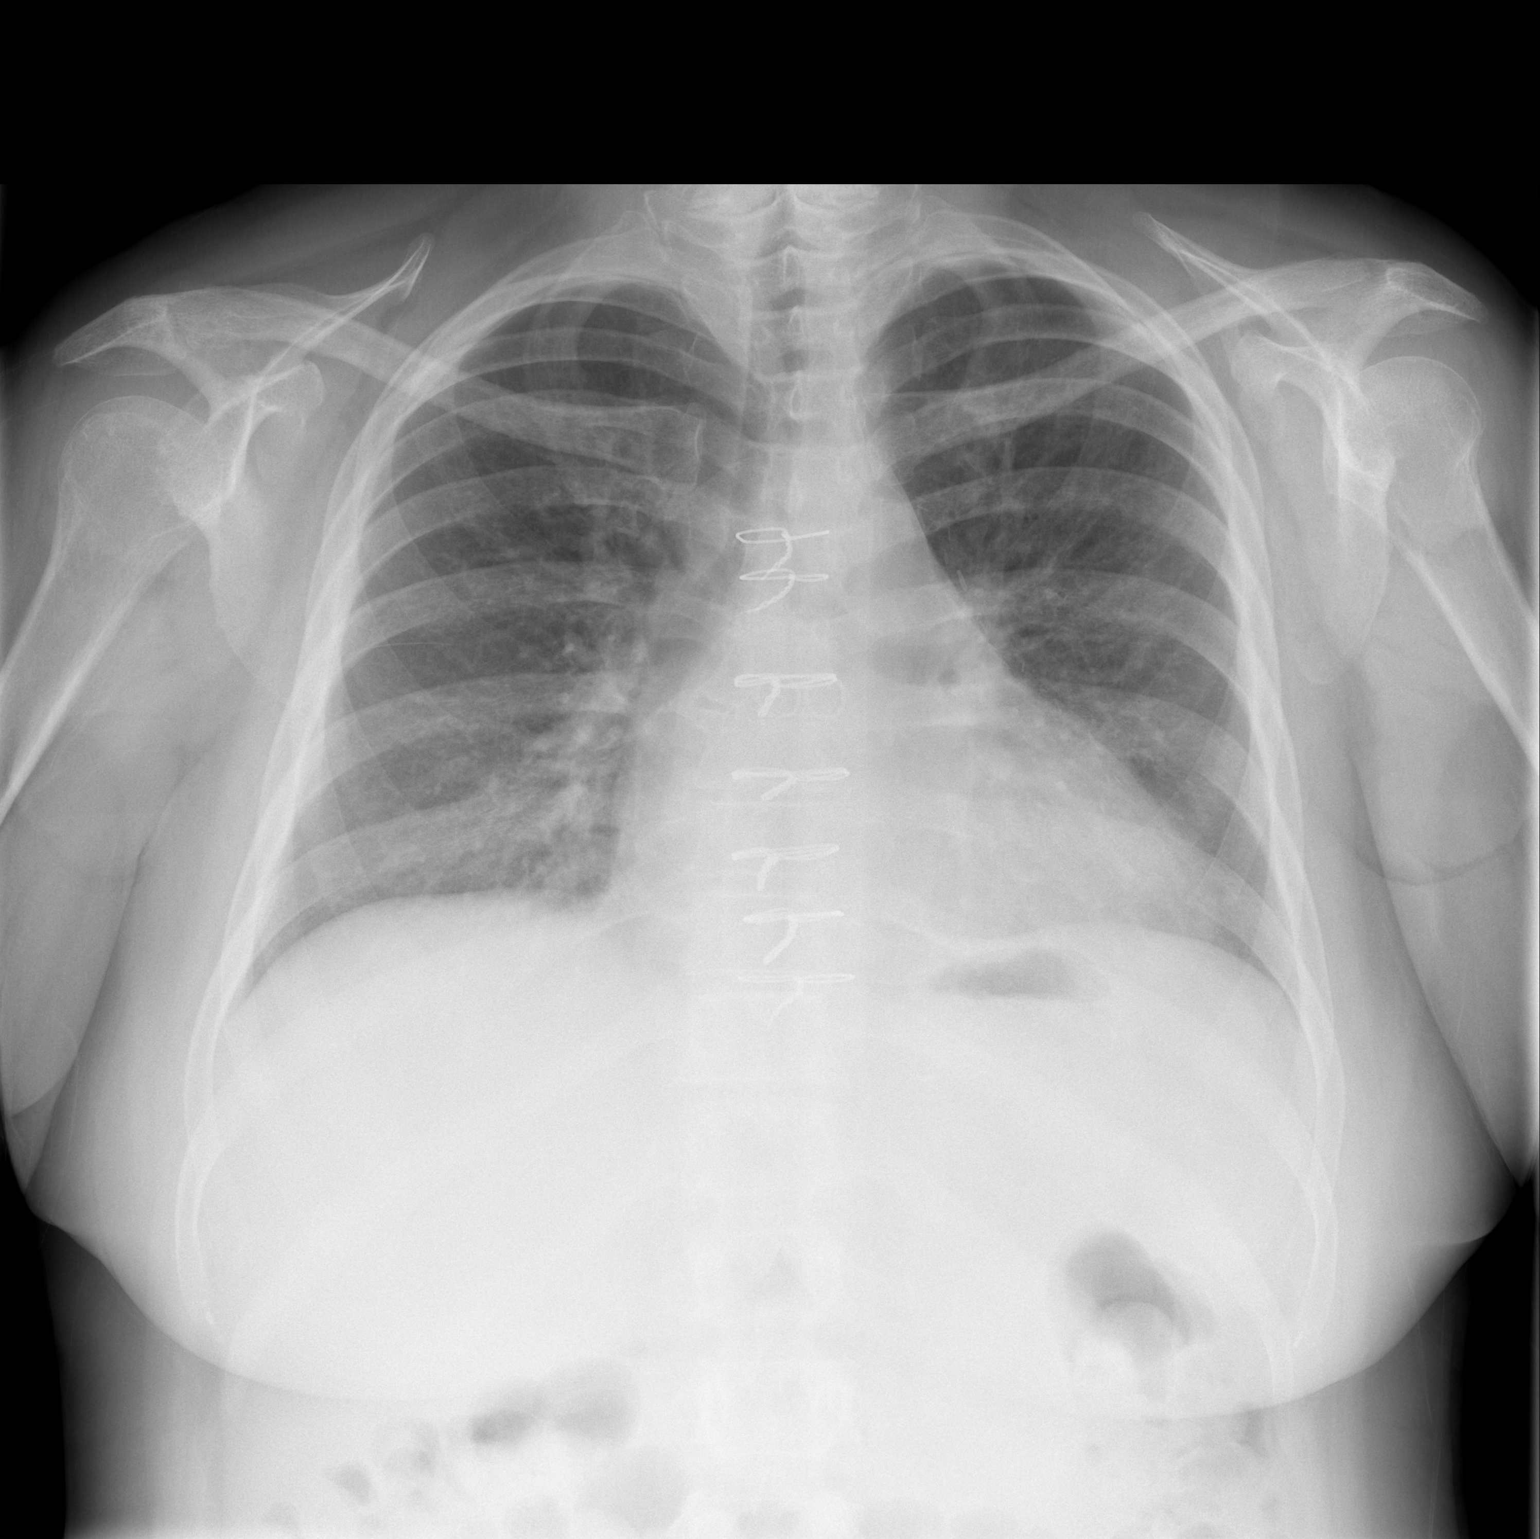

[w chest lat]
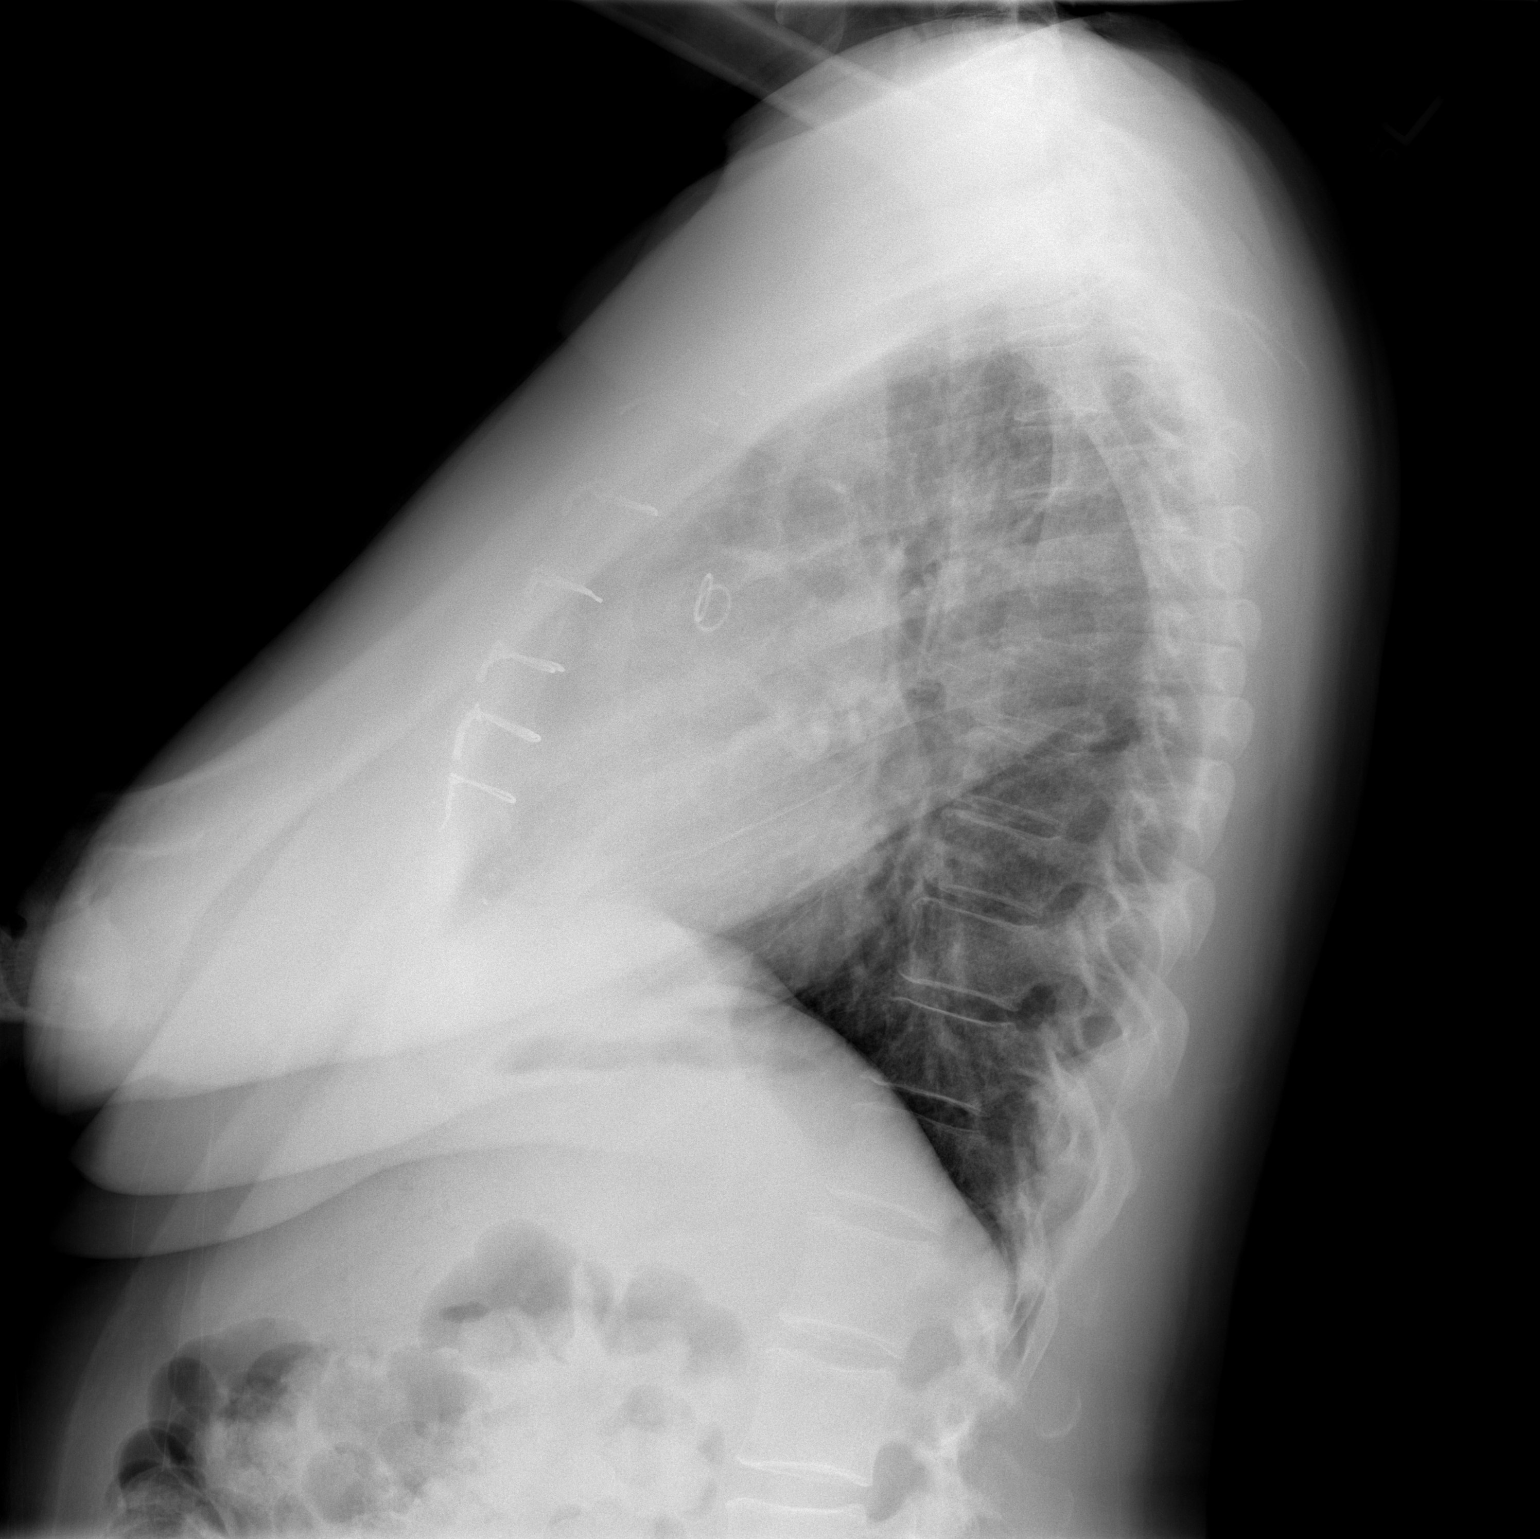

[2 of 2 positions shown; findings below may reference images not displayed]

FINDINGS: Median sternotomy wires are intact and unchanged. Lung volumes are
low but the lungs are clear. No pneumothorax or pleural fluid. Heart
size is upper normal. No acute or focal bony abnormality.
IMPRESSION: No acute disease.

## 2021-12-16 ENCOUNTER — Telehealth: Payer: Self-pay | Admitting: Obstetrics & Gynecology

## 2021-12-16 NOTE — Telephone Encounter (Signed)
Pt states she needs refills on fluconazole (DIFLUCAN) 150 MG tablet

## 2021-12-18 ENCOUNTER — Encounter: Payer: Self-pay | Admitting: Cardiovascular Disease

## 2021-12-18 ENCOUNTER — Ambulatory Visit (INDEPENDENT_AMBULATORY_CARE_PROVIDER_SITE_OTHER): Payer: Medicare Other | Admitting: Cardiovascular Disease

## 2021-12-18 VITALS — BP 108/60 | HR 80 | Ht 62.0 in | Wt 182.0 lb

## 2021-12-18 DIAGNOSIS — I2510 Atherosclerotic heart disease of native coronary artery without angina pectoris: Secondary | ICD-10-CM

## 2021-12-18 NOTE — Progress Notes (Signed)
Chief Complaint  Patient presents with   Follow-up    CAD   History of Present Illness: 51 yo female with history of CAD, HLD, DM, postoperative CVA and tobacco abuse here today for cardiac follow up. She was admitted to [facility] 03/23/19 with an acute inferior ST elevation MI. Emergent cardiac cath 03/23/19 with multi-vessel CAD. She underwent 3V CABG (LIMA to LAD, SVG to Diagonal, SVG to PDA) on 03/25/19. Echo 03/24/19 with LVEF=60-65% without significant valve disease. She unfortunately suffered a CVA during the post-operative period and was discharged to inpatient rehab. 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 Plavix was stopped and she was started on Eliquis. She was discharged home on 04/08/19. She is now off of Eliquis and has been back on ASA and Plavix.   She is here today for follow up. The patient denies any chest pain, dyspnea, palpitations, lower extremity edema, orthopnea, PND, dizziness, near syncope or syncope.    Primary Care Physician: Toma Deiters, MD  Past Medical History:  Diagnosis Date   Anxiety    Coronary artery disease    Diabetes (HCC)    Hyperlipidemia    Myocardial infarction (HCC)    Tobacco abuse     Past Surgical History:  Procedure Laterality Date   CARDIAC CATHETERIZATION     CORONARY ARTERY BYPASS GRAFT N/A 03/25/2019   Procedure: CORONARY ARTERY BYPASS GRAFTING (CABG) X THREE, ON PUMP, USING LEFT INTERNAL MAMMARY ARTERY AND LEFT GREATER SAPHENOUS VEIN HARVESTED ENDOSCOPICALLY;  Surgeon: Kerin Perna, MD;  Location: [facility] OR;  Service: Open Heart Surgery;  Laterality: N/A;   LEFT HEART CATH AND CORONARY ANGIOGRAPHY N/A 03/23/2019   Procedure: LEFT HEART CATH AND CORONARY ANGIOGRAPHY;  Surgeon: Kathleene Hazel, MD;  Location: [facility] INVASIVE CV LAB;  Service: Cardiovascular;  Laterality: N/A;   None     TEE WITHOUT CARDIOVERSION N/A 03/25/2019   Procedure: TRANSESOPHAGEAL ECHOCARDIOGRAM  (TEE);  Surgeon: Donata Clay, Theron Arista, MD;  Location: [facility] OR;  Service: Open Heart Surgery;  Laterality: N/A;    Current Outpatient Medications  Medication Sig Dispense Refill   acetaminophen (TYLENOL) 325 MG tablet Take 1-2 tablets (325-650 mg total) by mouth every 4 (four) hours as needed for mild pain.     amitriptyline (ELAVIL) 10 MG tablet Take 1 tablet (10 mg total) by mouth at bedtime. 30 tablet 1   aspirin EC 81 MG EC tablet Take 1 tablet (81 mg total) by mouth daily.     atorvastatin (LIPITOR) 80 MG tablet Take 1 tablet (80 mg total) by mouth daily at 6 PM. 30 tablet 1   buPROPion (WELLBUTRIN SR) 150 MG 12 hr tablet Take 150 mg by mouth 2 (two) times daily.     Cholecalciferol (QC VITAMIN D3) 125 MCG (5000 UT) TABS Take 1 tablet (5,000 Units total) by mouth daily. 30 tablet 1   citalopram (CELEXA) 40 MG tablet Take 40 mg by mouth daily.     clopidogrel (PLAVIX) 75 MG tablet Take 1 tablet (75 mg total) by mouth daily. 90 tablet 3   fluconazole (DIFLUCAN) 150 MG tablet Take once then repeat in 3 days if needed 2 tablet 4   gabapentin (NEURONTIN) 600 MG tablet Take 1 tablet (600 mg total) by mouth 3 (three) times daily. 90 tablet 1   glipiZIDE (GLUCOTROL) 10 MG tablet Take 1 tablet (10 mg total) by mouth 2 (two) times daily before a  meal. 60 tablet 1   JARDIANCE 10 MG TABS tablet Take 10 mg by mouth every morning.     lisinopril (ZESTRIL) 2.5 MG tablet Take 2.5 mg by mouth daily.     metoprolol tartrate (LOPRESSOR) 25 MG tablet Take 1 tablet (25 mg total) by mouth 2 (two) times daily. 180 tablet 3   sitaGLIPtin-metformin (JANUMET) 50-1000 MG tablet Take 1 tablet by mouth 2 (two) times daily with a meal. 60 tablet 0   No current facility-administered medications for this visit.    No Known Allergies  Social History   Socioeconomic History   Marital status: Unknown    Spouse name: Not on file   Number of children: Not on file   Years of education: Not on file   Highest education  level: Not on file  Occupational History   Not on file  Tobacco Use   Smoking status: Every Day    Types: Cigarettes   Smokeless tobacco: Never  Vaping Use   Vaping Use: Never used  Substance and Sexual Activity   Alcohol use: Not Currently   Drug use: Not Currently   Sexual activity: Yes    Birth control/protection: Post-menopausal  Other Topics Concern   Not on file  Social History Narrative   Not on file   Social Determinants of Health   Financial Resource Strain: Medium Risk (07/25/2021)   Overall Financial Resource Strain (CARDIA)    Difficulty of Paying Living Expenses: Somewhat hard  Food Insecurity: Food Insecurity Present (07/25/2021)   Hunger Vital Sign    Worried About Running Out of Food in the Last Year: Never true    Ran Out of Food in the Last Year: Sometimes true  Transportation Needs: No Transportation Needs (07/25/2021)   PRAPARE - Administrator, Civil Service (Medical): No    Lack of Transportation (Non-Medical): No  Physical Activity: Insufficiently Active (07/25/2021)   Exercise Vital Sign    Days of Exercise per Week: 3 days    Minutes of Exercise per Session: 20 min  Stress: Stress Concern Present (07/25/2021)   [employer] of Occupational Health - Occupational Stress Questionnaire    Feeling of Stress : Very much  Social Connections: Socially Isolated (07/25/2021)   Social Connection and Isolation Panel [NHANES]    Frequency of Communication with Friends and Family: More than three times a week    Frequency of Social Gatherings with Friends and Family: Three times a week    Attends Religious Services: Never    Active Member of Clubs or Organizations: No    Attends Banker Meetings: Never    Marital Status: Divorced  Catering manager Violence: Not At Risk (07/25/2021)   Humiliation, Afraid, Rape, and Kick questionnaire    Fear of Current or Ex-Partner: No    Emotionally Abused: No    Physically Abused: No    Sexually Abused:  No    Family History  Problem Relation Age of Onset   Heart attack Father     Review of Systems:  As stated in the HPI and otherwise negative.   BP 108/60   Pulse 80   Ht 5\' 2"  (1.575 m)   Wt 182 lb (82.6 kg)   SpO2 96%   BMI 33.29 kg/m   Physical Examination: General: Well developed, well nourished, NAD  HEENT: OP clear, mucus membranes moist  SKIN: warm, dry. No rashes. Neuro: No focal deficits  Musculoskeletal: Muscle strength 5/5 all ext  Psychiatric: Mood and  affect normal  Neck: No JVD, no carotid bruits, no thyromegaly, no lymphadenopathy.  Lungs:Clear bilaterally, no wheezes, rhonci, crackles Cardiovascular: Regular rate and rhythm. No murmurs, gallops or rubs. Abdomen:Soft. Bowel sounds present. Non-tender.  Extremities: No lower extremity edema. Pulses are 2 + in the bilateral DP/PT.  EKG:  EKG is ordered today. The ekg ordered today demonstrates sinus, poor R wave progression unchanged  Echo 05/23/18:  1. Left ventricular ejection fraction, by visual estimation, is 60 to 65%. The left ventricle has normal function. There is no left ventricular hypertrophy.  2. Left ventricular diastolic parameters are indeterminate.  3. Global right ventricle has normal systolic function.The right ventricular size is normal. No increase in right ventricular wall thickness.  4. Left atrial size was normal.  5. Right atrial size was normal.  6. The mitral valve is normal in structure. No evidence of mitral valve regurgitation. No evidence of mitral stenosis.  7. The tricuspid valve is normal in structure. Tricuspid valve regurgitation is not demonstrated.  8. The aortic valve is tricuspid. Aortic valve regurgitation is not visualized. Mild aortic valve sclerosis without stenosis.  9. The pulmonic valve was normal in structure. Pulmonic valve regurgitation is not visualized. 10. TR signal is inadequate for assessing pulmonary artery systolic pressure. 11. The inferior vena cava is  normal in size with <50% respiratory variability, suggesting right atrial pressure of 8 mmHg.  Cardiac cath 03/23/19: Prox RCA lesion is 99% stenosed. Mid RCA lesion is 95% stenosed. Dist RCA lesion is 60% stenosed. 1st RPL lesion is 100% stenosed. 1st Mrg lesion is 99% stenosed. Mid Cx lesion is 70% stenosed. Mid LAD lesion is 90% stenosed. 1st Diag lesion is 90% stenosed. There is mild left ventricular systolic dysfunction. LV end diastolic pressure is normal. There is no mitral valve regurgitation. The left ventricular ejection fraction is 35-45% by visual estimate.   1. Acute inferior STEMI secondary to severe disease in the mid and distal RCA and acute occlusion of a small posterolateral artery.  2. Severe stenosis mid LAD 3. Severe stenosis moderate caliber diagonal branch 4. Severe stenosis first obtuse marginal branch 5. Severe stenosis mid Circumflex 6. Mild to moderate LV systolic dysfunction with inferior wall motion abnormality.   Recent Labs: No results found for requested labs within last 365 days.   Lipid Panel    Component Value Date/Time   CHOL 112 03/28/2019 0408   TRIG 114 03/28/2019 0408   HDL 21 (L) 03/28/2019 0408   CHOLHDL 5.3 03/28/2019 0408   VLDL 23 03/28/2019 0408   LDLCALC 68 03/28/2019 0408     Wt Readings from Last 3 Encounters:  12/18/21 182 lb (82.6 kg)  07/25/21 190 lb 9.6 oz (86.5 kg)  12/14/20 197 lb (89.4 kg)    Assessment and Plan:   1. CAD s/p CABG without angina: No chest pain. LVEF normal in 2020. Continue DAPT with ASA and Plavix given her CAD and stroke. Continue statin and beta blocker. LDL 110 in April 2023. She was not taking Lipitor every day when this was done. She has been back on it and has repeat labs for later this week in primary care. BP is well controlled.    2. Tobacco abuse: I have asked her to stop smoking She will try Chantix.   Current medicines are reviewed at length with the patient today.  The patient does  not have concerns regarding medicines.  The following changes have been made:  no change  Labs/ tests ordered today  include:   Orders Placed This Encounter  Procedures   EKG 12-Lead   Disposition:   F/U with me in 12  months   Signed, Verne Carrow, MD 12/18/2021 3:02 PM    [facility] [address] [place], [place], [place]  [address] Phone: [phone]; Fax: [phone]

## 2021-12-18 NOTE — Patient Instructions (Signed)
Medication Instructions:  No changes *If you need a refill on your cardiac medications before your next appointment, please call your pharmacy*   Lab Work: none If you have labs (blood work) drawn today and your tests are completely normal, you will receive your results only by: MyChart Message (if you have MyChart) OR A paper copy in the mail If you have any lab test that is abnormal or we need to change your treatment, we will call you to review the results.   Testing/Procedures: none   Follow-Up: At [facility], you and your health needs are our priority.  As part of our continuing mission to provide you with exceptional heart care, we have created designated Provider Care Teams.  These Care Teams include your primary Cardiologist (physician) and Advanced Practice Providers (APPs -  Physician Assistants and Nurse Practitioners) who all work together to provide you with the care you need, when you need it.   Your next appointment:   12 month(s)  The format for your next appointment:   In Person  Provider:   Christopher McAlhany, MD     Important Information About Sugar       

## 2021-12-23 ENCOUNTER — Ambulatory Visit: Payer: Medicare Other | Admitting: Advanced Practice Midwife

## 2021-12-23 ENCOUNTER — Other Ambulatory Visit: Payer: Medicare Other

## 2022-03-15 DIAGNOSIS — R300 Dysuria: Secondary | ICD-10-CM | POA: Diagnosis not present

## 2022-03-19 DIAGNOSIS — I10 Essential (primary) hypertension: Secondary | ICD-10-CM | POA: Diagnosis not present

## 2022-03-19 DIAGNOSIS — E1143 Type 2 diabetes mellitus with diabetic autonomic (poly)neuropathy: Secondary | ICD-10-CM | POA: Diagnosis not present

## 2022-03-19 DIAGNOSIS — M5418 Radiculopathy, sacral and sacrococcygeal region: Secondary | ICD-10-CM | POA: Diagnosis not present

## 2022-03-19 DIAGNOSIS — E785 Hyperlipidemia, unspecified: Secondary | ICD-10-CM | POA: Diagnosis not present

## 2022-03-19 DIAGNOSIS — G4730 Sleep apnea, unspecified: Secondary | ICD-10-CM | POA: Diagnosis not present

## 2022-03-19 DIAGNOSIS — Z8673 Personal history of transient ischemic attack (TIA), and cerebral infarction without residual deficits: Secondary | ICD-10-CM | POA: Diagnosis not present

## 2022-06-01 DIAGNOSIS — J3489 Other specified disorders of nose and nasal sinuses: Secondary | ICD-10-CM | POA: Diagnosis not present

## 2022-06-01 DIAGNOSIS — R062 Wheezing: Secondary | ICD-10-CM | POA: Diagnosis not present

## 2022-06-10 DIAGNOSIS — J069 Acute upper respiratory infection, unspecified: Secondary | ICD-10-CM | POA: Diagnosis not present

## 2022-06-24 DIAGNOSIS — I10 Essential (primary) hypertension: Secondary | ICD-10-CM | POA: Diagnosis not present

## 2022-06-24 DIAGNOSIS — G4730 Sleep apnea, unspecified: Secondary | ICD-10-CM | POA: Diagnosis not present

## 2022-06-24 DIAGNOSIS — E785 Hyperlipidemia, unspecified: Secondary | ICD-10-CM | POA: Diagnosis not present

## 2022-06-24 DIAGNOSIS — E1143 Type 2 diabetes mellitus with diabetic autonomic (poly)neuropathy: Secondary | ICD-10-CM | POA: Diagnosis not present

## 2022-06-24 DIAGNOSIS — M5418 Radiculopathy, sacral and sacrococcygeal region: Secondary | ICD-10-CM | POA: Diagnosis not present

## 2022-06-24 DIAGNOSIS — Z8673 Personal history of transient ischemic attack (TIA), and cerebral infarction without residual deficits: Secondary | ICD-10-CM | POA: Diagnosis not present

## 2022-06-27 DIAGNOSIS — M5418 Radiculopathy, sacral and sacrococcygeal region: Secondary | ICD-10-CM | POA: Diagnosis not present

## 2022-06-27 DIAGNOSIS — E1143 Type 2 diabetes mellitus with diabetic autonomic (poly)neuropathy: Secondary | ICD-10-CM | POA: Diagnosis not present

## 2022-06-27 DIAGNOSIS — G4730 Sleep apnea, unspecified: Secondary | ICD-10-CM | POA: Diagnosis not present

## 2022-07-11 DIAGNOSIS — Z7984 Long term (current) use of oral hypoglycemic drugs: Secondary | ICD-10-CM | POA: Diagnosis not present

## 2022-07-11 DIAGNOSIS — E119 Type 2 diabetes mellitus without complications: Secondary | ICD-10-CM | POA: Diagnosis not present

## 2022-07-11 DIAGNOSIS — Z794 Long term (current) use of insulin: Secondary | ICD-10-CM | POA: Diagnosis not present

## 2022-07-11 DIAGNOSIS — H2513 Age-related nuclear cataract, bilateral: Secondary | ICD-10-CM | POA: Diagnosis not present

## 2022-09-30 DIAGNOSIS — Z8673 Personal history of transient ischemic attack (TIA), and cerebral infarction without residual deficits: Secondary | ICD-10-CM | POA: Diagnosis not present

## 2022-09-30 DIAGNOSIS — G4730 Sleep apnea, unspecified: Secondary | ICD-10-CM | POA: Diagnosis not present

## 2022-09-30 DIAGNOSIS — E1143 Type 2 diabetes mellitus with diabetic autonomic (poly)neuropathy: Secondary | ICD-10-CM | POA: Diagnosis not present

## 2022-09-30 DIAGNOSIS — E785 Hyperlipidemia, unspecified: Secondary | ICD-10-CM | POA: Diagnosis not present

## 2022-09-30 DIAGNOSIS — I10 Essential (primary) hypertension: Secondary | ICD-10-CM | POA: Diagnosis not present

## 2022-09-30 DIAGNOSIS — M5418 Radiculopathy, sacral and sacrococcygeal region: Secondary | ICD-10-CM | POA: Diagnosis not present

## 2022-09-30 DIAGNOSIS — I2510 Atherosclerotic heart disease of native coronary artery without angina pectoris: Secondary | ICD-10-CM | POA: Diagnosis not present

## 2022-10-09 ENCOUNTER — Ambulatory Visit (INDEPENDENT_AMBULATORY_CARE_PROVIDER_SITE_OTHER): Payer: 59 | Admitting: *Deleted

## 2022-10-09 ENCOUNTER — Ambulatory Visit: Payer: 59

## 2022-10-09 ENCOUNTER — Other Ambulatory Visit: Payer: Self-pay | Admitting: Adult Health

## 2022-10-09 ENCOUNTER — Other Ambulatory Visit (HOSPITAL_COMMUNITY)
Admission: RE | Admit: 2022-10-09 | Discharge: 2022-10-09 | Disposition: A | Discharge status: Home / Self Care | Payer: 59 | Source: Ambulatory Visit | Attending: Obstetrics & Gynecology | Admitting: Obstetrics & Gynecology

## 2022-10-09 VITALS — Ht 62.0 in | Wt 170.0 lb

## 2022-10-09 DIAGNOSIS — R300 Dysuria: Secondary | ICD-10-CM

## 2022-10-09 DIAGNOSIS — N898 Other specified noninflammatory disorders of vagina: Secondary | ICD-10-CM

## 2022-10-09 LAB — POCT URINALYSIS DIPSTICK
Bilirubin, UA: NEGATIVE
Blood, UA: NEGATIVE
Glucose, UA: POSITIVE — AB
Ketones, UA: NEGATIVE
Leukocytes, UA: NEGATIVE
Nitrite, UA: POSITIVE
Protein, UA: NEGATIVE
Spec Grav, UA: 1.015 (ref 1.010–1.025)
Urobilinogen, UA: 0.2 E.U./dL
pH, UA: 6 (ref 5.0–8.0)

## 2022-10-09 MED ORDER — FLUCONAZOLE 150 MG PO TABS: ORAL_TABLET | ORAL | 1 refills | Status: DC

## 2022-10-09 MED ORDER — NITROFURANTOIN MONOHYD MACRO 100 MG PO CAPS: 100.0000 mg | ORAL_CAPSULE | Freq: Two times a day (BID) | ORAL | 0 refills | Status: AC

## 2022-10-09 NOTE — Progress Notes (Signed)
   NURSE VISIT- UTI SYMPTOMS   SUBJECTIVE:  Joan Webb is a 52 y.o. [id number] female here for UTI symptoms. She is a GYN patient. She reports dysuria and vaginal itching . Reports A1C is down to 7 but still working on getting her blood sugars down.  OBJECTIVE:  Ht 5\' 2"  (1.575 m)   Wt 170 lb (77.1 kg)   BMI 31.09 kg/m   Appears well, in no apparent distress  Results for orders placed or performed in visit on 10/09/22 (from the past 24 hour(s))  POCT Urinalysis Dipstick   Collection Time: 10/09/22  3:22 PM  Result Value Ref Range   Color, UA yellow    Clarity, UA clear    Glucose, UA Positive (A) Negative   Bilirubin, UA negative    Ketones, UA negative    Spec Grav, UA 1.015 1.010 - 1.025   Blood, UA negative    pH, UA 6.0 5.0 - 8.0   Protein, UA Negative Negative   Urobilinogen, UA 0.2 0.2 or 1.0 E.U./dL   Nitrite, UA positive    Leukocytes, UA Negative Negative   Appearance     Odor      ASSESSMENT: GYN patient with UTI symptoms and positive nitrites  PLAN: Discussed with Cyril Mourning, AGNP   Rx sent by provider today: Yes, also requesting refill on Diflucan with known history of yeast infections.  Urine culture sent CV swab sent for BV, yeast, GC, CHL Call or return to clinic prn if these symptoms worsen or fail to improve as anticipated. Follow-up: as needed    Jobe Marker  10/09/2022 3:35 PM

## 2022-10-09 NOTE — Progress Notes (Signed)
Will rx macrobid and diflucan  

## 2022-10-10 LAB — MICROSCOPIC EXAMINATION
Casts: NONE SEEN /lpf
RBC, Urine: NONE SEEN /hpf (ref 0–2)

## 2022-10-10 LAB — URINALYSIS, ROUTINE W REFLEX MICROSCOPIC
Bilirubin, UA: NEGATIVE
Ketones, UA: NEGATIVE
Leukocytes,UA: NEGATIVE
Nitrite, UA: POSITIVE — AB
Protein,UA: NEGATIVE
RBC, UA: NEGATIVE
Specific Gravity, UA: >=1.03 — AB (ref 1.005–1.030)
Urobilinogen, Ur: 0.2 mg/dL (ref 0.2–1.0)
pH, UA: 6 (ref 5.0–7.5)

## 2022-10-11 LAB — URINE CULTURE

## 2022-10-13 LAB — URINE CULTURE

## 2022-10-14 ENCOUNTER — Other Ambulatory Visit: Payer: Self-pay | Admitting: Adult Health

## 2022-10-14 LAB — CERVICOVAGINAL ANCILLARY ONLY
Bacterial Vaginitis (gardnerella): POSITIVE — AB
Candida Glabrata: NEGATIVE
Candida Vaginitis: POSITIVE — AB
Chlamydia: NEGATIVE
Comment: NEGATIVE
Comment: NEGATIVE
Comment: NEGATIVE
Comment: NEGATIVE
Comment: NEGATIVE
Comment: NORMAL
Neisseria Gonorrhea: NEGATIVE
Trichomonas: NEGATIVE

## 2022-10-14 MED ORDER — METRONIDAZOLE 500 MG PO TABS: 500.0000 mg | ORAL_TABLET | Freq: Two times a day (BID) | ORAL | 0 refills | Status: DC

## 2022-10-14 NOTE — Progress Notes (Signed)
+  BV and yeast on vaginal swab, has diflucan will rx flagyl and urine +  Ecoli has macrobid

## 2022-10-15 ENCOUNTER — Telehealth: Payer: Self-pay

## 2022-10-15 NOTE — Telephone Encounter (Signed)
Spoke with patient informed of results.

## 2022-10-15 NOTE — Telephone Encounter (Signed)
Patient would like for someone to call her concerning her test result.

## 2022-10-21 DIAGNOSIS — I209 Angina pectoris, unspecified: Secondary | ICD-10-CM | POA: Diagnosis not present

## 2022-10-21 DIAGNOSIS — R079 Chest pain, unspecified: Secondary | ICD-10-CM | POA: Diagnosis not present

## 2022-10-21 DIAGNOSIS — R109 Unspecified abdominal pain: Secondary | ICD-10-CM | POA: Diagnosis not present

## 2022-10-27 DIAGNOSIS — I209 Angina pectoris, unspecified: Secondary | ICD-10-CM | POA: Diagnosis not present

## 2022-11-21 DIAGNOSIS — J069 Acute upper respiratory infection, unspecified: Secondary | ICD-10-CM | POA: Diagnosis not present

## 2022-12-01 ENCOUNTER — Ambulatory Visit: Payer: 59 | Admitting: Obstetrics & Gynecology

## 2022-12-09 ENCOUNTER — Telehealth: Payer: Self-pay | Admitting: *Deleted

## 2022-12-09 NOTE — Progress Notes (Signed)
  Care Coordination   Note   12/09/2022 Name: Joan Webb MRN: [id number] DOB: 02/21/1971  Joan Webb is a 52 y.o. year old female who sees Hasanaj, Myra Gianotti, MD for primary care. I reached out to [name] by phone today to offer care coordination services.  Joan Webb was given information about Care Coordination services today including:   The Care Coordination services include support from the care team which includes your Nurse Coordinator, Clinical Social Worker, or Pharmacist.  The Care Coordination team is here to help remove barriers to the health concerns and goals most important to you. Care Coordination services are voluntary, and the patient may decline or stop services at any time by request to their care team member.   Care Coordination Consent Status: Patient agreed to services and verbal consent obtained.   Follow up plan:  Telephone appointment with care coordination team member scheduled for:  12/19/22  Encounter Outcome:  Pt. Scheduled   [facility] Coordination Care Guide  Direct Dial: [phone]

## 2022-12-19 ENCOUNTER — Ambulatory Visit: Payer: Self-pay | Admitting: *Deleted

## 2022-12-19 NOTE — Patient Outreach (Signed)
  Care Coordination   12/19/2022 Name: Joan Webb MRN: [id number] DOB: [birth year]   Care Coordination Outreach Attempts:  An unsuccessful telephone outreach was attempted for a scheduled appointment today.  Follow Up Plan:  Additional outreach attempts will be made to offer the patient care coordination information and services.   Encounter Outcome:  No Answer   Care Coordination Interventions:  No, not indicated    Demetrios Loll, BSN, RN-BC RN Care Coordinator [facility]  [facility] Direct Dial: [phone] Main #: [phone]

## 2022-12-25 ENCOUNTER — Telehealth: Payer: Self-pay | Admitting: *Deleted

## 2022-12-25 NOTE — Progress Notes (Signed)
  Care Coordination Note  12/25/2022 Name: Cia Sharf MRN: [id number] DOB: 1970-08-03  Cristiana Schettler is a 52 y.o. year old female who is a primary care patient of Hasanaj, Myra Gianotti, MD and is actively engaged with the care management team. I reached out to [name] by phone today to assist with re-scheduling an initial visit with the RN Case Manager  Follow up plan: Unsuccessful telephone outreach attempt made.  [facility]  Care Coordination Care Guide  Direct Dial: [phone]

## 2022-12-26 NOTE — Progress Notes (Signed)
  Care Coordination Note  12/26/2022 Name: Joan Webb MRN: [id number] DOB: 12/13/1970  Joan Webb is a 52 y.o. year old female who is a primary care patient of Hasanaj, Myra Gianotti, MD and is actively engaged with the care management team. I reached out to [name] by phone today to assist with re-scheduling an initial visit with the RN Case Manager  Follow up plan: Unsuccessful telephone outreach attempt made. We have been unable to make contact with the patient for follow up. The care management team is available to follow up with the patient after provider conversation with the patient regarding recommendation for care management engagement and subsequent re-referral to the care management team.   [facility] Coordination Care Guide  Direct Dial: [phone]

## 2022-12-26 NOTE — Progress Notes (Signed)
  Care Coordination Note  12/26/2022 Name: Lucindy Koeneman MRN: [id number] DOB: 12-27-1970  [name] is a 52 y.o. year old female who is a primary care patient of Hasanaj, Myra Gianotti, MD and is actively engaged with the care management team. I reached out to [name] by phone today to assist with re-scheduling an initial visit with the RN Case Manager  Follow up plan: Telephone appointment with care management team member scheduled for:01/06/23  [facility] Coordination Care Guide  Direct Dial: [phone]

## 2022-12-30 DIAGNOSIS — Z0000 Encounter for general adult medical examination without abnormal findings: Secondary | ICD-10-CM | POA: Diagnosis not present

## 2022-12-30 DIAGNOSIS — I2510 Atherosclerotic heart disease of native coronary artery without angina pectoris: Secondary | ICD-10-CM | POA: Diagnosis not present

## 2022-12-30 DIAGNOSIS — E1143 Type 2 diabetes mellitus with diabetic autonomic (poly)neuropathy: Secondary | ICD-10-CM | POA: Diagnosis not present

## 2022-12-30 DIAGNOSIS — G4730 Sleep apnea, unspecified: Secondary | ICD-10-CM | POA: Diagnosis not present

## 2022-12-30 DIAGNOSIS — Z8673 Personal history of transient ischemic attack (TIA), and cerebral infarction without residual deficits: Secondary | ICD-10-CM | POA: Diagnosis not present

## 2022-12-30 DIAGNOSIS — M5418 Radiculopathy, sacral and sacrococcygeal region: Secondary | ICD-10-CM | POA: Diagnosis not present

## 2022-12-30 DIAGNOSIS — E785 Hyperlipidemia, unspecified: Secondary | ICD-10-CM | POA: Diagnosis not present

## 2022-12-30 DIAGNOSIS — I10 Essential (primary) hypertension: Secondary | ICD-10-CM | POA: Diagnosis not present

## 2023-01-06 ENCOUNTER — Ambulatory Visit: Payer: Self-pay | Admitting: *Deleted

## 2023-01-06 ENCOUNTER — Encounter: Payer: Self-pay | Admitting: *Deleted

## 2023-01-06 NOTE — Patient Outreach (Signed)
Care Coordination   Initial Visit Note   01/06/2023 Name: Joan Webb MRN: [id number] DOB: [birth year]  Joan Webb is a 52 y.o. year old female who sees Joan Webb, Joan Gianotti, MD for primary care. I spoke with  Joan Webb by phone today.  What matters to the patients health and wellness today?  Ongoing self health management    Goals Addressed             This Visit's Progress    COMPLETED: Care Coordination Services (No Follow-up Needed)       Care Coordination Goals: Patient will follow-up with PCP in 3 months for diabetes follow-up and A1C Patient will reach out to provider with any blood sugar readings less than 70 or greater than 200 Patient will reach out to VBCI/Population Health Care Management Team at [phone] if any resources or care coordination services are needed in the future         SDOH assessments and interventions completed:  Yes  SDOH Interventions Today    Flowsheet Row Most Recent Value  SDOH Interventions   Food Insecurity Interventions Intervention Not Indicated  Housing Interventions Intervention Not Indicated  Transportation Interventions Intervention Not Indicated  Financial Strain Interventions Intervention Not Indicated  Physical Activity Interventions Intervention Not Indicated        Care Coordination Interventions:  Yes, provided  Interventions Today    Flowsheet Row Most Recent Value  Chronic Disease   Chronic disease during today's visit Diabetes  General Interventions   General Interventions Discussed/Reviewed General Interventions Discussed, General Interventions Reviewed, Labs, Durable Medical Equipment (DME), Doctor Visits  Labs Hgb A1c every 6 months  Doctor Visits Discussed/Reviewed Doctor Visits Discussed, Doctor Visits Reviewed, Annual Wellness Visits, PCP  [saw PCP last week for follow-up]  Durable Medical Equipment (DME) Glucomoter  [Has not checked blood sugar today. Unsure of reading yesterday. Blood sugar is labile  but reports no readings below 70 but frequent readings over 200. Usually has a headache and dizziness if over 200.]  PCP/Specialist Visits Compliance with follow-up visit  [schedule F/U with Joan Webb in 6 months.]  Exercise Interventions   Exercise Discussed/Reviewed Exercise Discussed, Exercise Reviewed, Physical Activity, Weight Managment  Physical Activity Discussed/Reviewed Physical Activity Discussed, Physical Activity Reviewed, Types of exercise  [Patient swims 3 days a week for an hour or so. Outdoors when warm and indoors at the [facility] when cooler.]  Weight Management Weight loss  [has lost 14 lbs over the past 6 months since starting ozempic]  Education Interventions   Education Provided Provided Education  Provided Verbal Education On Nutrition, Labs, Blood Sugar Monitoring, When to see the doctor, Exercise, Medication, [employer] Reviewed Hgb A1c  [10/01/22 A1C 7.8%]  Mental Health Interventions   Mental Health Discussed/Reviewed Mental Health Discussed, Mental Health Reviewed  Nutrition Interventions   Nutrition Discussed/Reviewed Nutrition Discussed, Nutrition Reviewed, Carbohydrate meal planning, Portion sizes  [Eating an egg in morning & a larger meal around 3:00 or 4:00.  Eats fish 3 days a week. Limiting starchy carbs like bread and pasta. They make her very tired. Has to eat small meals since starting Ozempic. Can't hold as much. 3 meals a day would be ideal]  Pharmacy Interventions   Pharmacy Dicussed/Reviewed Pharmacy Topics Discussed, Pharmacy Topics Reviewed, Medications and their functions  [blood sugar has been more stable since starting ozempic. Taking medications regularly without any concerns. Has [employer] Dual Complete with [employer] so cost isn't a factor.]  Safety Interventions   Safety  Discussed/Reviewed Safety Discussed, Safety Reviewed, Fall Risk, Home Safety  Home Safety Assistive Devices  [has a cane and walker but doesn't have to use  them. No recent falls.]       Follow up plan: No further intervention required. Patient declined further follow-up. She feels that she is on the right path for managing diabetes and has the education, support, and resources that she needs. She will reach out to the Care Management team in the future if her situation changes.   Encounter Outcome:  Pt. Visit Completed   Demetrios Loll, BSN, RN-BC RN Care Coordinator [facility]  [facility] Direct Dial: [phone] Main #: [phone]

## 2023-01-07 ENCOUNTER — Ambulatory Visit: Payer: 59 | Admitting: Obstetrics & Gynecology

## 2023-02-19 ENCOUNTER — Other Ambulatory Visit (HOSPITAL_COMMUNITY)
Admission: RE | Admit: 2023-02-19 | Discharge: 2023-02-19 | Disposition: A | Discharge status: Home / Self Care | Payer: 59 | Source: Ambulatory Visit | Attending: Obstetrics & Gynecology | Admitting: Obstetrics & Gynecology

## 2023-02-19 ENCOUNTER — Ambulatory Visit (INDEPENDENT_AMBULATORY_CARE_PROVIDER_SITE_OTHER): Payer: 59 | Admitting: Obstetrics & Gynecology

## 2023-02-19 ENCOUNTER — Encounter: Payer: Self-pay | Admitting: Obstetrics & Gynecology

## 2023-02-19 VITALS — BP 109/66 | HR 85 | Wt 175.6 lb

## 2023-02-19 DIAGNOSIS — Z01411 Encounter for gynecological examination (general) (routine) with abnormal findings: Secondary | ICD-10-CM | POA: Diagnosis not present

## 2023-02-19 DIAGNOSIS — N898 Other specified noninflammatory disorders of vagina: Secondary | ICD-10-CM | POA: Insufficient documentation

## 2023-02-19 DIAGNOSIS — Z1231 Encounter for screening mammogram for malignant neoplasm of breast: Secondary | ICD-10-CM

## 2023-02-19 DIAGNOSIS — R300 Dysuria: Secondary | ICD-10-CM | POA: Diagnosis not present

## 2023-02-19 NOTE — Progress Notes (Signed)
WELL-WOMAN EXAMINATION Patient name: Joan Webb MRN [id number]  Date of birth: 1970/09/05 Chief Complaint:   Gynecologic Exam  History of Present Illness:   Joan Webb is a 52 y.o. G1P0010 PM female being seen today for a routine well-woman exam.   At her annual in 2023, she had noted vaginal irritation and treated with both Diflucan and lotrisone.  Today she notes that the medication did work, but her symptoms returned.  She notes that for the past 4 days she has had external vaginal itching.  Symptoms seem to come and go.  Of note she was treated in May 2024 for similar symptoms.  Is not using any OTC remedies  Urinary odor and occasional vaginal itching.  Occasional dysuria.  No vaginal bleeding  Patient reports history of uncontrolled DM, now on Ozempic and notes better control of her sugars.     No LMP recorded. Patient is postmenopausal.  The current method of family planning is post menopausal status.   Last pap 07/2021.  Last mammogram: last 08/2021- ordered.     07/25/2021    3:50 PM 05/26/2019    4:39 PM  Depression screen PHQ 2/9  Decreased Interest 2 3  Down, Depressed, Hopeless 3 2  PHQ - 2 Score 5 5  Altered sleeping 3 3  Tired, decreased energy 3 3  Change in appetite 3 2  Feeling bad or failure about yourself  1 2  Trouble concentrating 3 3  Moving slowly or fidgety/restless 3 3  Suicidal thoughts 0 1  PHQ-9 Score 21 22  Difficult doing work/chores  Extremely dIfficult      Review of Systems:   Pertinent items are noted in HPI Denies any headaches, blurred vision, fatigue, shortness of breath, chest pain, abdominal pain, bowel movements, urination, or intercourse unless otherwise stated above.  Pertinent History Reviewed:  Reviewed past medical,surgical, social and family history.  Reviewed problem list, medications and allergies. Physical Assessment:   Vitals:   02/19/23 1436  BP: 109/66  Pulse: 85  Weight: 175 lb 9.6 oz (79.7 kg)  Body mass  index is 32.12 kg/m.        Physical Examination:   General appearance - well appearing, and in no distress  Mental status - alert, oriented to person, place, and time  Psych:  She has a normal mood and affect  Skin - warm and dry, normal color, no suspicious lesions noted  Chest - effort normal, all lung fields clear to auscultation bilaterally  Heart - normal rate and regular rhythm  Neck:  midline trachea, no thyromegaly or nodules  Breasts - breasts appear normal, no suspicious masses, no skin or nipple changes or  axillary nodes  Abdomen - soft, nontender, nondistended, no masses or organomegaly  Pelvic - VULVA: normal appearing vulva with no masses, tenderness or lesions  VAGINA: normal appearing vagina with normal color and discharge, no lesions  CERVIX: normal appearing cervix without discharge or lesions, no CMT  UTERUS: uterus is felt to be normal size, shape, consistency and nontender   ADNEXA: No adnexal masses or tenderness noted.  Extremities:  No swelling or varicosities noted  Chaperone: Faith Rogue     Assessment & Plan:  1) Well-Woman Exam -Pap up-to-date, reviewed ASCCP guidelines -Mammogram ordered  2) vaginal irritation -Discussed importance of good glycemic control to help improve symptoms -Vaginitis panel collected today, further management pending results -For now if a yeast infection is noted we will send in Diflucan with refill to take  as needed.  Should she run out of refills prior to annual, would advise that she follow-up for further treatment  3) Dysuria -Plan to rule out underlying infection UA and culture obtained  Orders Placed This Encounter  Procedures   Urine Culture   MM 3D SCREENING MAMMOGRAM BILATERAL BREAST   Urinalysis    Meds: No orders of the defined types were placed in this encounter.   Follow-up: Return in about 1 year (around 02/19/2024) for Annual.   Myna Hidalgo, DO Attending Obstetrician & Gynecologist, Faculty  Practice Center for [facility], [facility]

## 2023-02-20 LAB — URINALYSIS
Bilirubin, UA: NEGATIVE
Ketones, UA: NEGATIVE
Nitrite, UA: POSITIVE — AB
Protein,UA: NEGATIVE
RBC, UA: NEGATIVE
Specific Gravity, UA: >=1.03 — AB (ref 1.005–1.030)
Urobilinogen, Ur: 1 mg/dL (ref 0.2–1.0)
pH, UA: 5.5 (ref 5.0–7.5)

## 2023-02-23 ENCOUNTER — Other Ambulatory Visit: Payer: Self-pay | Admitting: Obstetrics & Gynecology

## 2023-02-23 DIAGNOSIS — N760 Acute vaginitis: Secondary | ICD-10-CM

## 2023-02-23 LAB — CERVICOVAGINAL ANCILLARY ONLY
Bacterial Vaginitis (gardnerella): POSITIVE — AB
Candida Vaginitis: POSITIVE — AB
Comment: NEGATIVE
Comment: NEGATIVE
Comment: NEGATIVE
Trichomonas: NEGATIVE

## 2023-02-23 MED ORDER — METRONIDAZOLE 500 MG PO TABS
500.0000 mg | ORAL_TABLET | Freq: Two times a day (BID) | ORAL | 0 refills | Status: AC
Start: 2023-02-23 — End: 2023-03-02

## 2023-02-23 MED ORDER — FLUCONAZOLE 150 MG PO TABS
ORAL_TABLET | ORAL | 6 refills | Status: DC
Start: 2023-02-23 — End: 2023-10-28

## 2023-02-23 NOTE — Progress Notes (Signed)
Order for BV and yeast treatment

## 2023-02-24 LAB — URINE CULTURE

## 2023-02-25 ENCOUNTER — Other Ambulatory Visit: Payer: Self-pay | Admitting: Obstetrics & Gynecology

## 2023-02-25 DIAGNOSIS — N3001 Acute cystitis with hematuria: Secondary | ICD-10-CM

## 2023-02-25 MED ORDER — NITROFURANTOIN MONOHYD MACRO 100 MG PO CAPS
100.0000 mg | ORAL_CAPSULE | Freq: Two times a day (BID) | ORAL | 0 refills | Status: AC
Start: 2023-02-25 — End: 2023-03-04

## 2023-02-25 NOTE — Progress Notes (Signed)
Rx for UTI  [name], DO Attending [place], [facility] for [facility], [place]

## 2023-03-16 DIAGNOSIS — J209 Acute bronchitis, unspecified: Secondary | ICD-10-CM | POA: Diagnosis not present

## 2023-03-16 DIAGNOSIS — J069 Acute upper respiratory infection, unspecified: Secondary | ICD-10-CM | POA: Diagnosis not present

## 2023-03-16 DIAGNOSIS — J9801 Acute bronchospasm: Secondary | ICD-10-CM | POA: Diagnosis not present

## 2023-03-16 DIAGNOSIS — U071 COVID-19: Secondary | ICD-10-CM | POA: Diagnosis not present

## 2023-03-16 DIAGNOSIS — R030 Elevated blood-pressure reading, without diagnosis of hypertension: Secondary | ICD-10-CM | POA: Diagnosis not present

## 2023-04-02 DIAGNOSIS — G4730 Sleep apnea, unspecified: Secondary | ICD-10-CM | POA: Diagnosis not present

## 2023-04-02 DIAGNOSIS — E785 Hyperlipidemia, unspecified: Secondary | ICD-10-CM | POA: Diagnosis not present

## 2023-04-02 DIAGNOSIS — I2510 Atherosclerotic heart disease of native coronary artery without angina pectoris: Secondary | ICD-10-CM | POA: Diagnosis not present

## 2023-04-02 DIAGNOSIS — Z8673 Personal history of transient ischemic attack (TIA), and cerebral infarction without residual deficits: Secondary | ICD-10-CM | POA: Diagnosis not present

## 2023-04-02 DIAGNOSIS — E1143 Type 2 diabetes mellitus with diabetic autonomic (poly)neuropathy: Secondary | ICD-10-CM | POA: Diagnosis not present

## 2023-04-02 DIAGNOSIS — M5418 Radiculopathy, sacral and sacrococcygeal region: Secondary | ICD-10-CM | POA: Diagnosis not present

## 2023-04-02 DIAGNOSIS — I10 Essential (primary) hypertension: Secondary | ICD-10-CM | POA: Diagnosis not present

## 2023-04-09 DIAGNOSIS — R809 Proteinuria, unspecified: Secondary | ICD-10-CM | POA: Diagnosis not present

## 2023-07-02 DIAGNOSIS — E1143 Type 2 diabetes mellitus with diabetic autonomic (poly)neuropathy: Secondary | ICD-10-CM | POA: Diagnosis not present

## 2023-07-02 DIAGNOSIS — M5418 Radiculopathy, sacral and sacrococcygeal region: Secondary | ICD-10-CM | POA: Diagnosis not present

## 2023-07-02 DIAGNOSIS — G4730 Sleep apnea, unspecified: Secondary | ICD-10-CM | POA: Diagnosis not present

## 2023-07-02 DIAGNOSIS — Z8673 Personal history of transient ischemic attack (TIA), and cerebral infarction without residual deficits: Secondary | ICD-10-CM | POA: Diagnosis not present

## 2023-07-02 DIAGNOSIS — I10 Essential (primary) hypertension: Secondary | ICD-10-CM | POA: Diagnosis not present

## 2023-07-02 DIAGNOSIS — E785 Hyperlipidemia, unspecified: Secondary | ICD-10-CM | POA: Diagnosis not present

## 2023-07-02 DIAGNOSIS — F17210 Nicotine dependence, cigarettes, uncomplicated: Secondary | ICD-10-CM | POA: Diagnosis not present

## 2023-07-02 DIAGNOSIS — I2510 Atherosclerotic heart disease of native coronary artery without angina pectoris: Secondary | ICD-10-CM | POA: Diagnosis not present

## 2023-07-11 DIAGNOSIS — J019 Acute sinusitis, unspecified: Secondary | ICD-10-CM | POA: Diagnosis not present

## 2023-07-31 DIAGNOSIS — Z8673 Personal history of transient ischemic attack (TIA), and cerebral infarction without residual deficits: Secondary | ICD-10-CM | POA: Diagnosis not present

## 2023-07-31 DIAGNOSIS — I2510 Atherosclerotic heart disease of native coronary artery without angina pectoris: Secondary | ICD-10-CM | POA: Diagnosis not present

## 2023-07-31 DIAGNOSIS — E1143 Type 2 diabetes mellitus with diabetic autonomic (poly)neuropathy: Secondary | ICD-10-CM | POA: Diagnosis not present

## 2023-07-31 DIAGNOSIS — F17210 Nicotine dependence, cigarettes, uncomplicated: Secondary | ICD-10-CM | POA: Diagnosis not present

## 2023-07-31 DIAGNOSIS — M5418 Radiculopathy, sacral and sacrococcygeal region: Secondary | ICD-10-CM | POA: Diagnosis not present

## 2023-08-19 DIAGNOSIS — J301 Allergic rhinitis due to pollen: Secondary | ICD-10-CM | POA: Diagnosis not present

## 2023-08-19 DIAGNOSIS — I10 Essential (primary) hypertension: Secondary | ICD-10-CM | POA: Diagnosis not present

## 2023-08-26 ENCOUNTER — Ambulatory Visit (INDEPENDENT_AMBULATORY_CARE_PROVIDER_SITE_OTHER): Admitting: Obstetrics & Gynecology

## 2023-08-26 ENCOUNTER — Encounter (INDEPENDENT_AMBULATORY_CARE_PROVIDER_SITE_OTHER): Payer: Self-pay

## 2023-08-26 ENCOUNTER — Encounter: Payer: Self-pay | Admitting: Obstetrics & Gynecology

## 2023-08-26 ENCOUNTER — Other Ambulatory Visit (HOSPITAL_COMMUNITY)
Admission: RE | Admit: 2023-08-26 | Discharge: 2023-08-26 | Disposition: A | Discharge status: Home / Self Care | Source: Ambulatory Visit | Attending: Obstetrics & Gynecology | Admitting: Obstetrics & Gynecology

## 2023-08-26 VITALS — BP 116/71 | HR 73 | Ht 62.0 in | Wt 181.0 lb

## 2023-08-26 DIAGNOSIS — B3732 Chronic candidiasis of vulva and vagina: Secondary | ICD-10-CM | POA: Insufficient documentation

## 2023-08-26 DIAGNOSIS — A609 Anogenital herpesviral infection, unspecified: Secondary | ICD-10-CM

## 2023-08-26 DIAGNOSIS — Z113 Encounter for screening for infections with a predominantly sexual mode of transmission: Secondary | ICD-10-CM | POA: Diagnosis present

## 2023-08-26 DIAGNOSIS — R300 Dysuria: Secondary | ICD-10-CM

## 2023-08-26 DIAGNOSIS — A6004 Herpesviral vulvovaginitis: Secondary | ICD-10-CM | POA: Insufficient documentation

## 2023-08-26 MED ORDER — VALACYCLOVIR HCL 500 MG PO TABS: 500.0000 mg | ORAL_TABLET | Freq: Two times a day (BID) | ORAL | 3 refills | Status: AC

## 2023-08-26 NOTE — Progress Notes (Signed)
 GYN VISIT Patient name: Joan Webb MRN [id number]  Date of birth: Aug 05, 1970 Chief Complaint:   Follow-up (Pt wants to be std tested)  History of Present Illness:   Joan Webb is a 53 y.o. G1P0010 PM female being seen today for the following concerns:  On occasion will need to take the Diflucan for itching/irritation, which seems to help her symptoms.  However, notes something different.  Today she notes that there may be a line of bumps- last 1-2 weeks.  Feels like they have stayed the same.  Denies vaginal discharge or odor.  Notes burning with urination, no hematuria.  Notes long-standing history of urinary frequency.  PMHx: DM- A1c 9.3, currently on ozempic  Sexually active with new partner  No LMP recorded. Patient is postmenopausal.    Review of Systems:   Pertinent items are noted in HPI Denies fever/chills, dizziness, headaches, visual disturbances, fatigue, shortness of breath, chest pain, abdominal pain, vomiting. Pertinent History Reviewed:   Past Surgical History:  Procedure Laterality Date   CARDIAC CATHETERIZATION     CORONARY ARTERY BYPASS GRAFT N/A 03/25/2019   Procedure: CORONARY ARTERY BYPASS GRAFTING (CABG) X THREE, ON PUMP, USING LEFT INTERNAL MAMMARY ARTERY AND LEFT GREATER SAPHENOUS VEIN HARVESTED ENDOSCOPICALLY;  Surgeon: Kerin Perna, MD;  Location: [facility] OR;  Service: Open Heart Surgery;  Laterality: N/A;   LEFT HEART CATH AND CORONARY ANGIOGRAPHY N/A 03/23/2019   Procedure: LEFT HEART CATH AND CORONARY ANGIOGRAPHY;  Surgeon: Kathleene Hazel, MD;  Location: [facility] INVASIVE CV LAB;  Service: Cardiovascular;  Laterality: N/A;   None     TEE WITHOUT CARDIOVERSION N/A 03/25/2019   Procedure: TRANSESOPHAGEAL ECHOCARDIOGRAM (TEE);  Surgeon: Donata Clay, Theron Arista, MD;  Location: [facility] OR;  Service: Open Heart Surgery;  Laterality: N/A;    Past Medical History:  Diagnosis Date   Anxiety    Coronary artery disease    Diabetes (HCC)    Hyperlipidemia     Myocardial infarction (HCC)    Tobacco abuse    Reviewed problem list, medications and allergies. Physical Assessment:   Vitals:   08/26/23 0914  BP: 116/71  Pulse: 73  Weight: 181 lb (82.1 kg)  Height: 5\' 2"  (1.575 m)  Body mass index is 33.11 kg/m.       Physical Examination:   General appearance: alert, well appearing, and in no distress  Psych: mood appropriate, normal affect  Skin: warm & dry   Cardiovascular: normal heart rate noted  Respiratory: normal respiratory effort, no distress  Abdomen: soft, non-tender   Pelvic: VULVA: right labia minora with 1cm ulcer- painful to palpation.  No active drainage.  No other abnormalities on external genitalia noted. Normal pink vaginal mucosa- minimal white discharge.  Normal appearing cervix- no lesions or abnormalities noted.  Extremities: no edema   Chaperone: Faith Rogue    Assessment & Plan:  1) HSV -pt notes h/o oral HSV, typically around change of seasons.  Does not recall prior genital HSV -reviewed findings, treatment and counseling regarding conversation with future partners -discussed safe sex practices and encouraged pt to f/u prn for STI screening -STI screening to be completed today  2) h/o recurrent yeast -continue with Diflucan prn -will also r/o vaginitis today  Meds ordered this encounter  Medications   valACYclovir (VALTREX) 500 MG tablet    Sig: Take 1 tablet (500 mg total) by mouth 2 (two) times daily for 5 days.    Dispense:  10 tablet    Refill:  3  Orders Placed This Encounter  Procedures   Urine Culture   Herpes simplex virus culture   Urinalysis   RPR   HIV Antibody (routine testing w rflx)    Return in about 1 year (around 08/25/2024), or if symptoms worsen or fail to improve, for Annual.   Myna Hidalgo, DO Attending Obstetrician & Gynecologist, Faculty Practice Center for [facility], [facility]

## 2023-08-27 ENCOUNTER — Encounter: Payer: Self-pay | Admitting: Obstetrics & Gynecology

## 2023-08-27 LAB — CERVICOVAGINAL ANCILLARY ONLY
Bacterial Vaginitis (gardnerella): NEGATIVE
Candida Glabrata: NEGATIVE
Candida Vaginitis: POSITIVE — AB
Chlamydia: NEGATIVE
Comment: NEGATIVE
Comment: NEGATIVE
Comment: NEGATIVE
Comment: NEGATIVE
Comment: NEGATIVE
Comment: NORMAL
Neisseria Gonorrhea: NEGATIVE
Trichomonas: NEGATIVE

## 2023-08-27 LAB — RPR: RPR Ser Ql: NONREACTIVE

## 2023-08-27 LAB — HIV ANTIBODY (ROUTINE TESTING W REFLEX): HIV Screen 4th Generation wRfx: NONREACTIVE

## 2023-08-28 LAB — HERPES SIMPLEX VIRUS CULTURE

## 2023-09-21 DIAGNOSIS — R197 Diarrhea, unspecified: Secondary | ICD-10-CM | POA: Diagnosis not present

## 2023-09-21 DIAGNOSIS — E860 Dehydration: Secondary | ICD-10-CM | POA: Diagnosis not present

## 2023-10-26 ENCOUNTER — Encounter: Payer: Self-pay | Admitting: Obstetrics & Gynecology

## 2023-10-28 ENCOUNTER — Other Ambulatory Visit (HOSPITAL_COMMUNITY)
Admission: RE | Admit: 2023-10-28 | Discharge: 2023-10-28 | Disposition: A | Discharge status: Home / Self Care | Source: Ambulatory Visit | Attending: Obstetrics & Gynecology | Admitting: Obstetrics & Gynecology

## 2023-10-28 ENCOUNTER — Other Ambulatory Visit: Admitting: *Deleted

## 2023-10-28 ENCOUNTER — Other Ambulatory Visit: Payer: Self-pay | Admitting: Adult Health

## 2023-10-28 DIAGNOSIS — R8290 Unspecified abnormal findings in urine: Secondary | ICD-10-CM | POA: Diagnosis not present

## 2023-10-28 DIAGNOSIS — N898 Other specified noninflammatory disorders of vagina: Secondary | ICD-10-CM | POA: Diagnosis present

## 2023-10-28 LAB — POCT URINALYSIS DIPSTICK
Glucose, UA: NEGATIVE
Ketones, UA: NEGATIVE
Nitrite, UA: POSITIVE
Protein, UA: NEGATIVE

## 2023-10-28 MED ORDER — NITROFURANTOIN MONOHYD MACRO 100 MG PO CAPS: 100.0000 mg | ORAL_CAPSULE | Freq: Two times a day (BID) | ORAL | 0 refills | Status: DC

## 2023-10-28 NOTE — Progress Notes (Signed)
   NURSE VISIT- UTI SYMPTOMS   SUBJECTIVE:  Joan Webb is a 53 y.o. G8P0010 female here for UTI symptoms. She is a GYN patient. She reports strong odor with urine. Pt also reports vaginal itching X 2 days.  OBJECTIVE:  There were no vitals taken for this visit.  Appears well, in no apparent distress  Results for orders placed or performed in visit on 10/28/23 (from the past 24 hours)  POCT Urinalysis Dipstick   Collection Time: 10/28/23  3:16 PM  Result Value Ref Range   Color, UA     Clarity, UA     Glucose, UA Negative Negative   Bilirubin, UA     Ketones, UA neg    Spec Grav, UA     Blood, UA trace    pH, UA     Protein, UA Negative Negative   Urobilinogen, UA     Nitrite, UA positive    Leukocytes, UA Trace (A) Negative   Appearance     Odor      ASSESSMENT: GYN patient with UTI symptoms and positive nitrites  PLAN: Discussed with Joan Webb, AGNP   Rx sent by provider today: Yes Urine culture sent. Swab sent for GC/CHL, trich, BV and yeast. Call or return to clinic prn if these symptoms worsen or fail to improve as anticipated. Follow-up: as needed   Joan Webb  10/28/2023 4:01 PM

## 2023-10-28 NOTE — Progress Notes (Signed)
 Rx macrobid

## 2023-10-29 ENCOUNTER — Ambulatory Visit: Payer: Self-pay | Admitting: Adult Health

## 2023-10-29 LAB — MICROSCOPIC EXAMINATION
Casts: NONE SEEN /LPF
Epithelial Cells (non renal): >10 /HPF — AB (ref 0–10)

## 2023-10-29 LAB — URINALYSIS, ROUTINE W REFLEX MICROSCOPIC
Bilirubin, UA: NEGATIVE
Glucose, UA: NEGATIVE
Nitrite, UA: NEGATIVE
Specific Gravity, UA: 1.027 (ref 1.005–1.030)
Urobilinogen, Ur: 1 mg/dL (ref 0.2–1.0)
pH, UA: 5.5 (ref 5.0–7.5)

## 2023-10-30 LAB — CERVICOVAGINAL ANCILLARY ONLY
Bacterial Vaginitis (gardnerella): POSITIVE — AB
Candida Glabrata: NEGATIVE
Candida Vaginitis: POSITIVE — AB
Chlamydia: NEGATIVE
Comment: NEGATIVE
Comment: NEGATIVE
Comment: NEGATIVE
Comment: NEGATIVE
Comment: NEGATIVE
Comment: NORMAL
Neisseria Gonorrhea: NEGATIVE
Trichomonas: NEGATIVE

## 2023-10-31 LAB — URINE CULTURE

## 2023-11-02 MED ORDER — METRONIDAZOLE 0.75 % VA GEL: 1.0000 | Freq: Every day | VAGINAL | 0 refills | Status: AC

## 2023-11-02 MED ORDER — FLUCONAZOLE 150 MG PO TABS: ORAL_TABLET | ORAL | 1 refills | Status: DC

## 2023-11-04 DIAGNOSIS — E1122 Type 2 diabetes mellitus with diabetic chronic kidney disease: Secondary | ICD-10-CM | POA: Diagnosis not present

## 2023-11-04 DIAGNOSIS — F17210 Nicotine dependence, cigarettes, uncomplicated: Secondary | ICD-10-CM | POA: Diagnosis not present

## 2023-11-04 DIAGNOSIS — I10 Essential (primary) hypertension: Secondary | ICD-10-CM | POA: Diagnosis not present

## 2023-11-04 DIAGNOSIS — N182 Chronic kidney disease, stage 2 (mild): Secondary | ICD-10-CM | POA: Diagnosis not present

## 2023-11-04 DIAGNOSIS — J301 Allergic rhinitis due to pollen: Secondary | ICD-10-CM | POA: Diagnosis not present

## 2023-11-04 DIAGNOSIS — I2510 Atherosclerotic heart disease of native coronary artery without angina pectoris: Secondary | ICD-10-CM | POA: Diagnosis not present

## 2023-11-04 DIAGNOSIS — G4730 Sleep apnea, unspecified: Secondary | ICD-10-CM | POA: Diagnosis not present

## 2023-11-04 DIAGNOSIS — J40 Bronchitis, not specified as acute or chronic: Secondary | ICD-10-CM | POA: Diagnosis not present

## 2023-11-04 DIAGNOSIS — Z0000 Encounter for general adult medical examination without abnormal findings: Secondary | ICD-10-CM | POA: Diagnosis not present

## 2023-12-17 ENCOUNTER — Ambulatory Visit: Admitting: Cardiovascular Disease

## 2024-01-29 ENCOUNTER — Other Ambulatory Visit: Payer: Self-pay | Admitting: Adult Health

## 2024-02-24 NOTE — Progress Notes (Signed)
 Joan Webb                                          MRN: [id number]   02/24/2024   The VBCI Quality Team Specialist reviewed this patient medical record for the purposes of chart review for care gap closure. The following were reviewed: chart review for care gap closure-kidney health evaluation for diabetes:eGFR  and uACR.    VBCI Quality Team

## 2024-02-28 DIAGNOSIS — L02412 Cutaneous abscess of left axilla: Secondary | ICD-10-CM | POA: Diagnosis not present

## 2024-03-02 DIAGNOSIS — L03112 Cellulitis of left axilla: Secondary | ICD-10-CM | POA: Diagnosis not present

## 2024-03-02 DIAGNOSIS — E119 Type 2 diabetes mellitus without complications: Secondary | ICD-10-CM | POA: Diagnosis not present

## 2024-03-02 DIAGNOSIS — L02412 Cutaneous abscess of left axilla: Secondary | ICD-10-CM | POA: Diagnosis not present

## 2024-03-03 DIAGNOSIS — Z8673 Personal history of transient ischemic attack (TIA), and cerebral infarction without residual deficits: Secondary | ICD-10-CM | POA: Diagnosis not present

## 2024-03-03 DIAGNOSIS — E1122 Type 2 diabetes mellitus with diabetic chronic kidney disease: Secondary | ICD-10-CM | POA: Diagnosis not present

## 2024-03-03 DIAGNOSIS — G4730 Sleep apnea, unspecified: Secondary | ICD-10-CM | POA: Diagnosis not present

## 2024-03-03 DIAGNOSIS — N182 Chronic kidney disease, stage 2 (mild): Secondary | ICD-10-CM | POA: Diagnosis not present

## 2024-03-03 DIAGNOSIS — L0291 Cutaneous abscess, unspecified: Secondary | ICD-10-CM | POA: Diagnosis not present

## 2024-03-03 DIAGNOSIS — I2510 Atherosclerotic heart disease of native coronary artery without angina pectoris: Secondary | ICD-10-CM | POA: Diagnosis not present

## 2024-03-04 DIAGNOSIS — I2510 Atherosclerotic heart disease of native coronary artery without angina pectoris: Secondary | ICD-10-CM | POA: Diagnosis not present

## 2024-03-04 DIAGNOSIS — Z8673 Personal history of transient ischemic attack (TIA), and cerebral infarction without residual deficits: Secondary | ICD-10-CM | POA: Diagnosis not present

## 2024-03-04 DIAGNOSIS — N182 Chronic kidney disease, stage 2 (mild): Secondary | ICD-10-CM | POA: Diagnosis not present

## 2024-03-04 DIAGNOSIS — E1122 Type 2 diabetes mellitus with diabetic chronic kidney disease: Secondary | ICD-10-CM | POA: Diagnosis not present

## 2024-03-05 DIAGNOSIS — L03112 Cellulitis of left axilla: Secondary | ICD-10-CM | POA: Diagnosis not present

## 2024-03-08 ENCOUNTER — Ambulatory Visit: Attending: Cardiovascular Disease | Admitting: Cardiovascular Disease

## 2024-03-08 DIAGNOSIS — L0291 Cutaneous abscess, unspecified: Secondary | ICD-10-CM | POA: Diagnosis not present

## 2024-03-08 DIAGNOSIS — S41102A Unspecified open wound of left upper arm, initial encounter: Secondary | ICD-10-CM | POA: Diagnosis not present

## 2024-03-08 NOTE — Progress Notes (Deleted)
 No chief complaint on file.  History of Present Illness: 53 yo female with history of CAD, HLD, DM, postoperative CVA and tobacco abuse here today for cardiac follow up. She was admitted to [facility] 03/23/19 with an acute inferior ST elevation MI. Emergent cardiac cath 03/23/19 with multi-vessel CAD. She underwent 3V CABG (LIMA to LAD, SVG to Diagonal, SVG to PDA) on 03/25/19. Echo 03/24/19 with LVEF=60-65% without significant valve disease. She unfortunately suffered a CVA during the post-operative period and was discharged to inpatient rehab. 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 Plavix  was stopped and she was started on Eliquis . She is now off of Eliquis  and has been back on ASA and Plavix .   She is here today for follow up. The patient denies any chest pain, dyspnea, palpitations, lower extremity edema, orthopnea, PND, dizziness, near syncope or syncope.    Primary Care Physician: Orpha Yancey LABOR, MD  Past Medical History:  Diagnosis Date   Anxiety    Coronary artery disease    Diabetes (HCC)    Hyperlipidemia    Myocardial infarction (HCC)    Tobacco abuse     Past Surgical History:  Procedure Laterality Date   CARDIAC CATHETERIZATION     CORONARY ARTERY BYPASS GRAFT N/A 03/25/2019   Procedure: CORONARY ARTERY BYPASS GRAFTING (CABG) X THREE, ON PUMP, USING LEFT INTERNAL MAMMARY ARTERY AND LEFT GREATER SAPHENOUS VEIN HARVESTED ENDOSCOPICALLY;  Surgeon: Fleeta Hanford Coy, MD;  Location: [facility] OR;  Service: Open Heart Surgery;  Laterality: N/A;   LEFT HEART CATH AND CORONARY ANGIOGRAPHY N/A 03/23/2019   Procedure: LEFT HEART CATH AND CORONARY ANGIOGRAPHY;  Surgeon: Verlin Lonni BIRCH, MD;  Location: [facility] INVASIVE CV LAB;  Service: Cardiovascular;  Laterality: N/A;   None     TEE WITHOUT CARDIOVERSION N/A 03/25/2019   Procedure: TRANSESOPHAGEAL ECHOCARDIOGRAM (TEE);  Surgeon: Fleeta Hanford, Coy, MD;  Location: [facility] OR;  Service:  Open Heart Surgery;  Laterality: N/A;    Current Outpatient Medications  Medication Sig Dispense Refill   acetaminophen  (TYLENOL ) 325 MG tablet Take 1-2 tablets (325-650 mg total) by mouth every 4 (four) hours as needed for mild pain.     aspirin  EC 81 MG EC tablet Take 1 tablet (81 mg total) by mouth daily.     atorvastatin  (LIPITOR ) 80 MG tablet Take 1 tablet (80 mg total) by mouth daily at 6 PM. 30 tablet 1   BIOTIN PO Take by mouth.     buPROPion  (WELLBUTRIN  SR) 150 MG 12 hr tablet Take 150 mg by mouth 2 (two) times daily.     Cholecalciferol (QC VITAMIN D3) 125 MCG (5000 UT) TABS Take 1 tablet (5,000 Units total) by mouth daily. 30 tablet 1   citalopram (CELEXA) 40 MG tablet Take 40 mg by mouth daily.     clopidogrel  (PLAVIX ) 75 MG tablet Take 1 tablet (75 mg total) by mouth daily. 90 tablet 3   Cyanocobalamin (VITAMIN B-12 PO) Take by mouth.     fluconazole  (DIFLUCAN ) 150 MG tablet Take 1 now and 1 in 3 days 2 tablet 1   gabapentin  (NEURONTIN ) 600 MG tablet Take 1 tablet (600 mg total) by mouth 3 (three) times daily. 90 tablet 1   glipiZIDE  (GLUCOTROL ) 10 MG tablet Take 1 tablet (10 mg total) by mouth 2 (two) times daily before a meal. 60 tablet 1   JARDIANCE 10 MG TABS tablet Take 10 mg by mouth  every morning.     metoprolol  tartrate (LOPRESSOR ) 25 MG tablet Take 1 tablet (25 mg total) by mouth 2 (two) times daily. 180 tablet 3   metroNIDAZOLE  (METROGEL ) 0.75 % vaginal gel Place 1 Applicatorful vaginally at bedtime. 70 g 0   nitrofurantoin , macrocrystal-monohydrate, (MACROBID ) 100 MG capsule Take 1 capsule by mouth twice daily 14 capsule 0   Omega-3 Fatty Acids (FISH OIL PO) Take by mouth.     OZEMPIC, 1 MG/DOSE, 4 MG/3ML SOPN Inject into the skin.     VITAMIN E PO Take by mouth.     No current facility-administered medications for this visit.    No Known Allergies  Social History   Socioeconomic History   Marital status: Unknown    Spouse name: Not on file   Number of  children: Not on file   Years of education: Not on file   Highest education level: Not on file  Occupational History   Not on file  Tobacco Use   Smoking status: Every Day    Types: Cigarettes   Smokeless tobacco: Never  Vaping Use   Vaping status: Never Used  Substance and Sexual Activity   Alcohol use: Not Currently   Drug use: Not Currently   Sexual activity: Yes    Birth control/protection: Post-menopausal  Other Topics Concern   Not on file  Social History Narrative   Not on file   Social Drivers of Health   Financial Resource Strain: Low Risk  (01/06/2023)   Overall Financial Resource Strain (CARDIA)    Difficulty of Paying Living Expenses: Not very hard  Food Insecurity: No Food Insecurity (01/06/2023)   Hunger Vital Sign    Worried About Running Out of Food in the Last Year: Never true    Ran Out of Food in the Last Year: Never true  Transportation Needs: No Transportation Needs (01/06/2023)   PRAPARE - Administrator, Civil Service (Medical): No    Lack of Transportation (Non-Medical): No  Physical Activity: Sufficiently Active (01/06/2023)   Exercise Vital Sign    Days of Exercise per Week: 3 days    Minutes of Exercise per Session: 60 min  Stress: Stress Concern Present (07/25/2021)   [employer] of Occupational Health - Occupational Stress Questionnaire    Feeling of Stress : Very much  Social Connections: Unknown (10/01/2021)   Received from [facility]   Social Network    Social Network: Not on file  Recent Concern: Social Connections - Socially Isolated (07/25/2021)   Social Connection and Isolation Panel    Frequency of Communication with Friends and Family: More than three times a week    Frequency of Social Gatherings with Friends and Family: Three times a week    Attends Religious Services: Never    Active Member of Clubs or Organizations: No    Attends Banker Meetings: Never    Marital Status: Divorced  Catering manager  Violence: Unknown (08/23/2021)   Received from [facility]   HITS    Physically Hurt: Not on file    Insult or Talk Down To: Not on file    Threaten Physical Harm: Not on file    Scream or Curse: Not on file    Family History  Problem Relation Age of Onset   Heart attack Father     Review of Systems:  As stated in the HPI and otherwise negative.   There were no vitals taken for this visit.  Physical Examination: General:  Well developed, well nourished, NAD  HEENT: OP clear, mucus membranes moist  SKIN: warm, dry. No rashes. Neuro: No focal deficits  Musculoskeletal: Muscle strength 5/5 all ext  Psychiatric: Mood and affect normal  Neck: No JVD, no carotid bruits, no thyromegaly, no lymphadenopathy.  Lungs:Clear bilaterally, no wheezes, rhonci, crackles Cardiovascular: Regular rate and rhythm. No murmurs, gallops or rubs. Abdomen:Soft. Bowel sounds present. Non-tender.  Extremities: No lower extremity edema. Pulses are 2 + in the bilateral DP/PT.  EKG:  EKG is *** ordered today. The ekg ordered today demonstrates    Recent Labs: No results found for requested labs within last 365 days.   Lipid Panel    Component Value Date/Time   CHOL 112 03/28/2019 0408   TRIG 114 03/28/2019 0408   HDL 21 (L) 03/28/2019 0408   CHOLHDL 5.3 03/28/2019 0408   VLDL 23 03/28/2019 0408   LDLCALC 68 03/28/2019 0408     Wt Readings from Last 3 Encounters:  08/26/23 181 lb (82.1 kg)  02/19/23 175 lb 9.6 oz (79.7 kg)  10/09/22 170 lb (77.1 kg)    Assessment and Plan:   1. CAD s/p CABG without angina: No chest pain. Will continue DAPT with ASA and Plavix  given her CAD and stroke. Continue metoprolol  and Lipitor .     2. Hyperlipidemia: LDL ***. Continue Lipitor   3. Tobacco abuse: *** Is she still smoking?   Labs/ tests ordered today include:  No orders of the defined types were placed in this encounter.  Disposition:   F/U with me in 12  months  Signed, Lonni Cash,  MD 03/08/2024 6:22 AM    [facility] [address] [place], [place], [place]  [address] Phone: [phone]; Fax: [phone]

## 2024-03-09 ENCOUNTER — Encounter: Payer: Self-pay | Admitting: Cardiovascular Disease

## 2024-03-10 DIAGNOSIS — L0291 Cutaneous abscess, unspecified: Secondary | ICD-10-CM | POA: Diagnosis not present

## 2024-03-10 DIAGNOSIS — L02412 Cutaneous abscess of left axilla: Secondary | ICD-10-CM | POA: Diagnosis not present

## 2024-03-16 ENCOUNTER — Telehealth: Payer: Self-pay | Admitting: Cardiovascular Disease

## 2024-03-16 NOTE — Progress Notes (Signed)
 Joan Webb                                          MRN: [id number]   03/16/2024   The VBCI Quality Team Specialist reviewed this patient medical record for the purposes of chart review for care gap closure. The following were reviewed: chart review for care gap closure-kidney health evaluation for diabetes:uACR.    VBCI Quality Team

## 2024-03-16 NOTE — Telephone Encounter (Signed)
 Patient c/o Palpitations:  STAT if patient reporting lightheadedness, shortness of breath, or chest pain  How long have you had palpitations/irregular HR/ Afib? Are you having the symptoms now?   No - comes and go  Are you currently experiencing lightheadedness, SOB or CP? Sometimes  Do you have a history of afib (atrial fibrillation) or irregular heart rhythm?     No  Have you checked your BP or HR? (document readings if available):   127/63    Are you experiencing any other symptoms?  Fatigue, low energy   Patient is concerned she is having palpitations and pressure on the left side of her chest.

## 2024-03-16 NOTE — Telephone Encounter (Signed)
 Called pt. No answer, unable to leave a message, no voicemail set up.

## 2024-03-17 NOTE — Telephone Encounter (Signed)
Unable to reach pt or leave a message mailbox is not set up. 

## 2024-03-18 NOTE — Telephone Encounter (Signed)
Unable to reach pt or leave a message  

## 2024-03-21 NOTE — Telephone Encounter (Signed)
 I spoke with patient.  She reports being extremely tired and feels like she cannot get enough sleep.  Has seen PCP for this.  Will have palpitations sometimes in the morning but will also occur sometimes in the evening.  Chest pressure occasionally in the morning.  None in the last week.  Patient currently scheduled to see Dr Verlin on 06/17/24.  Requesting sooner appointment.  Appointment made for patient to see Hao Meng, PA on 11/24

## 2024-04-08 NOTE — Progress Notes (Unsigned)
 Cardiology Office Note:  .   Date:  04/11/2024  ID:  Joan Webb, DOB 1970/12/22, MRN [id number] PCP: Orpha Yancey LABOR, MD  [facility] Providers Cardiologist:  Lonni Cash, MD {  History of Present Illness: .   Aubriel Khanna is a 53 y.o. female  with PMHx of CAD s/p 3V CABG (LIMA to LAD, SVG to Diagonal, SVG to PDA) on 03/25/19, HLD, DM, postoperative CVA, DVT and tobacco abuse  who reports to [facility] office for follow up.   Last seen in heartcare OV in 2023 by Dr. Cash for follow up. Doing well from cardiac standpoint without any complaints. Continued on ASA 81 mg, Lipitor  80 mg daily, Plavix  75 mg daily, Jardiance 10 mg daily, Lisinopril 2.5 mg daily, Lopressor  25 mg BID.   Today, reports chest pain described as heavy, 7/10, located midsternal associated with exertion such as cleaning up that occurs once a week for less than 5 minutes and is resolved with rest and water.  Notes chest pain is less severe in intensity and character than previous episode in 2020.  On exam, chest pain was reproducible with palpation on the left side of chest.  Patient denies any recent excessive lifting or trauma.  Patient suspects chest pain is secondary to panic attack. Also reports SOB X 3 to 4 weeks with minimal exertion that resolves with rest.  Notes that she wakes up at night, however unclear if due to SOB or not.  Patient admits to snoring  and excessive daytime fatigue, however has has never had sleep study test before.  Also reports fatigue for 2 months.  Also reports palpitations described as racing heart lasting 1 to 2 minutes at night when laying down and in the a.m. when waking up occurring 3-4 times per week. Denies syncope, presyncope, dizziness, orthopnea, PND, swelling or significant weight changes, acute bleeding, or claudication. Reports compliance with medications. Smoke 1 PPD and denies any etoh or drug use.   ROS: 10 point review of system has been reviewed and considered  negative except ones been listed in the HPI.   Studies Reviewed: .   Echo 03/24/2019:  1. Left ventricular ejection fraction, by visual estimation, is 60 to 65%. The left ventricle has normal function. There is no left ventricular hypertrophy.  2. Left ventricular diastolic parameters are indeterminate.  3. Global right ventricle has normal systolic function.The right ventricular size is normal. No increase in right ventricular wall thickness.  4. Left atrial size was normal.  5. Right atrial size was normal.  6. The mitral valve is normal in structure. No evidence of mitral valve regurgitation. No evidence of mitral stenosis.  7. The tricuspid valve is normal in structure. Tricuspid valve regurgitation is not demonstrated.  8. The aortic valve is tricuspid. Aortic valve regurgitation is not visualized. Mild aortic valve sclerosis without stenosis.  9. The pulmonic valve was normal in structure. Pulmonic valve regurgitation is not visualized. 10. TR signal is inadequate for assessing pulmonary artery systolic pressure. 11. The inferior vena cava is normal in size with <50% respiratory variability, suggesting right atrial pressure of 8 mmHg.  Cardiac cath 03/23/19: Prox RCA lesion is 99% stenosed. Mid RCA lesion is 95% stenosed. Dist RCA lesion is 60% stenosed. 1st RPL lesion is 100% stenosed. 1st Mrg lesion is 99% stenosed. Mid Cx lesion is 70% stenosed. Mid LAD lesion is 90% stenosed. 1st Diag lesion is 90% stenosed. There is mild left ventricular systolic dysfunction. LV end diastolic pressure is normal.  There is no mitral valve regurgitation. The left ventricular ejection fraction is 35-45% by visual estimate.   1. Acute inferior STEMI secondary to severe disease in the mid and distal RCA and acute occlusion of a small posterolateral artery.  2. Severe stenosis mid LAD 3. Severe stenosis moderate caliber diagonal branch 4. Severe stenosis first obtuse marginal branch 5. Severe  stenosis mid Circumflex 6. Mild to moderate LV systolic dysfunction with inferior wall motion abnormality.   Physical Exam:   VS:  BP 112/70 (BP Location: Right Arm, Patient Position: Sitting, Cuff Size: Normal)   Pulse 72   Ht 5' 2 (1.575 m)   Wt 177 lb 3.2 oz (80.4 kg)   SpO2 96%   BMI 32.41 kg/m    Wt Readings from Last 3 Encounters:  04/11/24 177 lb 3.2 oz (80.4 kg)  08/26/23 181 lb (82.1 kg)  02/19/23 175 lb 9.6 oz (79.7 kg)    GEN: Well nourished, well developed in no acute distress while sitting in chair. Accompanied by friend.  NECK: No JVD; No carotid bruits CARDIAC: RRR, no murmurs, rubs, gallops RESPIRATORY:  Clear to auscultation without rales, wheezing or rhonchi  ABDOMEN: Soft, non-tender, non-distended EXTREMITIES:  No edema; No deformity   ASSESSMENT AND PLAN: .   Coronary artery disease involving native coronary artery of native heart without angina pectoris S/P CABG x 3 Hyperlipidemia LDL goal <55 Typical/Atypical Chest Pain  DOE Cardiac cath in 2020 full report above.  s/p 3V CABG (LIMA to LAD, SVG to Diagonal, SVG to PDA) on 03/25/2019 Echo 03/24/19 with LVEF=60-65% without significant valve disease.  Reports mix of typical and atypical features of chest pain. Note CP associated with exertion, however CP was also reproducible with palpation on exam. Also reports SOB with minimal exertion that resolves with rest.  Order ECHO and Cardiac PET.  Avoid coronary CTA with hx of CABG.  Discussed NTG but patient has not use since CP is not sharp and very brief time. Also discussed Imdur but patient prefers to decline and will reassess based on PET results.  Reviewed ED precautions.  Continue ASA 81 mg, Lipitor  80 mg daily, Plavix  75 mg daily, Lopressor  25 mg BID, and omega 3 FA.   Palpitations Reports palpitations described as racing heart lasting 1 to 2 minutes at night when laying down and in the a.m. when waking up occurring 3-4 times per week. Order Zio.   Patient is already on Lopressor  25 mg BID.  Snoring  STOP-Bang Score:  3  { Patient admits to snoring  and excessive daytime fatigue, however has has never had sleep study test before.  Order referral to pulmonary for sleep study.   Fatigue  07/2023 TSH WNL  02/2024 BMP WNL   Order CBC, TSH, BMP, MG ECHO ordered as above.  Sleep study referral as above.   History of CVA (cerebrovascular accident) History of DVT (deep vein thrombosis) After CABG in 2020, patient diagnosed with CVA. 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  Discontinued Plavix  and started on Eliquis . Last OV in 2023 noted patient was off of Eliquis  and back on ASA and Plavix .  Continue on Plavix  and ASA as above.   Tobacco use Previously tried chantix  but was unsuccessful.   Currently smokes 1 PPD  Dicussed cessation but no desire for cessation    Informed Consent   Shared Decision Making/Informed Consent{ The risks [chest pain, shortness of breath, cardiac arrhythmias,  dizziness, blood pressure fluctuations, myocardial infarction, stroke/transient ischemic attack, nausea, vomiting, allergic reaction, radiation exposure, metallic taste sensation and life-threatening complications (estimated to be 1 in 10,000)], benefits (risk stratification, diagnosing coronary artery disease, treatment guidance) and alternatives of a cardiac PET stress test were discussed in detail with Ms. Decock and she agrees to proceed.     Dispo: Follow up in 6-8 weeks with Dr. Verlin.   Signed, Lorette CINDERELLA Kapur, PA-C

## 2024-04-11 ENCOUNTER — Ambulatory Visit: Attending: Physician Assistant

## 2024-04-11 ENCOUNTER — Ambulatory Visit: Attending: Physician Assistant | Admitting: Physician Assistant

## 2024-04-11 ENCOUNTER — Encounter: Payer: Self-pay | Admitting: Physician Assistant

## 2024-04-11 VITALS — BP 112/70 | HR 72 | Ht 62.0 in | Wt 177.2 lb

## 2024-04-11 DIAGNOSIS — R5383 Other fatigue: Secondary | ICD-10-CM

## 2024-04-11 DIAGNOSIS — Z720 Tobacco use: Secondary | ICD-10-CM

## 2024-04-11 DIAGNOSIS — R0609 Other forms of dyspnea: Secondary | ICD-10-CM

## 2024-04-11 DIAGNOSIS — G479 Sleep disorder, unspecified: Secondary | ICD-10-CM

## 2024-04-11 DIAGNOSIS — R002 Palpitations: Secondary | ICD-10-CM

## 2024-04-11 DIAGNOSIS — Z86718 Personal history of other venous thrombosis and embolism: Secondary | ICD-10-CM

## 2024-04-11 DIAGNOSIS — Z951 Presence of aortocoronary bypass graft: Secondary | ICD-10-CM

## 2024-04-11 DIAGNOSIS — R079 Chest pain, unspecified: Secondary | ICD-10-CM

## 2024-04-11 DIAGNOSIS — E785 Hyperlipidemia, unspecified: Secondary | ICD-10-CM

## 2024-04-11 DIAGNOSIS — I2510 Atherosclerotic heart disease of native coronary artery without angina pectoris: Secondary | ICD-10-CM

## 2024-04-11 DIAGNOSIS — Z8673 Personal history of transient ischemic attack (TIA), and cerebral infarction without residual deficits: Secondary | ICD-10-CM

## 2024-04-11 NOTE — Progress Notes (Unsigned)
Enrolled patient for a 14 day Zio XT monitor to be mailed to patients home  [place] to read

## 2024-04-11 NOTE — Patient Instructions (Signed)
 Medication Instructions:  Your physician recommends that you continue on your current medications as directed. Please refer to the Current Medication list given to you today.  *If you need a refill on your cardiac medications before your next appointment, please call your pharmacy*  Lab Work: Today- TSH, CBC, BMET, & Magnesium   If you have labs (blood work) drawn today and your tests are completely normal, you will receive your results only by: MyChart Message (if you have MyChart) OR A paper copy in the mail If you have any lab test that is abnormal or we need to change your treatment, we will call you to review the results.  Testing/Procedures: Your physician has requested that you have an echocardiogram. Echocardiography is a painless test that uses sound waves to create images of your heart. It provides your doctor with information about the size and shape of your heart and how well your heart's chambers and valves are working. This procedure takes approximately one hour. There are no restrictions for this procedure. Please do NOT wear cologne, perfume, aftershave, or lotions (deodorant is allowed). Please arrive 15 minutes prior to your appointment time.  Please note: We ask at that you not bring children with you during ultrasound (echo/ vascular) testing. Due to room size and safety concerns, children are not allowed in the ultrasound rooms during exams. Our front office staff cannot provide observation of children in our lobby area while testing is being conducted. An adult accompanying a patient to their appointment will only be allowed in the ultrasound room at the discretion of the ultrasound technician under special circumstances. We apologize for any inconvenience.  ZIO XT- Long Term Monitor Instructions  Your physician has requested you wear a ZIO patch monitor for 14 days.  This is a single patch monitor. [employer] supplies one patch monitor per enrollment. Additional stickers  are not available. Please do not apply patch if you will be having a Nuclear Stress Test,  Echocardiogram, Cardiac CT, MRI, or Chest Xray during the period you would be wearing the  monitor. The patch cannot be worn during these tests. You cannot remove and re-apply the  ZIO XT patch monitor.  Your ZIO patch monitor will be mailed 3 day USPS to your address on file. It may take 3-5 days  to receive your monitor after you have been enrolled.  Once you have received your monitor, please review the enclosed instructions. Your monitor  has already been registered assigning a specific monitor serial # to you.  Billing and Patient Assistance Program Information  We have supplied [employer] with any of your insurance information on file for billing purposes. [employer] offers a sliding scale Patient Assistance Program for patients that do not have  insurance, or whose insurance does not completely cover the cost of the ZIO monitor.  You must apply for the Patient Assistance Program to qualify for this discounted rate.  To apply, please call [employer] at [phone], select option 4, select option 2, ask to apply for  Patient Assistance Program. Meredeth will ask your household income, and how many people  are in your household. They will quote your out-of-pocket cost based on that information.  [employer] will also be able to set up a 77-month, interest-free payment plan if needed.  Applying the monitor   Shave hair from upper left chest.  Hold abrader disc by orange tab. Rub abrader in 40 strokes over the upper left chest as  indicated in your monitor instructions.  Clean area with  4 enclosed alcohol pads. Let dry.  Apply patch as indicated in monitor instructions. Patch will be placed under collarbone on left  side of chest with arrow pointing upward.  Rub patch adhesive wings for 2 minutes. Remove white label marked 1. Remove the white  label marked 2. Rub patch adhesive wings for 2 additional  minutes.  While looking in a mirror, press and release button in center of patch. A small green light will  flash 3-4 times. This will be your only indicator that the monitor has been turned on.  Do not shower for the first 24 hours. You may shower after the first 24 hours.  Press the button if you feel a symptom. You will hear a small click. Record Date, Time and  Symptom in the Patient Logbook.  When you are ready to remove the patch, follow instructions on the last 2 pages of Patient  Logbook. Stick patch monitor onto the last page of Patient Logbook.  Place Patient Logbook in the blue and white box. Use locking tab on box and tape box closed  securely. The blue and white box has prepaid postage on it. Please place it in the mailbox as  soon as possible. Your physician should have your test results approximately 7 days after the  monitor has been mailed back to [facility].  Call [facility] Customer Care at [phone] if you have questions regarding  your ZIO XT patch monitor. Call them immediately if you see an orange light blinking on your  monitor.  If your monitor falls off in less than 4 days, contact our Monitor department at [phone].  If your monitor becomes loose or falls off after 4 days call [employer] at [phone] for  suggestions on securing your monitor   Follow-Up: At [facility], you and your health needs are our priority.  As part of our continuing mission to provide you with exceptional heart care, our providers are all part of one team.  This team includes your primary Cardiologist (physician) and Advanced Practice Providers or APPs (Physician Assistants and Nurse Practitioners) who all work together to provide you with the care you need, when you need it.  Your next appointment:   6-8 week(s)  Provider:   Lonni Cash, MD    We recommend signing up for the patient portal called MyChart.  Sign up information is provided on this  After Visit Summary.  MyChart is used to connect with patients for Virtual Visits (Telemedicine).  Patients are able to view lab/test results, encounter notes, upcoming appointments, etc.  Non-urgent messages can be sent to your provider as well.   To learn more about what you can do with MyChart, go to [email or web address].   Other Instructions    Please report to Radiology at the [facility] Main Entrance 30 minutes early for your test.  [address] [place], [place] [address]                          How to Prepare for Your Cardiac PET/CT Stress Test:  Nothing to eat or drink, except water, 3 hours prior to arrival time.  NO caffeine/decaffeinated products, or chocolate 12 hours prior to arrival. (Please note decaffeinated beverages (teas/coffees) still contain caffeine).  If you have caffeine within 12 hours prior, the test will need to be rescheduled.  Medication instructions: Do not take erectile dysfunction medications for 72 hours prior to test (sildenafil, tadalafil) Do not  take nitrates (isosorbide mononitrate, Ranexa) the day before or day of test Do not take tamsulosin the day before or morning of test Hold theophylline containing medications for 12 hours. Hold Dipyridamole 48 hours prior to the test.  Diabetic Preparation: If able to eat breakfast prior to 3 hour fasting, you may take all medications, including your insulin . Do not worry if you miss your breakfast dose of insulin  - start at your next meal. If you do not eat prior to 3 hour fast-Hold all diabetes (oral and insulin ) medications. Patients who wear a continuous glucose monitor MUST remove the device prior to scanning.  You may take your remaining medications with water.  NO perfume, cologne or lotion on chest or abdomen area. FEMALES - Please avoid wearing dresses to this appointment.  Total time is 1 to 2 hours; you may want to bring reading material for the waiting time.  IF YOU THINK  YOU MAY BE PREGNANT, OR ARE NURSING PLEASE INFORM THE TECHNOLOGIST.  In preparation for your appointment, medication and supplies will be purchased.  Appointment availability is limited, so if you need to cancel or reschedule, please call the Radiology Department Scheduler at [phone] 24 hours in advance to avoid a cancellation fee of $100.00  What to Expect When you Arrive:  Once you arrive and check in for your appointment, you will be taken to a preparation room within the Radiology Department.  A technologist or Nurse will obtain your medical history, verify that you are correctly prepped for the exam, and explain the procedure.  Afterwards, an IV will be started in your arm and electrodes will be placed on your skin for EKG monitoring during the stress portion of the exam. Then you will be escorted to the PET/CT scanner.  There, staff will get you positioned on the scanner and obtain a blood pressure and EKG.  During the exam, you will continue to be connected to the EKG and blood pressure machines.  A small, safe amount of a radioactive tracer will be injected in your IV to obtain a series of pictures of your heart along with an injection of a stress agent.    After your Exam:  It is recommended that you eat a meal and drink a caffeinated beverage to counter act any effects of the stress agent.  Drink plenty of fluids for the remainder of the day and urinate frequently for the first couple of hours after the exam.  Your doctor will inform you of your test results within 7-10 business days.  For more information and frequently asked questions, please visit our website: [email or web address]  For questions about your test or how to prepare for your test, please call: Cardiac Imaging Nurse Navigators Office: [phone]

## 2024-04-18 ENCOUNTER — Ambulatory Visit (HOSPITAL_COMMUNITY): Attending: Cardiovascular Disease

## 2024-04-19 ENCOUNTER — Ambulatory Visit: Admitting: Cardiovascular Disease

## 2024-04-19 ENCOUNTER — Ambulatory Visit: Payer: Self-pay | Admitting: Physician Assistant

## 2024-04-19 DIAGNOSIS — R5383 Other fatigue: Secondary | ICD-10-CM

## 2024-04-19 DIAGNOSIS — R002 Palpitations: Secondary | ICD-10-CM

## 2024-04-19 LAB — BASIC METABOLIC PANEL WITH GFR
BUN/Creatinine Ratio: 34 — AB (ref 9–23)
BUN: 22 mg/dL (ref 6–24)
CO2: 19 mmol/L — AB (ref 20–29)
Calcium: 9.8 mg/dL (ref 8.7–10.2)
Chloride: 100 mmol/L (ref 96–106)
Creatinine, Ser: 0.65 mg/dL (ref 0.57–1.00)
Glucose: 277 mg/dL — AB (ref 70–99)
Potassium: 4.5 mmol/L (ref 3.5–5.2)
Sodium: 137 mmol/L (ref 134–144)
eGFR: 105 mL/min/1.73 (ref >59)

## 2024-04-19 LAB — CBC
Hematocrit: 49.2 % — ABNORMAL HIGH (ref 34.0–46.6)
Hemoglobin: 16.7 g/dL — ABNORMAL HIGH (ref 11.1–15.9)
MCH: 30.5 pg (ref 26.6–33.0)
MCHC: 33.9 g/dL (ref 31.5–35.7)
MCV: 90 fL (ref 79–97)
Platelets: 373 x10E3/uL (ref 150–450)
RBC: 5.47 x10E6/uL — ABNORMAL HIGH (ref 3.77–5.28)
RDW: 12.3 % (ref 11.7–15.4)
WBC: 9.7 x10E3/uL (ref 3.4–10.8)

## 2024-04-19 LAB — TSH: TSH: 1.15 u[IU]/mL (ref 0.450–4.500)

## 2024-04-19 LAB — MAGNESIUM: Magnesium: 2.1 mg/dL (ref 1.6–2.3)

## 2024-04-19 NOTE — Progress Notes (Deleted)
 No chief complaint on file.  History of Present Illness: 53 yo female with history of CAD s/p 3VCABG, HLD, DM, postoperative CVA, DVT and tobacco abuse here today for cardiac follow up. She was admitted to [facility] in November 2020 with an acute inferior ST elevation MI. Emergent cardiac cath 03/23/19 with multi-vessel CAD. She underwent 3V CABG (LIMA to LAD, SVG to Diagonal, SVG to PDA) on 03/25/19. Echo November 2020 with LVEF=60-65% without significant valve disease. She unfortunately suffered a CVA during the post-operative period and was discharged to inpatient rehab. 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 Plavix  was stopped and she was started on Eliquis . She was discharged home on 04/08/19. She was seen in our office 04/11/24 by Lorette Kapur, PA-C and c/o chest pain and dyspnea.   She is here today for follow up. The patient denies any chest pain, dyspnea, palpitations, lower extremity edema, orthopnea, PND, dizziness, near syncope or syncope.    Primary Care Physician: Orpha Yancey LABOR, MD  Past Medical History:  Diagnosis Date   Anxiety    Coronary artery disease    Diabetes (HCC)    Hyperlipidemia    Myocardial infarction (HCC)    Tobacco abuse     Past Surgical History:  Procedure Laterality Date   CARDIAC CATHETERIZATION     CORONARY ARTERY BYPASS GRAFT N/A 03/25/2019   Procedure: CORONARY ARTERY BYPASS GRAFTING (CABG) X THREE, ON PUMP, USING LEFT INTERNAL MAMMARY ARTERY AND LEFT GREATER SAPHENOUS VEIN HARVESTED ENDOSCOPICALLY;  Surgeon: Fleeta Hanford Coy, MD;  Location: [facility] OR;  Service: Open Heart Surgery;  Laterality: N/A;   LEFT HEART CATH AND CORONARY ANGIOGRAPHY N/A 03/23/2019   Procedure: LEFT HEART CATH AND CORONARY ANGIOGRAPHY;  Surgeon: Verlin Lonni BIRCH, MD;  Location: [facility] INVASIVE CV LAB;  Service: Cardiovascular;  Laterality: N/A;   None     TEE WITHOUT CARDIOVERSION N/A 03/25/2019   Procedure:  TRANSESOPHAGEAL ECHOCARDIOGRAM (TEE);  Surgeon: Fleeta Hanford, Coy, MD;  Location: [facility] OR;  Service: Open Heart Surgery;  Laterality: N/A;    Current Outpatient Medications  Medication Sig Dispense Refill   acetaminophen  (TYLENOL ) 325 MG tablet Take 1-2 tablets (325-650 mg total) by mouth every 4 (four) hours as needed for mild pain.     aspirin  EC 81 MG EC tablet Take 1 tablet (81 mg total) by mouth daily.     atorvastatin  (LIPITOR ) 80 MG tablet Take 1 tablet (80 mg total) by mouth daily at 6 PM. 30 tablet 1   BIOTIN PO Take by mouth.     buPROPion  (WELLBUTRIN  SR) 150 MG 12 hr tablet Take 150 mg by mouth 2 (two) times daily.     Cholecalciferol (QC VITAMIN D3) 125 MCG (5000 UT) TABS Take 1 tablet (5,000 Units total) by mouth daily. 30 tablet 1   citalopram (CELEXA) 40 MG tablet Take 40 mg by mouth daily.     clopidogrel  (PLAVIX ) 75 MG tablet Take 1 tablet (75 mg total) by mouth daily. 90 tablet 3   Cyanocobalamin (VITAMIN B-12 PO) Take by mouth.     fluconazole  (DIFLUCAN ) 150 MG tablet Take 1 now and 1 in 3 days (Patient taking differently: Take 150 mg by mouth as needed. Take 1 now and 1 in 3 days) 2 tablet 1   gabapentin  (NEURONTIN ) 600 MG tablet Take 1 tablet (600 mg total) by mouth 3 (three) times daily. 90 tablet 1   glipiZIDE  (GLUCOTROL )  10 MG tablet Take 1 tablet (10 mg total) by mouth 2 (two) times daily before a meal. 60 tablet 1   JARDIANCE 10 MG TABS tablet Take 10 mg by mouth every morning.     metoprolol  tartrate (LOPRESSOR ) 25 MG tablet Take 1 tablet (25 mg total) by mouth 2 (two) times daily. 180 tablet 3   metroNIDAZOLE  (METROGEL ) 0.75 % vaginal gel Place 1 Applicatorful vaginally at bedtime. 70 g 0   nitrofurantoin , macrocrystal-monohydrate, (MACROBID ) 100 MG capsule Take 1 capsule by mouth twice daily 14 capsule 0   Omega-3 Fatty Acids (FISH OIL PO) Take by mouth.     OZEMPIC, 1 MG/DOSE, 4 MG/3ML SOPN Inject into the skin.     VITAMIN E PO Take by mouth.     No current  facility-administered medications for this visit.    No Known Allergies  Social History   Socioeconomic History   Marital status: Unknown    Spouse name: Not on file   Number of children: Not on file   Years of education: Not on file   Highest education level: Not on file  Occupational History   Not on file  Tobacco Use   Smoking status: Every Day    Types: Cigarettes   Smokeless tobacco: Never  Vaping Use   Vaping status: Never Used  Substance and Sexual Activity   Alcohol use: Not Currently   Drug use: Not Currently   Sexual activity: Yes    Birth control/protection: Post-menopausal  Other Topics Concern   Not on file  Social History Narrative   Not on file   Social Drivers of Health   Financial Resource Strain: Low Risk  (01/06/2023)   Overall Financial Resource Strain (CARDIA)    Difficulty of Paying Living Expenses: Not very hard  Food Insecurity: No Food Insecurity (01/06/2023)   Hunger Vital Sign    Worried About Running Out of Food in the Last Year: Never true    Ran Out of Food in the Last Year: Never true  Transportation Needs: No Transportation Needs (01/06/2023)   PRAPARE - Administrator, Civil Service (Medical): No    Lack of Transportation (Non-Medical): No  Physical Activity: Sufficiently Active (01/06/2023)   Exercise Vital Sign    Days of Exercise per Week: 3 days    Minutes of Exercise per Session: 60 min  Stress: Stress Concern Present (07/25/2021)   [employer] of Occupational Health - Occupational Stress Questionnaire    Feeling of Stress : Very much  Social Connections: Unknown (10/01/2021)   Received from [facility]   Social Network    Social Network: Not on file  Recent Concern: Social Connections - Socially Isolated (07/25/2021)   Social Connection and Isolation Panel    Frequency of Communication with Friends and Family: More than three times a week    Frequency of Social Gatherings with Friends and Family: Three times a  week    Attends Religious Services: Never    Active Member of Clubs or Organizations: No    Attends Banker Meetings: Never    Marital Status: Divorced  Catering Manager Violence: Unknown (08/23/2021)   Received from [facility]   HITS    Physically Hurt: Not on file    Insult or Talk Down To: Not on file    Threaten Physical Harm: Not on file    Scream or Curse: Not on file    Family History  Problem Relation Age of Onset  Heart attack Father     Review of Systems:  As stated in the HPI and otherwise negative.   There were no vitals taken for this visit.  Physical Examination: General: Well developed, well nourished, NAD  HEENT: OP clear, mucus membranes moist  SKIN: warm, dry. No rashes. Neuro: No focal deficits  Musculoskeletal: Muscle strength 5/5 all ext  Psychiatric: Mood and affect normal  Neck: No JVD, no carotid bruits, no thyromegaly, no lymphadenopathy.  Lungs:Clear bilaterally, no wheezes, rhonci, crackles Cardiovascular: Regular rate and rhythm. No murmurs, gallops or rubs. Abdomen:Soft. Bowel sounds present. Non-tender.  Extremities: No lower extremity edema. Pulses are 2 + in the bilateral DP/PT.  EKG:  EKG is ordered today. The ekg ordered today demonstrates sinus, poor R wave progression unchanged  Echo 05/23/18:  1. Left ventricular ejection fraction, by visual estimation, is 60 to 65%. The left ventricle has normal function. There is no left ventricular hypertrophy.  2. Left ventricular diastolic parameters are indeterminate.  3. Global right ventricle has normal systolic function.The right ventricular size is normal. No increase in right ventricular wall thickness.  4. Left atrial size was normal.  5. Right atrial size was normal.  6. The mitral valve is normal in structure. No evidence of mitral valve regurgitation. No evidence of mitral stenosis.  7. The tricuspid valve is normal in structure. Tricuspid valve regurgitation is not  demonstrated.  8. The aortic valve is tricuspid. Aortic valve regurgitation is not visualized. Mild aortic valve sclerosis without stenosis.  9. The pulmonic valve was normal in structure. Pulmonic valve regurgitation is not visualized. 10. TR signal is inadequate for assessing pulmonary artery systolic pressure. 11. The inferior vena cava is normal in size with <50% respiratory variability, suggesting right atrial pressure of 8 mmHg.  Cardiac cath 03/23/19: Prox RCA lesion is 99% stenosed. Mid RCA lesion is 95% stenosed. Dist RCA lesion is 60% stenosed. 1st RPL lesion is 100% stenosed. 1st Mrg lesion is 99% stenosed. Mid Cx lesion is 70% stenosed. Mid LAD lesion is 90% stenosed. 1st Diag lesion is 90% stenosed. There is mild left ventricular systolic dysfunction. LV end diastolic pressure is normal. There is no mitral valve regurgitation. The left ventricular ejection fraction is 35-45% by visual estimate.   1. Acute inferior STEMI secondary to severe disease in the mid and distal RCA and acute occlusion of a small posterolateral artery.  2. Severe stenosis mid LAD 3. Severe stenosis moderate caliber diagonal branch 4. Severe stenosis first obtuse marginal branch 5. Severe stenosis mid Circumflex 6. Mild to moderate LV systolic dysfunction with inferior wall motion abnormality.   Recent Labs: 04/18/2024: BUN 22; Creatinine, Ser 0.65; Hemoglobin 16.7; Magnesium  2.1; Platelets 373; Potassium 4.5; Sodium 137; TSH 1.150   Lipid Panel    Component Value Date/Time   CHOL 112 03/28/2019 0408   TRIG 114 03/28/2019 0408   HDL 21 (L) 03/28/2019 0408   CHOLHDL 5.3 03/28/2019 0408   VLDL 23 03/28/2019 0408   LDLCALC 68 03/28/2019 0408     Wt Readings from Last 3 Encounters:  04/11/24 177 lb 3.2 oz (80.4 kg)  08/26/23 181 lb (82.1 kg)  02/19/23 175 lb 9.6 oz (79.7 kg)    Assessment and Plan:   1. CAD s/p CABG with angina: *** plan from here ***  No chest pain. LVEF normal in 2020.  Continue DAPT with ASA and Plavix  given her CAD and stroke. Continue statin and beta blocker. LDL 110 in April 2023. She was not  taking Lipitor  every day when this was done. She has been back on it and has repeat labs for later this week in primary care. BP is well controlled.    2. Tobacco abuse: I have asked her to stop smoking She will try Chantix .   Current medicines are reviewed at length with the patient today.  The patient does not have concerns regarding medicines.  The following changes have been made:  no change  Labs/ tests ordered today include:   No orders of the defined types were placed in this encounter.  Disposition:   F/U with me in 12  months   Signed, Lonni Cash, MD 04/19/2024 8:13 AM    [facility] [address] [place], [place], [place]  [address] Phone: [phone]; Fax: [phone]

## 2024-04-26 ENCOUNTER — Encounter: Payer: Self-pay | Admitting: Pulmonary Disease

## 2024-04-26 ENCOUNTER — Ambulatory Visit: Admitting: Pulmonary Disease

## 2024-04-26 ENCOUNTER — Ambulatory Visit (HOSPITAL_COMMUNITY): Admission: RE | Admit: 2024-04-26 | Discharge: 2024-04-26 | Attending: Physician Assistant

## 2024-04-26 VITALS — BP 100/50 | HR 83 | Ht 62.0 in | Wt 178.6 lb

## 2024-04-26 DIAGNOSIS — R0609 Other forms of dyspnea: Secondary | ICD-10-CM

## 2024-04-26 DIAGNOSIS — Z951 Presence of aortocoronary bypass graft: Secondary | ICD-10-CM | POA: Diagnosis not present

## 2024-04-26 DIAGNOSIS — E785 Hyperlipidemia, unspecified: Secondary | ICD-10-CM

## 2024-04-26 DIAGNOSIS — F17210 Nicotine dependence, cigarettes, uncomplicated: Secondary | ICD-10-CM | POA: Diagnosis not present

## 2024-04-26 DIAGNOSIS — R002 Palpitations: Secondary | ICD-10-CM

## 2024-04-26 DIAGNOSIS — I252 Old myocardial infarction: Secondary | ICD-10-CM

## 2024-04-26 DIAGNOSIS — R0683 Snoring: Secondary | ICD-10-CM | POA: Diagnosis not present

## 2024-04-26 DIAGNOSIS — Z8673 Personal history of transient ischemic attack (TIA), and cerebral infarction without residual deficits: Secondary | ICD-10-CM | POA: Diagnosis not present

## 2024-04-26 DIAGNOSIS — R5383 Other fatigue: Secondary | ICD-10-CM | POA: Diagnosis not present

## 2024-04-26 DIAGNOSIS — R072 Precordial pain: Secondary | ICD-10-CM | POA: Diagnosis not present

## 2024-04-26 DIAGNOSIS — I2510 Atherosclerotic heart disease of native coronary artery without angina pectoris: Secondary | ICD-10-CM

## 2024-04-26 LAB — ECHOCARDIOGRAM COMPLETE
AR max vel: 1.85 cm2
AV Area VTI: 1.96 cm2
AV Area mean vel: 1.98 cm2
AV Mean grad: 4 mmHg
AV Peak grad: 8 mmHg
Ao pk vel: 1.41 m/s
Area-P 1/2: 2.63 cm2
S' Lateral: 3.36 cm

## 2024-04-26 MED ORDER — NICOTINE POLACRILEX 4 MG MT LOZG: 4.0000 mg | LOZENGE | OROMUCOSAL | 0 refills | Status: AC | PRN

## 2024-04-26 MED ORDER — NICOTINE 21 MG/24HR TD PT24: 21.0000 mg | MEDICATED_PATCH | Freq: Every day | TRANSDERMAL | 5 refills | Status: AC

## 2024-04-26 NOTE — Patient Instructions (Signed)
  I have prescribed Nicotine  patches.  The patient is to apply the patch to a non-hairy skin site and rotate the site daily. Will begin with starting dose of Because you were smoking > 10 cigarettes per day, I have prescribed a 21 mg patch for you to apply daily for 6 weeks. Then we will plan to decrease to a 14 mg patch daily for 2 weeks, followed by a 7 mg patch daily for 2 more weeks.  We will discuss again in detail at your follow-up visit! and I have prescribed Nicotine  Lozenges with the following instructions: I have sent in a 4 mg lozenge for you to take as follows:  Weeks 1 to 6: 1 lozenge every 1 to 2 hours (maximum: 5 lozenges every 6 hours; 20 lozenges/day); to increase chances of quitting, use at least 9 lozenges/day during the first 6 weeks. Weeks 7 to 9: 1 lozenge every 2 to 4 hours (maximum: 5 lozenges every 6 hours; 20 lozenges/day). Weeks 10 to 12: 1 lozenge every 4 to 8 hours (maximum: 5 lozenges every 6 hours; 20 lozenges/day).   I strongly recommend you also call 1-800-QUIT-NOW (681-528-1508) to register with [facility] [place] -- this service offers free counseling and free samples of nicotine  replacement supplies).  If you prefer, you can also go to www.smokefree.gov to access free phone-, text-, and web-based smoking cessation programs.  ----------   You will receive a call to schedule your Sleep Study.  ----------   There is no need to schedule a follow up visit today. Our office will call you with the results and next steps, and you will schedule your next visit at that time.  ----------

## 2024-04-26 NOTE — Progress Notes (Signed)
 Synopsis: Referred in 03/2024 for sleep disturbance and fatigue by Sheron Lorette GRADE, PA-C  Subjective:   PATIENT ID: Joan Webb GENDER: female DOB: 1970/09/10, MRN: [id number]  Chief Complaint  Patient presents with   Consult    Snores loudly, wakes self up snoring, wakes up gasping, has been told she stops breathing.  Stays tired during the day.  Up and down all night down.   Never has a sleep study.    HPI  Joan Webb is a 53 y.o. female with extensive cardiac history (see below), prior CVA and L subclavian DVT in post-operative setting (2020), T2DM, and nicotine  dependence presents today for evaluation of snoring and fatigue.  Fatigue evaluation to date: - Normal TSH - Most labs reveal anemia except for the most recent CBC which shows Hgb 16.1. Suspect spurious result. There is a plan to repeat this lab.  Sleep habits: - Snoring: Yes - Bedtime: Highly variable - Sleep latency: 5-10 mins - Number of awakenings per night: 3-4 x / night, usually to urinate - Wake time: 8-9 am - Waking headaches: No - Sleep aids: None - Stimulant use: None - H/o MVAs / drowsy driving: Denies - If I sit too long I'm going to fall asleep. Unable to prevent herself from falling asleep if watching TV or sitting in a movie cinema. Denies sleepiness as long as she remains active. ESS 16.  Comorbidities: - Hypertension: No - Diabetes: Yes - Obesity: Yes  Other: - GLP-1 use: Yes  Notable medications: - Gabapentin  600 mg TID - Citalopram 40 mg daily - Metoprolol  tartrate 25 mg BID - Ozempic  Smoking cessation: - Currently smoking 1 ppd. First cigarette < 30 mins after waking? Yes - Tried Chantix  previously => unsuccessful quit attempt. Currently on Wellbutrin  SR 150 mg BID. - She states she wants to quit smoking. Motivating factors include the smell of smoke on her clothing as well as the effect on her health. - Tried nicotine  patches without any gum/lozenges in the past. Has not  tried combination NRT. - Patient elected for a quit date of 05/06/2024.  Cardiac History: - Acute inferior STEMI due to severe disease in mid- and distal RCA + acute occlusion of a small posterolateral artery per review of cardiology note dated 04/11/2024. - S/p 3V CABG (LIMA-LAD, SVG-Diag, SVG-PDA) in 03/2019, with post-operative course c/b CVA. - Echocardiogram from earlier today not yet read but per my own interpretation shows a grossly preserved LVEF. - Relevant cardiac medications: ASA/Plavix , Lopressor  25 mg BID, Lipitor  80 mg daily.  Pulm Questionnaires:      No data to display           Past Medical History:  Diagnosis Date   Anxiety    Coronary artery disease    Diabetes (HCC)    Hyperlipidemia    Myocardial infarction (HCC)    Tobacco abuse      Family History  Problem Relation Age of Onset   Heart attack Father      Past Surgical History:  Procedure Laterality Date   CARDIAC CATHETERIZATION     CORONARY ARTERY BYPASS GRAFT N/A 03/25/2019   Procedure: CORONARY ARTERY BYPASS GRAFTING (CABG) X THREE, ON PUMP, USING LEFT INTERNAL MAMMARY ARTERY AND LEFT GREATER SAPHENOUS VEIN HARVESTED ENDOSCOPICALLY;  Surgeon: Fleeta Hanford Coy, MD;  Location: [facility] OR;  Service: Open Heart Surgery;  Laterality: N/A;   LEFT HEART CATH AND CORONARY ANGIOGRAPHY N/A 03/23/2019   Procedure: LEFT HEART CATH AND CORONARY ANGIOGRAPHY;  Surgeon:  Verlin Lonni BIRCH, MD;  Location: [facility] INVASIVE CV LAB;  Service: Cardiovascular;  Laterality: N/A;   None     TEE WITHOUT CARDIOVERSION N/A 03/25/2019   Procedure: TRANSESOPHAGEAL ECHOCARDIOGRAM (TEE);  Surgeon: Fleeta Ochoa, Maude, MD;  Location: [facility] OR;  Service: Open Heart Surgery;  Laterality: N/A;    Social History   Socioeconomic History   Marital status: Unknown    Spouse name: Not on file   Number of children: Not on file   Years of education: Not on file   Highest education level: Not on file  Occupational History   Not on file   Tobacco Use   Smoking status: Every Day    Types: Cigarettes   Smokeless tobacco: Never   Tobacco comments:    1/2-1 ppd.  Trying to quit.  Has tried patches.  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  Vaping Use   Vaping status: Never Used  Substance and Sexual Activity   Alcohol use: Not Currently   Drug use: Not Currently   Sexual activity: Yes    Birth control/protection: Post-menopausal  Other Topics Concern   Not on file  Social History Narrative   Not on file   Social Drivers of Health   Financial Resource Strain: Low Risk  (01/06/2023)   Overall Financial Resource Strain (CARDIA)    Difficulty of Paying Living Expenses: Not very hard  Food Insecurity: No Food Insecurity (01/06/2023)   Hunger Vital Sign    Worried About Running Out of Food in the Last Year: Never true    Ran Out of Food in the Last Year: Never true  Transportation Needs: No Transportation Needs (01/06/2023)   PRAPARE - Administrator, Civil Service (Medical): No    Lack of Transportation (Non-Medical): No  Physical Activity: Sufficiently Active (01/06/2023)   Exercise Vital Sign    Days of Exercise per Week: 3 days    Minutes of Exercise per Session: 60 min  Stress: Stress Concern Present (07/25/2021)   [employer] of Occupational Health - Occupational Stress Questionnaire    Feeling of Stress : Very much  Social Connections: Unknown (10/01/2021)   Received from [facility]   Social Network    Social Network: Not on file  Recent Concern: Social Connections - Socially Isolated (07/25/2021)   Social Connection and Isolation Panel    Frequency of Communication with Friends and Family: More than three times a week    Frequency of Social Gatherings with Friends and Family: Three times a week    Attends Religious Services: Never    Active Member of Clubs or Organizations: No    Attends Banker Meetings: Never    Marital Status: Divorced  Catering Manager Violence: Unknown (08/23/2021)   Received from  [facility]   HITS    Physically Hurt: Not on file    Insult or Talk Down To: Not on file    Threaten Physical Harm: Not on file    Scream or Curse: Not on file     No Known Allergies   Outpatient Medications Prior to Visit  Medication Sig Dispense Refill   acetaminophen  (TYLENOL ) 325 MG tablet Take 1-2 tablets (325-650 mg total) by mouth every 4 (four) hours as needed for mild pain.     aspirin  EC 81 MG EC tablet Take 1 tablet (81 mg total) by mouth daily.     atorvastatin  (LIPITOR ) 80 MG tablet Take 1 tablet (80 mg total) by mouth daily at 6 PM. 30 tablet  1   BIOTIN PO Take by mouth.     buPROPion  (WELLBUTRIN  SR) 150 MG 12 hr tablet Take 150 mg by mouth 2 (two) times daily.     Cholecalciferol (QC VITAMIN D3) 125 MCG (5000 UT) TABS Take 1 tablet (5,000 Units total) by mouth daily. 30 tablet 1   citalopram (CELEXA) 40 MG tablet Take 40 mg by mouth daily.     clopidogrel  (PLAVIX ) 75 MG tablet Take 1 tablet (75 mg total) by mouth daily. 90 tablet 3   Cyanocobalamin (VITAMIN B-12 PO) Take by mouth.     gabapentin  (NEURONTIN ) 600 MG tablet Take 1 tablet (600 mg total) by mouth 3 (three) times daily. 90 tablet 1   glipiZIDE  (GLUCOTROL ) 10 MG tablet Take 1 tablet (10 mg total) by mouth 2 (two) times daily before a meal. 60 tablet 1   JARDIANCE 10 MG TABS tablet Take 10 mg by mouth every morning.     metoprolol  tartrate (LOPRESSOR ) 25 MG tablet Take 1 tablet (25 mg total) by mouth 2 (two) times daily. 180 tablet 3   metroNIDAZOLE  (METROGEL ) 0.75 % vaginal gel Place 1 Applicatorful vaginally at bedtime. 70 g 0   nitrofurantoin , macrocrystal-monohydrate, (MACROBID ) 100 MG capsule Take 1 capsule by mouth twice daily 14 capsule 0   Omega-3 Fatty Acids (FISH OIL PO) Take by mouth.     OZEMPIC, 1 MG/DOSE, 4 MG/3ML SOPN Inject into the skin.     VITAMIN E PO Take by mouth.     fluconazole  (DIFLUCAN ) 150 MG tablet Take 1 now and 1 in 3 days (Patient not taking: Reported on 04/26/2024) 2 tablet 1    No facility-administered medications prior to visit.    ROS   Objective:  Physical Exam Vitals reviewed.  Constitutional:      Appearance: Normal appearance. She is obese.     Comments: Sleepy in appearance.  HENT:     Head: Normocephalic and atraumatic.     Mouth/Throat:     Mouth: Mucous membranes are moist.     Pharynx: Oropharynx is clear. No oropharyngeal exudate or posterior oropharyngeal erythema.     Comments: Mallampatti I. No tongue scalloping. Eyes:     General: No scleral icterus.       Right eye: No discharge.        Left eye: No discharge.     Conjunctiva/sclera: Conjunctivae normal.  Pulmonary:     Effort: Pulmonary effort is normal.  Neurological:     Mental Status: She is alert and oriented to person, place, and time. Mental status is at baseline.  Psychiatric:        Behavior: Behavior normal.        Thought Content: Thought content normal.        Judgment: Judgment normal.      Vitals:   04/26/24 1524  BP: (!) 100/50  Pulse: 83  SpO2: 97%  Weight: 178 lb 9.6 oz (81 kg)  Height: 5' 2 (1.575 m)   97% on RA BMI Readings from Last 3 Encounters:  04/26/24 32.67 kg/m  04/11/24 32.41 kg/m  08/26/23 33.11 kg/m   Wt Readings from Last 3 Encounters:  04/26/24 178 lb 9.6 oz (81 kg)  04/11/24 177 lb 3.2 oz (80.4 kg)  08/26/23 181 lb (82.1 kg)     CBC    Component Value Date/Time   WBC 9.7 04/18/2024 1628   WBC 9.2 04/08/2019 1014   RBC 5.47 (H) 04/18/2024 1628   RBC 3.65 (L) 04/08/2019  1014   HGB 16.7 (H) 04/18/2024 1628   HCT 49.2 (H) 04/18/2024 1628   PLT 373 04/18/2024 1628   MCV 90 04/18/2024 1628   MCH 30.5 04/18/2024 1628   MCH 27.9 04/08/2019 1014   MCHC 33.9 04/18/2024 1628   MCHC 32.2 04/08/2019 1014   RDW 12.3 04/18/2024 1628   LYMPHSABS 1.6 04/08/2019 1014   MONOABS 0.5 04/08/2019 1014   EOSABS 0.1 04/08/2019 1014   BASOSABS 0.1 04/08/2019 1014    TSH 1.150 (04/18/2024) - normal   Chest Imaging: CXR  (06/15/2019): No acute intrathoracic disease. Median sternotomy wires present.  Pulmonary Functions Testing Results:    Latest Ref Rng & Units 03/24/2019    2:36 PM  PFT Results  FVC-Pre L 2.61   FVC-Predicted Pre % 78   Pre FEV1/FVC % % 75   FEV1-Pre L 1.96   FEV1-Predicted Pre % 74    Note: 03/24/2019 PFTs interpreted as obstructive lung disease affecting distal airways, in part due to FEF25-75% / FVC of ~62%.  FeNO: None  Pathology: None  Echocardiogram: 04/26/24 TTE: Not yet interpreted though my own interpretation is that the LVEF is preserved.  Heart Catheterization: Coronary angiography in 2020 available in Chart Review tab. Patient now s/p CABG as described above.  Sleep Studies: None.    Assessment & Plan:     ICD-10-CM   1. Snoring  R06.83 Home sleep test    2. Fatigue, unspecified type  R53.83 Home sleep test    3. Cigarette nicotine  dependence, uncomplicated  F17.210 nicotine  (NICODERM CQ  - DOSED IN MG/24 HOURS) 21 mg/24hr patch    nicotine  polacrilex (COMMIT) 4 MG lozenge    4. Hx of CABG  Z95.1     5. History of ST elevation myocardial infarction (STEMI)  I25.2     6. History of stroke  Z86.73       Discussion:  Patient is high risk for OSA. Ordered HST to evaluate further. If HST confirms this diagnosis, I have recommended an in-lab CPAP Titration (rather than empiric CPAP prescription / AutoCPAP) given her significant cardiac history and history of stroke which increases her risk of TE-CSA and PAP intolerance.  Both gabapentin  and citalopram have sedating properties and may be contributing factors to her fatigue and sleepiness, however I recommend first evaluating and treating her probable OSA prior to implicating / modifying these medications.  At follow up, consider repeat PFTs (last PFTs are from 2020), better characterize her COPD-related symptom burden and initiate inhaler regimen as needed.   Smoking/Tobacco Cessation Counseling Joan Webb is a current user of tobacco or nicotine  products.  # Cigarettes / day: > 10 per day. First cigarette within 30 mins of waking: Yes.  She is ready to quit at this time. Counseling provided today addressed the risks of continued use and the benefits of cessation. Discussed tobacco/nicotine  use history, readiness to quit, and evidence-based treatment options including behavioral strategies, support resources, and pharmacologic therapies. Provided encouragement and educational materials on steps and resources to quit smoking. Patient questions were addressed, and follow-up recommended for continued support. Will use a strategy of combination NRT while continuing her existing Wellbutrin  SR. Prescriptions sent to her pharmacy today (21 mg patch + 4 mg lozenges). Quit date of 05/06/2024 set. Total time spent on counseling: 11 minutes.  Time spent on day of this encounter (includes time spent face-to-face with the patient as well as time spent the same day as the encounter reviewing existing data and notes,  and/or documenting my findings and the plan of care): 62 minutes. This excludes the documented time above spent on smoking cessation.   Current Outpatient Medications:    acetaminophen  (TYLENOL ) 325 MG tablet, Take 1-2 tablets (325-650 mg total) by mouth every 4 (four) hours as needed for mild pain., Disp:  , Rfl:    aspirin  EC 81 MG EC tablet, Take 1 tablet (81 mg total) by mouth daily., Disp:  , Rfl:    atorvastatin  (LIPITOR ) 80 MG tablet, Take 1 tablet (80 mg total) by mouth daily at 6 PM., Disp: 30 tablet, Rfl: 1   BIOTIN PO, Take by mouth., Disp: , Rfl:    buPROPion  (WELLBUTRIN  SR) 150 MG 12 hr tablet, Take 150 mg by mouth 2 (two) times daily., Disp: , Rfl:    Cholecalciferol (QC VITAMIN D3) 125 MCG (5000 UT) TABS, Take 1 tablet (5,000 Units total) by mouth daily., Disp: 30 tablet, Rfl: 1   citalopram (CELEXA) 40 MG tablet, Take 40 mg by mouth daily., Disp: , Rfl:    clopidogrel  (PLAVIX ) 75  MG tablet, Take 1 tablet (75 mg total) by mouth daily., Disp: 90 tablet, Rfl: 3   Cyanocobalamin (VITAMIN B-12 PO), Take by mouth., Disp: , Rfl:    gabapentin  (NEURONTIN ) 600 MG tablet, Take 1 tablet (600 mg total) by mouth 3 (three) times daily., Disp: 90 tablet, Rfl: 1   glipiZIDE  (GLUCOTROL ) 10 MG tablet, Take 1 tablet (10 mg total) by mouth 2 (two) times daily before a meal., Disp: 60 tablet, Rfl: 1   JARDIANCE 10 MG TABS tablet, Take 10 mg by mouth every morning., Disp: , Rfl:    metoprolol  tartrate (LOPRESSOR ) 25 MG tablet, Take 1 tablet (25 mg total) by mouth 2 (two) times daily., Disp: 180 tablet, Rfl: 3   metroNIDAZOLE  (METROGEL ) 0.75 % vaginal gel, Place 1 Applicatorful vaginally at bedtime., Disp: 70 g, Rfl: 0   nicotine  (NICODERM CQ  - DOSED IN MG/24 HOURS) 21 mg/24hr patch, Place 1 patch (21 mg total) onto the skin daily., Disp: 30 patch, Rfl: 5   nicotine  polacrilex (COMMIT) 4 MG lozenge, Take 1 lozenge (4 mg total) by mouth as needed for smoking cessation., Disp: 100 tablet, Rfl: 0   nitrofurantoin , macrocrystal-monohydrate, (MACROBID ) 100 MG capsule, Take 1 capsule by mouth twice daily, Disp: 14 capsule, Rfl: 0   Omega-3 Fatty Acids (FISH OIL PO), Take by mouth., Disp: , Rfl:    OZEMPIC, 1 MG/DOSE, 4 MG/3ML SOPN, Inject into the skin., Disp: , Rfl:    VITAMIN E PO, Take by mouth., Disp: , Rfl:    fluconazole  (DIFLUCAN ) 150 MG tablet, Take 1 now and 1 in 3 days (Patient not taking: Reported on 04/26/2024), Disp: 2 tablet, Rfl: 1  Lamar Dales, MD Pulmonary, Critical Care & Sleep Medicine [facility] 04/26/24 4:26 PM

## 2024-06-06 ENCOUNTER — Encounter (HOSPITAL_COMMUNITY): Payer: Self-pay

## 2024-06-08 ENCOUNTER — Ambulatory Visit (HOSPITAL_COMMUNITY)
Admission: RE | Admit: 2024-06-08 | Discharge: 2024-06-08 | Disposition: A | Discharge status: Home / Self Care | Source: Ambulatory Visit | Attending: Physician Assistant | Admitting: Physician Assistant

## 2024-06-08 DIAGNOSIS — I2510 Atherosclerotic heart disease of native coronary artery without angina pectoris: Secondary | ICD-10-CM | POA: Diagnosis not present

## 2024-06-08 DIAGNOSIS — Z951 Presence of aortocoronary bypass graft: Secondary | ICD-10-CM | POA: Diagnosis not present

## 2024-06-08 DIAGNOSIS — R079 Chest pain, unspecified: Secondary | ICD-10-CM | POA: Diagnosis not present

## 2024-06-08 DIAGNOSIS — R0609 Other forms of dyspnea: Secondary | ICD-10-CM | POA: Insufficient documentation

## 2024-06-08 LAB — NM PET CT CARDIAC PERFUSION MULTI W/ABSOLUTE BLOODFLOW
LV dias vol: 99 mL (ref 46–106)
LV sys vol: 44 mL
MBFR: 1.62
Nuc Rest EF: 56 %
Nuc Stress EF: 50 %
Rest MBF: 0.79 ml/g/min
Rest Nuclear Isotope Dose: 20.8 mCi
ST Depression (mm): 0 mm
Stress MBF: 1.28 ml/g/min
Stress Nuclear Isotope Dose: 20.8 mCi

## 2024-06-08 MED ORDER — REGADENOSON 0.4 MG/5ML IV SOLN
0.4000 mg | Freq: Once | INTRAVENOUS | Status: AC
  Administered 2024-06-08: 0.4 mg via INTRAVENOUS

## 2024-06-08 MED ORDER — RUBIDIUM RB82 GENERATOR (RUBYFILL)
20.8000 | PACK | Freq: Once | INTRAVENOUS | Status: AC
  Administered 2024-06-08: 20.8 via INTRAVENOUS

## 2024-06-08 MED ORDER — REGADENOSON 0.4 MG/5ML IV SOLN
INTRAVENOUS | Status: AC
  Filled 2024-06-08: qty 5

## 2024-06-08 MED ORDER — RUBIDIUM RB82 GENERATOR (RUBYFILL)
21.1000 | PACK | Freq: Once | INTRAVENOUS | Status: AC
  Administered 2024-06-08: 21.1 via INTRAVENOUS

## 2024-06-16 ENCOUNTER — Other Ambulatory Visit: Payer: Self-pay | Admitting: Adult Health

## 2024-06-16 ENCOUNTER — Ambulatory Visit

## 2024-06-16 ENCOUNTER — Other Ambulatory Visit (HOSPITAL_COMMUNITY)
Admission: RE | Admit: 2024-06-16 | Discharge: 2024-06-16 | Disposition: A | Source: Ambulatory Visit | Attending: Obstetrics & Gynecology | Admitting: Obstetrics & Gynecology

## 2024-06-16 ENCOUNTER — Ambulatory Visit (INDEPENDENT_AMBULATORY_CARE_PROVIDER_SITE_OTHER): Admitting: *Deleted

## 2024-06-16 DIAGNOSIS — N898 Other specified noninflammatory disorders of vagina: Secondary | ICD-10-CM | POA: Diagnosis present

## 2024-06-16 DIAGNOSIS — R8290 Unspecified abnormal findings in urine: Secondary | ICD-10-CM | POA: Diagnosis not present

## 2024-06-16 DIAGNOSIS — Z113 Encounter for screening for infections with a predominantly sexual mode of transmission: Secondary | ICD-10-CM | POA: Diagnosis present

## 2024-06-16 DIAGNOSIS — R310 Gross hematuria: Secondary | ICD-10-CM

## 2024-06-16 DIAGNOSIS — N9489 Other specified conditions associated with female genital organs and menstrual cycle: Secondary | ICD-10-CM | POA: Insufficient documentation

## 2024-06-16 DIAGNOSIS — R319 Hematuria, unspecified: Secondary | ICD-10-CM

## 2024-06-16 LAB — POCT URINALYSIS DIPSTICK OB
Ketones, UA: NEGATIVE
Leukocytes, UA: NEGATIVE
Nitrite, UA: NEGATIVE
POC,PROTEIN,UA: NEGATIVE

## 2024-06-16 MED ORDER — FLUCONAZOLE 150 MG PO TABS: ORAL_TABLET | ORAL | 1 refills | Status: AC

## 2024-06-16 NOTE — Progress Notes (Signed)
" ° °  NURSE VISIT- UTI SYMPTOMS/Vaginitis screen  SUBJECTIVE:  Joan Webb is a 54 y.o. [id number] female here for UTI symptoms. She is a GYN patient. She reports abnormal smell to urine sweet. Blood sugar has been in the 400's recently. Also requesting vaginal swab as she is having vaginal itching and burning.   OBJECTIVE:  There were no vitals taken for this visit.  Appears well, in no apparent distress  Results for orders placed or performed in visit on 06/16/24 (from the past 24 hours)  POC Urinalysis Dipstick OB   Collection Time: 06/16/24  2:24 PM  Result Value Ref Range   Color, UA     Clarity, UA     Glucose, UA Large (3+) (A) Negative   Bilirubin, UA     Ketones, UA neg    Spec Grav, UA     Blood, UA trace    pH, UA     POC,PROTEIN,UA Negative Negative, Trace, Small (1+), Moderate (2+), Large (3+), 4+   Urobilinogen, UA     Nitrite, UA neg    Leukocytes, UA Negative Negative   Appearance     Odor      ASSESSMENT: GYN patient with UTI symptoms and negative nitrites CV swab collected  PLAN: Note routed to Delon Lewis, NP   Rx sent by provider today: Yes, patient requesting Diflucan  for possible yeast infection due to elevated blood sugars Urine culture sent CV swab collected Call or return to clinic prn if these symptoms worsen or fail to improve as anticipated. Follow-up: as needed   Rutherford Rover  06/16/2024 2:29 PM  "

## 2024-06-16 NOTE — Progress Notes (Signed)
 Rx diflucan .

## 2024-06-17 ENCOUNTER — Ambulatory Visit: Admitting: Cardiovascular Disease

## 2024-06-17 ENCOUNTER — Ambulatory Visit: Attending: Cardiovascular Disease | Admitting: Cardiovascular Disease

## 2024-06-17 VITALS — BP 100/54 | HR 85 | Ht 62.0 in | Wt 178.0 lb

## 2024-06-17 DIAGNOSIS — Z720 Tobacco use: Secondary | ICD-10-CM

## 2024-06-17 DIAGNOSIS — I2510 Atherosclerotic heart disease of native coronary artery without angina pectoris: Secondary | ICD-10-CM | POA: Diagnosis not present

## 2024-06-17 DIAGNOSIS — E785 Hyperlipidemia, unspecified: Secondary | ICD-10-CM

## 2024-06-17 LAB — CERVICOVAGINAL ANCILLARY ONLY
Bacterial Vaginitis (gardnerella): NEGATIVE
Candida Glabrata: NEGATIVE
Candida Vaginitis: POSITIVE — AB
Chlamydia: NEGATIVE
Comment: NEGATIVE
Comment: NEGATIVE
Comment: NEGATIVE
Comment: NEGATIVE
Comment: NEGATIVE
Comment: NORMAL
Neisseria Gonorrhea: NEGATIVE
Trichomonas: NEGATIVE

## 2024-06-17 LAB — URINALYSIS, ROUTINE W REFLEX MICROSCOPIC
Bilirubin, UA: NEGATIVE
Ketones, UA: NEGATIVE
Leukocytes,UA: NEGATIVE
Nitrite, UA: NEGATIVE
Protein,UA: NEGATIVE
RBC, UA: NEGATIVE
Urobilinogen, Ur: 0.2 mg/dL (ref 0.2–1.0)
pH, UA: 5.5 (ref 5.0–7.5)

## 2024-06-17 NOTE — Progress Notes (Signed)
 "  Chief Complaint  Patient presents with   Follow-up    CAD   History of Present Illness: 54 yo female with history of CAD s/p CABG, HLD, DM, postoperative CVA and tobacco abuse here today for cardiac follow up. She was admitted to [facility] in November 2020 with an inferior ST elevation MI. Emergent cardiac cath November 2020 with multi-vessel CAD. She underwent 3V CABG (LIMA to LAD, SVG to Diagonal, SVG to PDA) on 03/25/19. Echo 03/24/19 with LVEF=60-65% without significant valve disease. She unfortunately suffered a CVA during the post-operative period and was discharged to inpatient rehab. 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 Plavix  was stopped and she was started on Eliquis . She was seen in our office in November 2025 and c/o chest pressure that was reproducible to touch and some dyspnea. PET CT stress test January 2026 without ischemia. Echo December 2025 with LVEF=50%. No valve disease.   She is here today for follow up. The patient denies any chest pain, dyspnea, palpitations, lower extremity edema, orthopnea, PND, dizziness, near syncope or syncope.    Primary Care Physician: Orpha Yancey LABOR, MD  Past Medical History:  Diagnosis Date   Anxiety    Coronary artery disease    Diabetes (HCC)    Hyperlipidemia    Myocardial infarction (HCC)    Tobacco abuse     Past Surgical History:  Procedure Laterality Date   CARDIAC CATHETERIZATION     CORONARY ARTERY BYPASS GRAFT N/A 03/25/2019   Procedure: CORONARY ARTERY BYPASS GRAFTING (CABG) X THREE, ON PUMP, USING LEFT INTERNAL MAMMARY ARTERY AND LEFT GREATER SAPHENOUS VEIN HARVESTED ENDOSCOPICALLY;  Surgeon: Fleeta Hanford Coy, MD;  Location: [facility] OR;  Service: Open Heart Surgery;  Laterality: N/A;   LEFT HEART CATH AND CORONARY ANGIOGRAPHY N/A 03/23/2019   Procedure: LEFT HEART CATH AND CORONARY ANGIOGRAPHY;  Surgeon: Verlin Lonni BIRCH, MD;  Location: [facility] INVASIVE CV LAB;   Service: Cardiovascular;  Laterality: N/A;   None     TEE WITHOUT CARDIOVERSION N/A 03/25/2019   Procedure: TRANSESOPHAGEAL ECHOCARDIOGRAM (TEE);  Surgeon: Fleeta Hanford, Coy, MD;  Location: [facility] OR;  Service: Open Heart Surgery;  Laterality: N/A;    Current Outpatient Medications  Medication Sig Dispense Refill   acetaminophen  (TYLENOL ) 325 MG tablet Take 1-2 tablets (325-650 mg total) by mouth every 4 (four) hours as needed for mild pain.     aspirin  EC 81 MG EC tablet Take 1 tablet (81 mg total) by mouth daily.     atorvastatin  (LIPITOR ) 80 MG tablet Take 1 tablet (80 mg total) by mouth daily at 6 PM. 30 tablet 1   BIOTIN PO Take by mouth.     buPROPion  (WELLBUTRIN  SR) 150 MG 12 hr tablet Take 150 mg by mouth 2 (two) times daily.     Cholecalciferol (QC VITAMIN D3) 125 MCG (5000 UT) TABS Take 1 tablet (5,000 Units total) by mouth daily. 30 tablet 1   citalopram (CELEXA) 40 MG tablet Take 40 mg by mouth daily.     clopidogrel  (PLAVIX ) 75 MG tablet Take 1 tablet (75 mg total) by mouth daily. 90 tablet 3   Cyanocobalamin (VITAMIN B-12 PO) Take by mouth.     fluconazole  (DIFLUCAN ) 150 MG tablet Take 1 now and 1 in 3 days 2 tablet 1   gabapentin  (NEURONTIN ) 600 MG tablet Take 1 tablet (600 mg total) by mouth 3 (three) times daily. 90 tablet 1  glipiZIDE  (GLUCOTROL ) 10 MG tablet Take 1 tablet (10 mg total) by mouth 2 (two) times daily before a meal. 60 tablet 1   JARDIANCE 10 MG TABS tablet Take 10 mg by mouth every morning.     metoprolol  tartrate (LOPRESSOR ) 25 MG tablet Take 1 tablet (25 mg total) by mouth 2 (two) times daily. 180 tablet 3   metroNIDAZOLE  (METROGEL ) 0.75 % vaginal gel Place 1 Applicatorful vaginally at bedtime. 70 g 0   nitrofurantoin , macrocrystal-monohydrate, (MACROBID ) 100 MG capsule Take 1 capsule by mouth twice daily 14 capsule 0   Omega-3 Fatty Acids (FISH OIL PO) Take by mouth.     OZEMPIC, 1 MG/DOSE, 4 MG/3ML SOPN Inject into the skin.     VITAMIN E PO Take by mouth.      nicotine  (NICODERM CQ  - DOSED IN MG/24 HOURS) 21 mg/24hr patch Place 1 patch (21 mg total) onto the skin daily. (Patient not taking: Reported on 06/17/2024) 30 patch 5   nicotine  polacrilex (COMMIT) 4 MG lozenge Take 1 lozenge (4 mg total) by mouth as needed for smoking cessation. (Patient not taking: Reported on 06/17/2024) 100 tablet 0   No current facility-administered medications for this visit.    No Known Allergies  Social History   Socioeconomic History   Marital status: Unknown    Spouse name: Not on file   Number of children: Not on file   Years of education: Not on file   Highest education level: Not on file  Occupational History   Not on file  Tobacco Use   Smoking status: Every Day    Types: Cigarettes   Smokeless tobacco: Never   Tobacco comments:    1/2-1 ppd.  Trying to quit.  Has tried patches.  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  Vaping Use   Vaping status: Never Used  Substance and Sexual Activity   Alcohol use: Not Currently   Drug use: Not Currently   Sexual activity: Yes    Birth control/protection: Post-menopausal  Other Topics Concern   Not on file  Social History Narrative   Not on file   Social Drivers of Health   Tobacco Use: High Risk (06/16/2024)   Patient History    Smoking Tobacco Use: Every Day    Smokeless Tobacco Use: Never    Passive Exposure: Not on file  Financial Resource Strain: Low Risk (01/06/2023)   Overall Financial Resource Strain (CARDIA)    Difficulty of Paying Living Expenses: Not very hard  Food Insecurity: No Food Insecurity (01/06/2023)   Hunger Vital Sign    Worried About Running Out of Food in the Last Year: Never true    Ran Out of Food in the Last Year: Never true  Transportation Needs: No Transportation Needs (01/06/2023)   PRAPARE - Administrator, Civil Service (Medical): No    Lack of Transportation (Non-Medical): No  Physical Activity: Sufficiently Active (01/06/2023)   Exercise Vital Sign    Days of Exercise per  Week: 3 days    Minutes of Exercise per Session: 60 min  Stress: Stress Concern Present (07/25/2021)   [employer] of Occupational Health - Occupational Stress Questionnaire    Feeling of Stress : Very much  Social Connections: Unknown (10/01/2021)   Received from [facility]   Social Network    Social Network: Not on file  Recent Concern: Social Connections - Socially Isolated (07/25/2021)   Social Connection and Isolation Panel    Frequency of Communication with Friends and Family: More  than three times a week    Frequency of Social Gatherings with Friends and Family: Three times a week    Attends Religious Services: Never    Active Member of Clubs or Organizations: No    Attends Banker Meetings: Never    Marital Status: Divorced  Catering Manager Violence: Unknown (08/23/2021)   Received from [facility]   HITS    Physically Hurt: Not on file    Insult or Talk Down To: Not on file    Threaten Physical Harm: Not on file    Scream or Curse: Not on file  Depression (PHQ2-9): High Risk (07/25/2021)   Depression (PHQ2-9)    PHQ-2 Score: 21  Alcohol Screen: Low Risk (07/25/2021)   Alcohol Screen    Last Alcohol Screening Score (AUDIT): 0  Housing: Low Risk (01/06/2023)   Housing    Last Housing Risk Score: 0  Utilities: Not on file  Health Literacy: Not on file    Family History  Problem Relation Age of Onset   Heart attack Father     Review of Systems:  As stated in the HPI and otherwise negative.   BP (!) 100/54 (BP Location: Left Arm, Patient Position: Sitting, Cuff Size: Normal)   Pulse 85   Ht 5' 2 (1.575 m)   Wt 178 lb (80.7 kg)   SpO2 97%   BMI 32.56 kg/m   Physical Examination: General: Well developed, well nourished, NAD  SKIN: warm, dry. Neuro: No focal deficits  Psychiatric: Mood and affect normal  Neck: No JVD Lungs:Clear bilaterally, no wheezes, rhonci, crackles Cardiovascular: Regular rate and rhythm. No murmurs, gallops or  rubs. Abdomen:Soft.  Extremities: No lower extremity edema.    EKG:  EKG is not ordered today. The ekg ordered today demonstrates   Recent Labs: 04/18/2024: BUN 22; Creatinine, Ser 0.65; Hemoglobin 16.7; Magnesium  2.1; Platelets 373; Potassium 4.5; Sodium 137; TSH 1.150   Lipid Panel    Component Value Date/Time   CHOL 112 03/28/2019 0408   TRIG 114 03/28/2019 0408   HDL 21 (L) 03/28/2019 0408   CHOLHDL 5.3 03/28/2019 0408   VLDL 23 03/28/2019 0408   LDLCALC 68 03/28/2019 0408     Wt Readings from Last 3 Encounters:  06/17/24 178 lb (80.7 kg)  04/26/24 178 lb 9.6 oz (81 kg)  04/11/24 177 lb 3.2 oz (80.4 kg)    Assessment and Plan:   1. CAD s/p CABG without angina: No chest pain. PET CT stress January 2026 without ischemia. Echo December 2025 with LVEF=50%. Will plan to continue DAPT with ASA and Plavix  given her CAD and stroke.  -Continue ASA, Lipitor  and Lopressor .     2. Hyperlipidemia: Lipids followed in primary care.  -Continue Lipitor   3. Tobacco abuse: Smoking cessation counseling provided.   Labs/ tests ordered today include:  No orders of the defined types were placed in this encounter.  Disposition:   F/U with me in 12  months  Signed, Lonni Cash, MD 06/17/2024 2:39 PM    [facility] [address] [place], [place], [place]  [address] Phone: [phone]; Fax: [phone]   "

## 2024-06-17 NOTE — Patient Instructions (Signed)

## 2024-06-20 LAB — URINE CULTURE

## 2024-06-21 ENCOUNTER — Ambulatory Visit: Payer: Self-pay | Admitting: Adult Health

## 2024-06-21 MED ORDER — NITROFURANTOIN MONOHYD MACRO 100 MG PO CAPS: 100.0000 mg | ORAL_CAPSULE | Freq: Two times a day (BID) | ORAL | 0 refills | Status: AC

## 2024-07-12 ENCOUNTER — Ambulatory Visit: Admitting: Obstetrics & Gynecology
